# Patient Record
Sex: Male | Born: 1975 | Race: Black or African American | Hispanic: No | Marital: Married | Smoking: Current every day smoker
Health system: Southern US, Community
[De-identification: ages and names within clinical notes are randomized; demographics above are authoritative.]

## PROBLEM LIST (undated history)

## (undated) DIAGNOSIS — K8020 Calculus of gallbladder without cholecystitis without obstruction: Secondary | ICD-10-CM

## (undated) DIAGNOSIS — F4310 Post-traumatic stress disorder, unspecified: Secondary | ICD-10-CM

## (undated) DIAGNOSIS — K760 Fatty (change of) liver, not elsewhere classified: Secondary | ICD-10-CM

## (undated) DIAGNOSIS — B019 Varicella without complication: Secondary | ICD-10-CM

## (undated) DIAGNOSIS — R030 Elevated blood-pressure reading, without diagnosis of hypertension: Principal | ICD-10-CM

## (undated) DIAGNOSIS — K8050 Calculus of bile duct without cholangitis or cholecystitis without obstruction: Secondary | ICD-10-CM

## (undated) DIAGNOSIS — F4321 Adjustment disorder with depressed mood: Secondary | ICD-10-CM

## (undated) DIAGNOSIS — I10 Essential (primary) hypertension: Secondary | ICD-10-CM

## (undated) DIAGNOSIS — E039 Hypothyroidism, unspecified: Secondary | ICD-10-CM

## (undated) HISTORY — PX: HYDROCELE EXCISION / REPAIR: SUR1145

## (undated) HISTORY — PX: FOOT SURGERY: SHX648

## (undated) HISTORY — DX: Hypothyroidism, unspecified: E03.9

## (undated) HISTORY — DX: Essential (primary) hypertension: I10

## (undated) HISTORY — DX: Varicella without complication: B01.9

## (undated) HISTORY — DX: Elevated blood-pressure reading, without diagnosis of hypertension: R03.0

## (undated) HISTORY — DX: Adjustment disorder with depressed mood: F43.21

---

## 2008-01-25 HISTORY — PX: WISDOM TOOTH EXTRACTION: SHX21

## 2011-02-08 ENCOUNTER — Encounter (HOSPITAL_BASED_OUTPATIENT_CLINIC_OR_DEPARTMENT_OTHER): Payer: Self-pay | Admitting: *Deleted

## 2011-02-08 ENCOUNTER — Emergency Department (HOSPITAL_BASED_OUTPATIENT_CLINIC_OR_DEPARTMENT_OTHER)
Admission: EM | Admit: 2011-02-08 | Discharge: 2011-02-08 | Disposition: A | Discharge status: Home / Self Care | Payer: 59 | Attending: Emergency Medicine | Admitting: Emergency Medicine

## 2011-02-08 DIAGNOSIS — H018 Other specified inflammations of eyelid: Secondary | ICD-10-CM | POA: Insufficient documentation

## 2011-02-08 DIAGNOSIS — L089 Local infection of the skin and subcutaneous tissue, unspecified: Secondary | ICD-10-CM

## 2011-02-08 DIAGNOSIS — L723 Sebaceous cyst: Secondary | ICD-10-CM | POA: Insufficient documentation

## 2011-02-08 DIAGNOSIS — R220 Localized swelling, mass and lump, head: Secondary | ICD-10-CM | POA: Insufficient documentation

## 2011-02-08 DIAGNOSIS — H5710 Ocular pain, unspecified eye: Secondary | ICD-10-CM | POA: Insufficient documentation

## 2011-02-08 MED ORDER — NAPROXEN 500 MG PO TABS: 500.0000 mg | ORAL_TABLET | Freq: Two times a day (BID) | ORAL | Status: AC

## 2011-02-08 MED ORDER — LIDOCAINE-EPINEPHRINE 2 %-1:100000 IJ SOLN
INTRAMUSCULAR | Status: AC
  Administered 2011-02-08: 07:00:00
  Filled 2011-02-08: qty 1

## 2011-02-08 NOTE — ED Notes (Signed)
Pt has red swollen area above left eye x 1 week, denies drainage

## 2011-02-08 NOTE — ED Provider Notes (Signed)
History     CSN: [id number]  Arrival date & time 02/08/11  0610   First MD Initiated Contact with Patient 02/08/11 [phone]      Chief Complaint  Patient presents with  . Eye Pain    (Consider location/radiation/quality/duration/timing/severity/associated sxs/prior treatment) HPI Comments: 36 year old male with history of sebaceous cysts in his right ear and his left eyelid. These have been present for years. Approximately 3 weeks ago the right earlobe became infected and required incision and drainage at an outside hospital. The last week he developed gradual onset of gradually worsening increased redness and swelling of the left eyelid which has been tender, gradually increasing in size and increasing in pain. Worse with palpation, not associated with fevers.  Patient is a 36 y.o. male presenting with eye pain. The history is provided by the patient.  Eye Pain Pertinent negatives include no headaches.    History reviewed. No pertinent past medical history.  History reviewed. No pertinent past surgical history.  History reviewed. No pertinent family history.  History  Substance Use Topics  . Smoking status: Former Games developer  . Smokeless tobacco: Not on file  . Alcohol Use: No      Review of Systems  Constitutional: Negative for fever.  HENT: Positive for facial swelling. Negative for sore throat and neck pain.   Eyes: Negative for visual disturbance.  Gastrointestinal: Negative for nausea and vomiting.  Skin:       Abscess  Neurological: Negative for headaches.    Allergies  Review of patient's allergies indicates no known allergies.  Home Medications   Current Outpatient Rx  Name Route Sig Dispense Refill  . NAPROXEN 500 MG PO TABS Oral Take 1 tablet (500 mg total) by mouth 2 (two) times daily with a meal. 30 tablet 0    BP 161/97  Pulse 71  Temp(Src) 98.2 F (36.8 C) (Oral)  Resp 20  Ht 6\' 2"  (1.88 m)  Wt 275 lb (124.739 kg)  BMI 35.31 kg/m2  SpO2  100%  Physical Exam  Constitutional: He appears well-developed and well-nourished. No distress.  HENT:  Head: Normocephalic and atraumatic.  Mouth/Throat: Oropharynx is clear and moist. No oropharyngeal exudate.       Left upper eyelid with what appears to be an infected sebaceous cyst, mild redness, no surrounding erythema of the skin, mild tenderness and fluctuance present  Eyes: Conjunctivae are normal. Right eye exhibits no discharge. Left eye exhibits no discharge. No scleral icterus.       When eyelid opens, normal extraocular movements, vision, pupillary exam bilaterally  Cardiovascular: Normal rate, regular rhythm and normal heart sounds.   Pulmonary/Chest: Effort normal. He has no wheezes. He has no rales.  Musculoskeletal: He exhibits no edema and no tenderness.  Skin: Skin is warm and dry. He is not diaphoretic. There is erythema.       Erythema only involves on the surface of the infected sebaceous cyst    ED Course  Procedures (including critical care time)  INCISION AND DRAINAGE Performed by: Eber Hong D Consent: Verbal consent obtained. Risks and benefits: risks, benefits and alternatives were discussed Type: abscess  Body area: Left upper eyelid, face below eyebrow  Anesthesia: local infiltration  Local anesthetic: lidocaine 1 % with epinephrine  Anesthetic total: 2 ml placed as a supraorbital block  Complexity: complex Blunt dissection to break up loculations and manual removal of infected sebaceous waxy-like material him a drainage of purulent foul-smelling pus   Drainage: purulent  Drainage amount: Mild to  moderate   Packing material: 1/4 in iodoform gauze  Patient tolerance: Patient tolerated the procedure well with no immediate complications.     Labs Reviewed - No data to display No results found.   1. Infected sebaceous cyst       MDM  Patient tolerated procedure without any complaints and no pain. No antibiotics indicated, packing  gauze left in place with instruction for 3 days followup.  No antibiotics indicated at this time to        Vida Roller, MD 02/08/11 [phone]

## 2011-02-08 NOTE — ED Notes (Signed)
I&D performed by Dr Hyacinth Meeker, pt tolerated well. Dressing applied per MD order.

## 2011-02-08 NOTE — ED Notes (Signed)
MD at bedside. 

## 2013-04-04 ENCOUNTER — Encounter: Payer: Self-pay | Admitting: Family Medicine

## 2013-04-04 ENCOUNTER — Ambulatory Visit (INDEPENDENT_AMBULATORY_CARE_PROVIDER_SITE_OTHER): Payer: BC Managed Care – PPO | Admitting: Family Medicine

## 2013-04-04 ENCOUNTER — Telehealth: Payer: Self-pay | Admitting: Family Medicine

## 2013-04-04 VITALS — BP 164/98 | HR 85 | Temp 98.2°F | Ht 74.0 in | Wt 275.0 lb

## 2013-04-04 DIAGNOSIS — E039 Hypothyroidism, unspecified: Secondary | ICD-10-CM

## 2013-04-04 DIAGNOSIS — G4700 Insomnia, unspecified: Secondary | ICD-10-CM

## 2013-04-04 DIAGNOSIS — I10 Essential (primary) hypertension: Secondary | ICD-10-CM

## 2013-04-04 DIAGNOSIS — F4321 Adjustment disorder with depressed mood: Secondary | ICD-10-CM

## 2013-04-04 DIAGNOSIS — E669 Obesity, unspecified: Secondary | ICD-10-CM

## 2013-04-04 DIAGNOSIS — F4320 Adjustment disorder, unspecified: Secondary | ICD-10-CM

## 2013-04-04 DIAGNOSIS — IMO0001 Reserved for inherently not codable concepts without codable children: Secondary | ICD-10-CM

## 2013-04-04 DIAGNOSIS — R030 Elevated blood-pressure reading, without diagnosis of hypertension: Secondary | ICD-10-CM

## 2013-04-04 HISTORY — DX: Adjustment disorder, unspecified: F43.20

## 2013-04-04 HISTORY — DX: Adjustment disorder with depressed mood: F43.21

## 2013-04-04 HISTORY — DX: Reserved for inherently not codable concepts without codable children: IMO0001

## 2013-04-04 HISTORY — DX: Essential (primary) hypertension: I10

## 2013-04-04 LAB — LIPID PANEL
CHOL/HDL RATIO: 3.2 ratio
CHOLESTEROL: 163 mg/dL (ref 0–200)
HDL: 51 mg/dL (ref >39)
LDL Cholesterol: 67 mg/dL (ref 0–99)
Triglycerides: 223 mg/dL — ABNORMAL HIGH (ref <150)
VLDL: 45 mg/dL — ABNORMAL HIGH (ref 0–40)

## 2013-04-04 LAB — RENAL FUNCTION PANEL
ALBUMIN: 4.3 g/dL (ref 3.5–5.2)
BUN: 12 mg/dL (ref 6–23)
CALCIUM: 9.1 mg/dL (ref 8.4–10.5)
CO2: 26 mEq/L (ref 19–32)
CREATININE: 1.02 mg/dL (ref 0.50–1.35)
Chloride: 104 mEq/L (ref 96–112)
GLUCOSE: 102 mg/dL — AB (ref 70–99)
Phosphorus: 3.2 mg/dL (ref 2.3–4.6)
Potassium: 3.8 mEq/L (ref 3.5–5.3)
SODIUM: 141 meq/L (ref 135–145)

## 2013-04-04 LAB — CBC
HCT: 44.8 % (ref 39.0–52.0)
HEMOGLOBIN: 15.3 g/dL (ref 13.0–17.0)
MCH: 28.2 pg (ref 26.0–34.0)
MCHC: 34.2 g/dL (ref 30.0–36.0)
MCV: 82.7 fL (ref 78.0–100.0)
PLATELETS: 267 10*3/uL (ref 150–400)
RBC: 5.42 MIL/uL (ref 4.22–5.81)
RDW: 15.4 % (ref 11.5–15.5)
WBC: 11.6 10*3/uL — ABNORMAL HIGH (ref 4.0–10.5)

## 2013-04-04 LAB — HEPATIC FUNCTION PANEL
ALBUMIN: 4.3 g/dL (ref 3.5–5.2)
ALT: 32 U/L (ref 0–53)
AST: 30 U/L (ref 0–37)
Alkaline Phosphatase: 57 U/L (ref 39–117)
BILIRUBIN DIRECT: 0.1 mg/dL (ref 0.0–0.3)
Indirect Bilirubin: 0.3 mg/dL (ref 0.2–1.2)
Total Bilirubin: 0.4 mg/dL (ref 0.2–1.2)
Total Protein: 7.2 g/dL (ref 6.0–8.3)

## 2013-04-04 LAB — TSH: TSH: 26.499 u[IU]/mL — AB (ref 0.350–4.500)

## 2013-04-04 MED ORDER — CARVEDILOL 3.125 MG PO TABS: 3.1250 mg | ORAL_TABLET | Freq: Two times a day (BID) | ORAL | Status: DC

## 2013-04-04 MED ORDER — ALPRAZOLAM 0.25 MG PO TABS: 0.2500 mg | ORAL_TABLET | Freq: Two times a day (BID) | ORAL | Status: DC | PRN

## 2013-04-04 NOTE — Patient Instructions (Signed)
Hypertension As your heart beats, it forces blood through your arteries. This force is your blood pressure. If the pressure is too high, it is called hypertension (HTN) or high blood pressure. HTN is dangerous because you may have it and not know it. High blood pressure may mean that your heart has to work harder to pump blood. Your arteries may be narrow or stiff. The extra work puts you at risk for heart disease, stroke, and other problems.  Blood pressure consists of two numbers, a higher number over a lower, 110/72, for example. It is stated as "110 over 72." The ideal is below 120 for the top number (systolic) and under 80 for the bottom (diastolic). Write down your blood pressure today. You should pay close attention to your blood pressure if you have certain conditions such as:  Heart failure.  Prior heart attack.  Diabetes  Chronic kidney disease.  Prior stroke.  Multiple risk factors for heart disease. To see if you have HTN, your blood pressure should be measured while you are seated with your arm held at the level of the heart. It should be measured at least twice. A one-time elevated blood pressure reading (especially in the Emergency Department) does not mean that you need treatment. There may be conditions in which the blood pressure is different between your right and left arms. It is important to see your caregiver soon for a recheck. Most people have essential hypertension which means that there is not a specific cause. This type of high blood pressure may be lowered by changing lifestyle factors such as:  Stress.  Smoking.  Lack of exercise.  Excessive weight.  Drug/tobacco/alcohol use.  Eating less salt. Most people do not have symptoms from high blood pressure until it has caused damage to the body. Effective treatment can often prevent, delay or reduce that damage. TREATMENT  When a cause has been identified, treatment for high blood pressure is directed at the  cause. There are a large number of medications to treat HTN. These fall into several categories, and your caregiver will help you select the medicines that are best for you. Medications may have side effects. You should review side effects with your caregiver. If your blood pressure stays high after you have made lifestyle changes or started on medicines,   Your medication(s) may need to be changed.  Other problems may need to be addressed.  Be certain you understand your prescriptions, and know how and when to take your medicine.  Be sure to follow up with your caregiver within the time frame advised (usually within two weeks) to have your blood pressure rechecked and to review your medications.  If you are taking more than one medicine to lower your blood pressure, make sure you know how and at what times they should be taken. Taking two medicines at the same time can result in blood pressure that is too low. SEEK IMMEDIATE MEDICAL CARE IF:  You develop a severe headache, blurred or changing vision, or confusion.  You have unusual weakness or numbness, or a faint feeling.  You have severe chest or abdominal pain, vomiting, or breathing problems. MAKE SURE YOU:   Understand these instructions.  Will watch your condition.  Will get help right away if you are not doing well or get worse. Document Released: 01/10/2005 Document Revised: 04/04/2011 Document Reviewed: 08/31/2007 [facility] Patient Information 2014 [place]. Grief Reaction Grief is a normal response to the death of someone close to you. Feelings  of fear, anger, and guilt can affect almost everyone who loses someone they love. Symptoms of depression are also common. These include problems with sleep, loss of appetite, and lack of energy. These grief reaction symptoms often last for weeks to months after a loss. They may also return during special times that remind you of the person you lost, such as an anniversary or  birthday. Anxiety, insomnia, irritability, and deep depression may last beyond the period of normal grief. If you experience these feelings for 6 months or longer, you may have clinical depression. Clinical depression requires further medical attention. If you think that you have clinical depression, you should contact your caregiver. If you have a history of depression and or a family history of depression, you are at greater risk of clinical depression. You are also at greater risk of developing clinical depression if the loss was traumatic or the loss was of someone with whom you had unresolved issues.  A grief reaction can become complicated by being blocked. This means being unable to cry or express extreme emotions. This may prolong the grieving period and worsen the emotional effects of the loss. Mourning is a natural event in human life. A healthy grief reaction is one that is not blocked . It requires a time of sadness and readjustment.It is very important to share your sorrow and fear with others, especially close friends and family. Professional counselors and clergy can also help you process your grief. Document Released: 01/10/2005 Document Revised: 04/04/2011 Document Reviewed: 09/20/2005 [facility] Patient Information 2014 [place], [place].

## 2013-04-04 NOTE — Telephone Encounter (Signed)
Patient states that he will pick up form at office when completed.

## 2013-04-04 NOTE — Progress Notes (Signed)
Pre visit review using our clinic review tool, if applicable. No additional management support is needed unless otherwise documented below in the visit note. 

## 2013-04-05 MED ORDER — LEVOTHYROXINE SODIUM 50 MCG PO TABS: 50.0000 ug | ORAL_TABLET | Freq: Every day | ORAL | Status: DC

## 2013-04-05 NOTE — Telephone Encounter (Signed)
Pt informed and he voiced understanding.  RX sent to pharmacy

## 2013-04-05 NOTE — Telephone Encounter (Signed)
Message copied by Varney Daily on Fri Apr 05, 2013 12:11 PM ------      Message from: Penni Homans A      Created: Thu Apr 04, 2013  5:36 PM       Notify thyroid is off majorly, needs to start Levothyroxine 50 mg po daily, disp #30, 2 rf ------

## 2013-04-07 ENCOUNTER — Encounter: Payer: Self-pay | Admitting: Family Medicine

## 2013-04-07 DIAGNOSIS — E669 Obesity, unspecified: Secondary | ICD-10-CM | POA: Insufficient documentation

## 2013-04-07 DIAGNOSIS — E039 Hypothyroidism, unspecified: Secondary | ICD-10-CM

## 2013-04-07 DIAGNOSIS — G4700 Insomnia, unspecified: Secondary | ICD-10-CM | POA: Insufficient documentation

## 2013-04-07 HISTORY — DX: Hypothyroidism, unspecified: E03.9

## 2013-04-07 NOTE — Assessment & Plan Note (Signed)
Encouraged DASH diet, decrease po intake and increase exercise as tolerated. Needs 7-8 hours of sleep nightly. Avoid trans fats, eat small, frequent meals every 4-5 hours with lean proteins, complex carbs and healthy fats. Minimize simple carbs, GMO foods. 

## 2013-04-07 NOTE — Assessment & Plan Note (Signed)
Girlfriend and her son were murdered recently and he is distraught today. Is allowed to stay out of work til next visit. Encouraged regular exercise, counseling, healthy diet and may use Alprazolam sparingly.

## 2013-04-07 NOTE — Progress Notes (Signed)
Patient ID: Tony Walters, male   DOB: 26-Dec-1975, 38 y.o.   MRN: [id number] Tony Walters [id number] 12/07/1975 04/07/2013      Progress Note New Patient  Subjective  Chief Complaint  Chief Complaint  Patient presents with  . Establish Care    new patient    HPI  Patient is a 38 year old male in today for routine medical care. This is an urgent new patient appt due to a recent tragedy in his life. His girlfriend and her son of one year. Were found dead in a burning car. He is tearful and has had trouble since then with crying, difficulty concentrating, anxiety attacks. He has been working with great difficulty. When he has anxiety attacks he feels sob/shakey, nauseaous, anxious, chest pain and palpitations. He denies a previous history of depression. Denies HA/congestion/fevers/GI or GU c/o. Taking meds as prescribed  Past Medical History  Diagnosis Date  . Chicken pox as a child  . Elevated BP 04/04/2013  . Grief reaction 04/04/2013  . Unspecified hypothyroidism 04/07/2013    Past Surgical History  Procedure Laterality Date  . Hydrocele excision / repair  37 years old  . Wisdom tooth extraction  2010    Family History  Problem Relation Age of Onset  . Congestive Heart Failure Father   . Hyperlipidemia Father   . Hypertension Father   . Kidney disease Maternal Grandmother   . Diabetes Maternal Grandmother     type 2  . Stroke Maternal Grandmother   . Alcohol abuse Paternal Grandmother   . Pneumonia Paternal Grandfather   . Asthma Brother     History   Social History  . Marital Status: Single    Spouse Name: N/A    Number of Children: N/A  . Years of Education: N/A   Occupational History  . Not on file.   Social History Main Topics  . Smoking status: Former Smoker -- 20.00 packs/day for .8 years    Types: Cigarettes  . Smokeless tobacco: Never Used     Comment: quit in Nov 2014 but started back 03-18-13  . Alcohol Use: Yes     Comment: occasionally  . Drug  Use: No  . Sexual Activity: Not Currently    Partners: Female   Other Topics Concern  . Not on file   Social History Narrative  . No narrative on file    No current outpatient prescriptions on file prior to visit.   No current facility-administered medications on file prior to visit.    No Known Allergies  Review of Systems  Review of Systems  Constitutional: Positive for malaise/fatigue. Negative for fever and chills.  HENT: Negative for congestion, hearing loss and nosebleeds.   Eyes: Negative for discharge.  Respiratory: Negative for cough, sputum production, shortness of breath and wheezing.   Cardiovascular: Negative for chest pain, palpitations and leg swelling.  Gastrointestinal: Negative for heartburn, nausea, vomiting, abdominal pain, diarrhea, constipation and blood in stool.  Genitourinary: Negative for dysuria, urgency, frequency and hematuria.  Musculoskeletal: Negative for back pain, falls and myalgias.  Skin: Negative for rash.  Neurological: Negative for dizziness, tremors, sensory change, focal weakness, loss of consciousness, weakness and headaches.  Endo/Heme/Allergies: Negative for polydipsia. Does not bruise/bleed easily.  Psychiatric/Behavioral: Positive for depression. Negative for suicidal ideas. The patient is nervous/anxious and has insomnia.     Objective  BP 164/98  Pulse 85  Temp(Src) 98.2 F (36.8 C) (Oral)  Ht 6\' 2"  (1.88 m)  Wt  275 lb 0.6 oz (124.757 kg)  BMI 35.30 kg/m2  SpO2 97%  Physical Exam  Physical Exam  Constitutional: He is oriented to person, place, and time and well-developed, well-nourished, and in no distress. No distress.  HENT:  Head: Normocephalic and atraumatic.  Eyes: Conjunctivae are normal.  Neck: Neck supple. No thyromegaly present.  Cardiovascular: Normal rate, regular rhythm and normal heart sounds.   No murmur heard. Pulmonary/Chest: Effort normal and breath sounds normal. No respiratory distress.   Abdominal: He exhibits no distension and no mass. There is no tenderness.  Musculoskeletal: He exhibits no edema.  Neurological: He is alert and oriented to person, place, and time.  Skin: Skin is warm.  Psychiatric: Memory, affect and judgment normal.       Assessment & Plan  Elevated BP Poorly controlled will alter medications, encouraged DASH diet, minimize caffeine and obtain adequate sleep. Report concerning symptoms and follow up as directed and as needed. Carvedilol bid  Grief reaction Girlfriend and her son were murdered recently and he is distraught today. Is allowed to stay out of work til next visit. Encouraged regular exercise, counseling, healthy diet and may use Alprazolam sparingly.  Obesity, unspecified Encouraged DASH diet, decrease po intake and increase exercise as tolerated. Needs 7-8 hours of sleep nightly. Avoid trans fats, eat small, frequent meals every 4-5 hours with lean proteins, complex carbs and healthy fats. Minimize simple carbs, GMO foods.  Unspecified hypothyroidism Significantly increased TSH, started on Levothyroxine.

## 2013-04-07 NOTE — Assessment & Plan Note (Signed)
Poorly controlled will alter medications, encouraged DASH diet, minimize caffeine and obtain adequate sleep. Report concerning symptoms and follow up as directed and as needed. Carvedilol bid

## 2013-04-07 NOTE — Assessment & Plan Note (Signed)
Significantly increased TSH, started on Levothyroxine.

## 2013-04-15 ENCOUNTER — Telehealth: Payer: Self-pay

## 2013-04-15 NOTE — Telephone Encounter (Signed)
This was put in the fax pile and Dr Charlett Blake wrote on the paper that page 5 is for the patient to fill out.

## 2013-04-15 NOTE — Telephone Encounter (Signed)
We need to know when pts last date of work was and date pt was first unable to work.   I left a message on pts vm to return our call

## 2013-04-15 NOTE — Telephone Encounter (Signed)
Per pt his last day of work was 04-05-13, date unable to work 04-06-13. Date to return to work 04-29-13

## 2013-04-18 ENCOUNTER — Telehealth: Payer: Self-pay | Admitting: Family Medicine

## 2013-04-18 ENCOUNTER — Ambulatory Visit (INDEPENDENT_AMBULATORY_CARE_PROVIDER_SITE_OTHER): Payer: BC Managed Care – PPO | Admitting: Family Medicine

## 2013-04-18 ENCOUNTER — Encounter: Payer: Self-pay | Admitting: Family Medicine

## 2013-04-18 VITALS — BP 150/90 | HR 101 | Temp 98.2°F | Ht 74.0 in | Wt 280.1 lb

## 2013-04-18 DIAGNOSIS — F32A Depression, unspecified: Secondary | ICD-10-CM

## 2013-04-18 DIAGNOSIS — R030 Elevated blood-pressure reading, without diagnosis of hypertension: Secondary | ICD-10-CM

## 2013-04-18 DIAGNOSIS — F419 Anxiety disorder, unspecified: Secondary | ICD-10-CM

## 2013-04-18 DIAGNOSIS — I10 Essential (primary) hypertension: Secondary | ICD-10-CM

## 2013-04-18 DIAGNOSIS — F4320 Adjustment disorder, unspecified: Secondary | ICD-10-CM

## 2013-04-18 DIAGNOSIS — F341 Dysthymic disorder: Secondary | ICD-10-CM

## 2013-04-18 DIAGNOSIS — G4700 Insomnia, unspecified: Secondary | ICD-10-CM

## 2013-04-18 DIAGNOSIS — F4321 Adjustment disorder with depressed mood: Secondary | ICD-10-CM

## 2013-04-18 DIAGNOSIS — E039 Hypothyroidism, unspecified: Secondary | ICD-10-CM

## 2013-04-18 DIAGNOSIS — E669 Obesity, unspecified: Secondary | ICD-10-CM

## 2013-04-18 DIAGNOSIS — F329 Major depressive disorder, single episode, unspecified: Secondary | ICD-10-CM

## 2013-04-18 DIAGNOSIS — IMO0001 Reserved for inherently not codable concepts without codable children: Secondary | ICD-10-CM

## 2013-04-18 MED ORDER — CARVEDILOL 6.25 MG PO TABS: 6.2500 mg | ORAL_TABLET | Freq: Two times a day (BID) | ORAL | Status: DC

## 2013-04-18 MED ORDER — ESCITALOPRAM OXALATE 10 MG PO TABS: ORAL_TABLET | ORAL | Status: DC

## 2013-04-18 MED ORDER — ALPRAZOLAM 0.25 MG PO TABS
0.2500 mg | ORAL_TABLET | Freq: Two times a day (BID) | ORAL | Status: DC | PRN
Start: 2013-04-18 — End: 2013-05-07

## 2013-04-18 NOTE — Telephone Encounter (Signed)
Relevant patient education mailed to patient.  

## 2013-04-18 NOTE — Progress Notes (Signed)
Patient ID: Tony Walters, male   DOB: 22-Dec-1975, 38 y.o.   MRN: [id number] Tony Walters [id number] [birth year] 04/18/2013      Progress Note-Follow Up  Subjective  Chief Complaint  Chief Complaint  Patient presents with  . Follow-up    2 week    HPI  Patient is a 38 year old male in today for routine medical care. Continues to struggle with anxiety and depression secondary to the recent death of his girlfriend and her son. Does say Xanax helped when he was feeling overly anxious but only marginally. No suicidal ideation but he does knowledge his mood is low frequently. He has couple trading he gets anxious very easily. Would not be able to function in the work environment presently. Denies any other physical complaints. Denies CP/palp/SOB/HA/congestion/fevers/GI or GU c/o. Taking meds as prescribed  Past Medical History  Diagnosis Date  . Chicken pox as a child  . Elevated BP 04/04/2013  . Grief reaction 04/04/2013  . Unspecified hypothyroidism 04/07/2013    Past Surgical History  Procedure Laterality Date  . Hydrocele excision / repair  38 years old  . Wisdom tooth extraction  2010    Family History  Problem Relation Age of Onset  . Congestive Heart Failure Father   . Hyperlipidemia Father   . Hypertension Father   . Kidney disease Maternal Grandmother   . Diabetes Maternal Grandmother     type 2  . Stroke Maternal Grandmother   . Alcohol abuse Paternal Grandmother   . Pneumonia Paternal Grandfather   . Asthma Brother     History   Social History  . Marital Status: Single    Spouse Name: N/A    Number of Children: N/A  . Years of Education: N/A   Occupational History  . Not on file.   Social History Main Topics  . Smoking status: Current Every Day Smoker -- 20.00 packs/day for .8 years    Types: Cigarettes  . Smokeless tobacco: Never Used     Comment: quit in Nov 2014 but started back 03-18-13  . Alcohol Use: Yes     Comment: occasionally  . Drug Use: No   . Sexual Activity: Not Currently    Partners: Female   Other Topics Concern  . Not on file   Social History Narrative  . No narrative on file    Current Outpatient Prescriptions on File Prior to Visit  Medication Sig Dispense Refill  . levothyroxine (SYNTHROID, LEVOTHROID) 50 MCG tablet Take 1 tablet (50 mcg total) by mouth daily.  30 tablet  2   No current facility-administered medications on file prior to visit.    No Known Allergies  Review of Systems  Review of Systems  Constitutional: Negative for fever and malaise/fatigue.  HENT: Negative for congestion.   Eyes: Negative for discharge.  Respiratory: Negative for shortness of breath.   Cardiovascular: Negative for chest pain, palpitations and leg swelling.  Gastrointestinal: Negative for nausea, abdominal pain and diarrhea.  Genitourinary: Negative for dysuria.  Musculoskeletal: Negative for falls.  Skin: Negative for rash.  Neurological: Negative for loss of consciousness and headaches.  Endo/Heme/Allergies: Negative for polydipsia.  Psychiatric/Behavioral: Positive for depression. Negative for suicidal ideas. The patient is nervous/anxious and has insomnia.     Objective  BP 150/90  Pulse 101  Temp(Src) 98.2 F (36.8 C) (Oral)  Ht 6\' 2"  (1.88 m)  Wt 280 lb 1.3 oz (127.043 kg)  BMI 35.94 kg/m2  SpO2 96%  Physical Exam  Physical Exam  Lab Results  Component Value Date   TSH 26.499* 04/04/2013   Lab Results  Component Value Date   WBC 11.6* 04/04/2013   HGB 15.3 04/04/2013   HCT 44.8 04/04/2013   MCV 82.7 04/04/2013   PLT 267 04/04/2013   Lab Results  Component Value Date   CREATININE 1.02 04/04/2013   BUN 12 04/04/2013   NA 141 04/04/2013   K 3.8 04/04/2013   CL 104 04/04/2013   CO2 26 04/04/2013   Lab Results  Component Value Date   ALT 32 04/04/2013   AST 30 04/04/2013   ALKPHOS 57 04/04/2013   BILITOT 0.4 04/04/2013   Lab Results  Component Value Date   CHOL 163 04/04/2013   Lab Results   Component Value Date   HDL 51 04/04/2013   Lab Results  Component Value Date   LDLCALC 67 04/04/2013   Lab Results  Component Value Date   TRIG 223* 04/04/2013   Lab Results  Component Value Date   CHOLHDL 3.2 04/04/2013     Assessment & Plan   Elevated BP Improving will increase Carvedilol to 6.25 mg bid.   Grief reaction Found Alprazolam marginally helpful. Is still feeling overwhelmed by recent circumstance. Agrees to start Lexapro 5 to 10 mg daily and may use Alprazolam prn  Insomnia Encouraged good sleep hygiene such as dark, quiet room. No blue/green glowing lights such as computer screens in bedroom. No alcohol or stimulants in evening. Cut down on caffeine as able. Regular exercise is helpful but not just prior to bed time.   Obesity, unspecified Encouraged DASH diet, decrease po intake and increase exercise as tolerated. Needs 7-8 hours of sleep nightly. Avoid trans fats, eat small, frequent meals every 4-5 hours with lean proteins, complex carbs and healthy fats. Minimize simple carbs, GMO foods.  Unspecified hypothyroidism Recently diagnosed, just started on Levothyroxine. Will monitor

## 2013-04-18 NOTE — Progress Notes (Signed)
Pre visit review using our clinic review tool, if applicable. No additional management support is needed unless otherwise documented below in the visit note. 

## 2013-04-21 NOTE — Assessment & Plan Note (Signed)
Encouraged DASH diet, decrease po intake and increase exercise as tolerated. Needs 7-8 hours of sleep nightly. Avoid trans fats, eat small, frequent meals every 4-5 hours with lean proteins, complex carbs and healthy fats. Minimize simple carbs, GMO foods. 

## 2013-04-21 NOTE — Assessment & Plan Note (Signed)
Found Alprazolam marginally helpful. Is still feeling overwhelmed by recent circumstance. Agrees to start Lexapro 5 to 10 mg daily and may use Alprazolam prn

## 2013-04-21 NOTE — Assessment & Plan Note (Signed)
Encouraged good sleep hygiene such as dark, quiet room. No blue/green glowing lights such as computer screens in bedroom. No alcohol or stimulants in evening. Cut down on caffeine as able. Regular exercise is helpful but not just prior to bed time.  

## 2013-04-21 NOTE — Assessment & Plan Note (Signed)
Improving will increase Carvedilol to 6.25 mg bid.

## 2013-04-21 NOTE — Assessment & Plan Note (Signed)
Recently diagnosed, just started on Levothyroxine. Will monitor

## 2013-05-06 ENCOUNTER — Telehealth: Payer: Self-pay

## 2013-05-06 ENCOUNTER — Ambulatory Visit: Payer: BC Managed Care – PPO | Admitting: Family Medicine

## 2013-05-06 NOTE — Telephone Encounter (Signed)
Paperwork refaxed and pt informed

## 2013-05-06 NOTE — Telephone Encounter (Signed)
Patient left a message stating he was supposed to be seen this morning but couldn't make it to appt?  Pt also stated that his job is saying they didn't get the paperwork that him and Dr Charlett Blake filled out?

## 2013-05-07 ENCOUNTER — Ambulatory Visit (INDEPENDENT_AMBULATORY_CARE_PROVIDER_SITE_OTHER): Payer: BC Managed Care – PPO | Admitting: Family Medicine

## 2013-05-07 ENCOUNTER — Encounter: Payer: Self-pay | Admitting: Family Medicine

## 2013-05-07 VITALS — BP 160/120 | HR 75 | Temp 98.6°F | Ht 74.0 in | Wt 265.0 lb

## 2013-05-07 DIAGNOSIS — E669 Obesity, unspecified: Secondary | ICD-10-CM

## 2013-05-07 DIAGNOSIS — I10 Essential (primary) hypertension: Secondary | ICD-10-CM

## 2013-05-07 DIAGNOSIS — F418 Other specified anxiety disorders: Secondary | ICD-10-CM

## 2013-05-07 DIAGNOSIS — F4321 Adjustment disorder with depressed mood: Secondary | ICD-10-CM

## 2013-05-07 DIAGNOSIS — F341 Dysthymic disorder: Secondary | ICD-10-CM

## 2013-05-07 DIAGNOSIS — E039 Hypothyroidism, unspecified: Secondary | ICD-10-CM

## 2013-05-07 MED ORDER — LORAZEPAM 1 MG PO TABS: ORAL_TABLET | ORAL | Status: DC

## 2013-05-07 NOTE — Progress Notes (Signed)
Pre visit review using our clinic review tool, if applicable. No additional management support is needed unless otherwise documented below in the visit note. 

## 2013-05-07 NOTE — Progress Notes (Signed)
Patient ID: Tony Walters, male   DOB: [birth year], 38 y.o.   MRN: [id number] DURWARD MATRANGA [id number] [birth year] 05/07/2013      Progress Note-Follow Up  Subjective  Chief Complaint  Chief Complaint  Patient presents with  . Follow-up    2 week    HPI  Patient is a 38 year old male in today for routine medical care. Is tolerating Lexapro but does not feel the Xanax is helping. No suicidal ideation but he continues to struggle with panic attacks. No recent illness. Denies CP/palp/SOB/HA/congestion/fevers/GI or GU c/o. Taking meds as prescribed  Past Medical History  Diagnosis Date  . Chicken pox as a child  . Elevated BP 04/04/2013  . Grief reaction 04/04/2013  . Unspecified hypothyroidism 04/07/2013    Past Surgical History  Procedure Laterality Date  . Hydrocele excision / repair  38 years old  . Wisdom tooth extraction  2010    Family History  Problem Relation Age of Onset  . Congestive Heart Failure Father   . Hyperlipidemia Father   . Hypertension Father   . Kidney disease Maternal Grandmother   . Diabetes Maternal Grandmother     type 2  . Stroke Maternal Grandmother   . Alcohol abuse Paternal Grandmother   . Pneumonia Paternal Grandfather   . Asthma Brother     History   Social History  . Marital Status: Single    Spouse Name: N/A    Number of Children: N/A  . Years of Education: N/A   Occupational History  . Not on file.   Social History Main Topics  . Smoking status: Current Every Day Smoker -- 20.00 packs/day for .8 years    Types: Cigarettes  . Smokeless tobacco: Never Used     Comment: quit in Nov 2014 but started back 03-18-13  . Alcohol Use: Yes     Comment: occasionally  . Drug Use: No  . Sexual Activity: Not Currently    Partners: Female   Other Topics Concern  . Not on file   Social History Narrative  . No narrative on file    Current Outpatient Prescriptions on File Prior to Visit  Medication Sig Dispense Refill  . ALPRAZolam  (XANAX) 0.25 MG tablet Take 1 tablet (0.25 mg total) by mouth 2 (two) times daily as needed for anxiety or sleep.  45 tablet  1  . carvedilol (COREG) 6.25 MG tablet Take 1 tablet (6.25 mg total) by mouth 2 (two) times daily with a meal.  60 tablet  3  . escitalopram (LEXAPRO) 10 MG tablet 1/2 tab po daily x 7 days then 1 tab po daily  30 tablet  2  . levothyroxine (SYNTHROID, LEVOTHROID) 50 MCG tablet Take 1 tablet (50 mcg total) by mouth daily.  30 tablet  2   No current facility-administered medications on file prior to visit.    No Known Allergies  Review of Systems  Review of Systems  Constitutional: Negative for fever and malaise/fatigue.  HENT: Negative for congestion.   Eyes: Negative for discharge.  Respiratory: Negative for shortness of breath.   Cardiovascular: Negative for chest pain, palpitations and leg swelling.  Gastrointestinal: Negative for nausea, abdominal pain and diarrhea.  Genitourinary: Negative for dysuria.  Musculoskeletal: Negative for falls.  Skin: Negative for rash.  Neurological: Negative for loss of consciousness and headaches.  Endo/Heme/Allergies: Negative for polydipsia.  Psychiatric/Behavioral: Positive for depression. Negative for suicidal ideas. The patient is nervous/anxious and has insomnia.  Objective  BP 160/120  Pulse 75  Temp(Src) 98.6 F (37 C) (Oral)  Ht 6\' 2"  (1.88 m)  Wt 265 lb (120.203 kg)  BMI 34.01 kg/m2  SpO2 96%  Physical Exam  Physical Exam  Constitutional: He is oriented to person, place, and time and well-developed, well-nourished, and in no distress. No distress.  HENT:  Head: Normocephalic and atraumatic.  Eyes: Conjunctivae are normal.  Neck: Neck supple. No thyromegaly present.  Cardiovascular: Normal rate, regular rhythm and normal heart sounds.   No murmur heard. Pulmonary/Chest: Effort normal and breath sounds normal. No respiratory distress.  Abdominal: He exhibits no distension and no mass. There is no  tenderness.  Musculoskeletal: He exhibits no edema.  Neurological: He is alert and oriented to person, place, and time.  Skin: Skin is warm.  Psychiatric: Memory, affect and judgment normal.    Lab Results  Component Value Date   TSH 26.499* 04/04/2013   Lab Results  Component Value Date   WBC 11.6* 04/04/2013   HGB 15.3 04/04/2013   HCT 44.8 04/04/2013   MCV 82.7 04/04/2013   PLT 267 04/04/2013   Lab Results  Component Value Date   CREATININE 1.02 04/04/2013   BUN 12 04/04/2013   NA 141 04/04/2013   K 3.8 04/04/2013   CL 104 04/04/2013   CO2 26 04/04/2013   Lab Results  Component Value Date   ALT 32 04/04/2013   AST 30 04/04/2013   ALKPHOS 57 04/04/2013   BILITOT 0.4 04/04/2013   Lab Results  Component Value Date   CHOL 163 04/04/2013   Lab Results  Component Value Date   HDL 51 04/04/2013   Lab Results  Component Value Date   LDLCALC 67 04/04/2013   Lab Results  Component Value Date   TRIG 223* 04/04/2013   Lab Results  Component Value Date   CHOLHDL 3.2 04/04/2013     Assessment & Plan  HTN (hypertension) Poorly controlled will alter medications, encouraged DASH diet, minimize caffeine and obtain adequate sleep. Report concerning symptoms and follow up as directed and as needed. Increase Carvedilol  Obesity, unspecified Encouraged DASH diet, decrease po intake and increase exercise as tolerated. Needs 7-8 hours of sleep nightly. Avoid trans fats, eat small, frequent meals every 4-5 hours with lean proteins, complex carbs and healthy fats. Minimize simple carbs, GMO foods.  Unspecified hypothyroidism Newly diagnosed, tolerating Levothyroxine  Depression with anxiety Tolerating Lexapro. Not responding to Xanax, will try Ativan.

## 2013-05-09 ENCOUNTER — Ambulatory Visit: Payer: BC Managed Care – PPO | Admitting: Family Medicine

## 2013-05-09 DIAGNOSIS — F418 Other specified anxiety disorders: Secondary | ICD-10-CM | POA: Insufficient documentation

## 2013-05-12 ENCOUNTER — Encounter: Payer: Self-pay | Admitting: Family Medicine

## 2013-05-12 NOTE — Assessment & Plan Note (Signed)
Tolerating Lexapro. Not responding to Xanax, will try Ativan.

## 2013-05-12 NOTE — Assessment & Plan Note (Signed)
Poorly controlled will alter medications, encouraged DASH diet, minimize caffeine and obtain adequate sleep. Report concerning symptoms and follow up as directed and as needed. Increase Carvedilol 

## 2013-05-12 NOTE — Assessment & Plan Note (Signed)
Encouraged DASH diet, decrease po intake and increase exercise as tolerated. Needs 7-8 hours of sleep nightly. Avoid trans fats, eat small, frequent meals every 4-5 hours with lean proteins, complex carbs and healthy fats. Minimize simple carbs, GMO foods. 

## 2013-05-12 NOTE — Assessment & Plan Note (Signed)
Newly diagnosed, tolerating Levothyroxine

## 2013-06-10 ENCOUNTER — Ambulatory Visit (INDEPENDENT_AMBULATORY_CARE_PROVIDER_SITE_OTHER): Payer: Self-pay | Admitting: Family Medicine

## 2013-06-10 ENCOUNTER — Telehealth: Payer: Self-pay | Admitting: Family Medicine

## 2013-06-10 ENCOUNTER — Encounter: Payer: Self-pay | Admitting: Family Medicine

## 2013-06-10 VITALS — BP 162/110 | HR 64 | Temp 99.1°F | Ht 74.0 in | Wt 266.1 lb

## 2013-06-10 DIAGNOSIS — E669 Obesity, unspecified: Secondary | ICD-10-CM

## 2013-06-10 DIAGNOSIS — Z23 Encounter for immunization: Secondary | ICD-10-CM

## 2013-06-10 DIAGNOSIS — F329 Major depressive disorder, single episode, unspecified: Secondary | ICD-10-CM

## 2013-06-10 DIAGNOSIS — F341 Dysthymic disorder: Secondary | ICD-10-CM

## 2013-06-10 DIAGNOSIS — F32A Depression, unspecified: Secondary | ICD-10-CM

## 2013-06-10 DIAGNOSIS — F418 Other specified anxiety disorders: Secondary | ICD-10-CM

## 2013-06-10 DIAGNOSIS — E039 Hypothyroidism, unspecified: Secondary | ICD-10-CM

## 2013-06-10 DIAGNOSIS — I10 Essential (primary) hypertension: Secondary | ICD-10-CM

## 2013-06-10 DIAGNOSIS — F419 Anxiety disorder, unspecified: Secondary | ICD-10-CM

## 2013-06-10 MED ORDER — ESCITALOPRAM OXALATE 10 MG PO TABS: 10.0000 mg | ORAL_TABLET | Freq: Every day | ORAL | Status: DC

## 2013-06-10 MED ORDER — HYDROCHLOROTHIAZIDE 25 MG PO TABS: 25.0000 mg | ORAL_TABLET | Freq: Every day | ORAL | Status: DC

## 2013-06-10 MED ORDER — TETANUS-DIPHTH-ACELL PERTUSSIS 5-2.5-18.5 LF-MCG/0.5 IM SUSP: 0.5000 mL | Freq: Once | INTRAMUSCULAR | Status: DC

## 2013-06-10 MED ORDER — CLONAZEPAM 1 MG PO TABS: ORAL_TABLET | ORAL | Status: DC

## 2013-06-10 NOTE — Progress Notes (Signed)
Patient ID: Tony Walters, male   DOB: May 03, 1975, 38 y.o.   MRN: [id number] Tony Walters [id number] 12-16-1975 06/10/2013      Progress Note-Follow Up  Subjective  Chief Complaint  Chief Complaint  Patient presents with  . Follow-up  . Injections    tdap    HPI  Patient is a 38 year old male in today for routine medical care. He is in today for followup. He did take his blood pressure medicines today. He continues to struggle with anxiety and grief. He acknowledges he is becoming more irritable. He only took Lexapro for a few weeks and then stopped because he did not think was helping. He had some diarrhea initially but it resolved. No suicidal ideation. Denies CP/palp/SOB/HA/congestion/fevers/GI or GU c/o. Taking meds as prescribed Past Medical History  Diagnosis Date  . Chicken pox as a child  . Elevated BP 04/04/2013  . Grief reaction 04/04/2013  . Unspecified hypothyroidism 04/07/2013  . HTN (hypertension) 04/04/2013    Past Surgical History  Procedure Laterality Date  . Hydrocele excision / repair  38 years old  . Wisdom tooth extraction  2010    Family History  Problem Relation Age of Onset  . Congestive Heart Failure Father   . Hyperlipidemia Father   . Hypertension Father   . Kidney disease Maternal Grandmother   . Diabetes Maternal Grandmother     type 2  . Stroke Maternal Grandmother   . Alcohol abuse Paternal Grandmother   . Pneumonia Paternal Grandfather   . Asthma Brother     History   Social History  . Marital Status: Single    Spouse Name: N/A    Number of Children: N/A  . Years of Education: N/A   Occupational History  . Not on file.   Social History Main Topics  . Smoking status: Current Every Day Smoker -- 20.00 packs/day for .8 years    Types: Cigarettes  . Smokeless tobacco: Never Used     Comment: quit in Nov 2014 but started back 03-18-13  . Alcohol Use: Yes     Comment: occasionally  . Drug Use: No  . Sexual Activity: Not Currently     Partners: Female   Other Topics Concern  . Not on file   Social History Narrative  . No narrative on file    Current Outpatient Prescriptions on File Prior to Visit  Medication Sig Dispense Refill  . carvedilol (COREG) 6.25 MG tablet Take 1 tablet (6.25 mg total) by mouth 2 (two) times daily with a meal.  60 tablet  3  . escitalopram (LEXAPRO) 10 MG tablet 1/2 tab po daily x 7 days then 1 tab po daily  30 tablet  2  . levothyroxine (SYNTHROID, LEVOTHROID) 50 MCG tablet Take 1 tablet (50 mcg total) by mouth daily.  30 tablet  2  . LORazepam (ATIVAN) 1 MG tablet 1/2  To 2 tabs po bid prn anxiety, insomnia  40 tablet  1   No current facility-administered medications on file prior to visit.    No Known Allergies  Review of Systems  Review of Systems  Constitutional: Negative for fever and malaise/fatigue.  HENT: Negative for congestion.   Eyes: Negative for discharge.  Respiratory: Negative for shortness of breath.   Cardiovascular: Negative for chest pain, palpitations and leg swelling.  Gastrointestinal: Negative for nausea, abdominal pain and diarrhea.  Genitourinary: Negative for dysuria.  Musculoskeletal: Negative for falls.  Skin: Negative for rash.  Neurological: Negative  for loss of consciousness and headaches.  Endo/Heme/Allergies: Negative for polydipsia.  Psychiatric/Behavioral: Negative for depression and suicidal ideas. The patient is not nervous/anxious and does not have insomnia.     Objective  BP 162/110  Pulse 64  Temp(Src) 99.1 F (37.3 C) (Oral)  Ht 6\' 2"  (1.88 m)  Wt 266 lb 1.3 oz (120.693 kg)  BMI 34.15 kg/m2  SpO2 97%  Physical Exam  Physical Exam  Constitutional: He is oriented to person, place, and time and well-developed, well-nourished, and in no distress. No distress.  HENT:  Head: Normocephalic and atraumatic.  Eyes: Conjunctivae are normal.  Neck: Neck supple. No thyromegaly present.  Cardiovascular: Normal rate, regular rhythm and  normal heart sounds.   No murmur heard. Pulmonary/Chest: Effort normal and breath sounds normal. No respiratory distress.  Abdominal: He exhibits no distension and no mass. There is no tenderness.  Musculoskeletal: He exhibits no edema.  Neurological: He is alert and oriented to person, place, and time.  Skin: Skin is warm.  Psychiatric: Memory, affect and judgment normal.    Lab Results  Component Value Date   TSH 26.499* 04/04/2013   Lab Results  Component Value Date   WBC 11.6* 04/04/2013   HGB 15.3 04/04/2013   HCT 44.8 04/04/2013   MCV 82.7 04/04/2013   PLT 267 04/04/2013   Lab Results  Component Value Date   CREATININE 1.02 04/04/2013   BUN 12 04/04/2013   NA 141 04/04/2013   K 3.8 04/04/2013   CL 104 04/04/2013   CO2 26 04/04/2013   Lab Results  Component Value Date   ALT 32 04/04/2013   AST 30 04/04/2013   ALKPHOS 57 04/04/2013   BILITOT 0.4 04/04/2013   Lab Results  Component Value Date   CHOL 163 04/04/2013   Lab Results  Component Value Date   HDL 51 04/04/2013   Lab Results  Component Value Date   LDLCALC 67 04/04/2013   Lab Results  Component Value Date   TRIG 223* 04/04/2013   Lab Results  Component Value Date   CHOLHDL 3.2 04/04/2013     Assessment & Plan   HTN (hypertension) Add HCTZ 25 mg daily. Poorly controlled will alter medications, encouraged DASH diet, minimize caffeine and obtain adequate sleep. Report concerning symptoms and follow up as directed and as needed  Obesity, unspecified Encouraged DASH diet, decrease po intake and increase exercise as tolerated. Needs 7-8 hours of sleep nightly. Avoid trans fats, eat small, frequent meals every 4-5 hours with lean proteins, complex carbs and healthy fats. Minimize simple carbs, GMO foods.  Unspecified hypothyroidism On Levothyroxine, continue to monitor  Depression with anxiety Only took the Lexapro for a couple weeks so did not see any improvement. Did have trouble with loose stool initially  but that resolved. Will restart Lexapro

## 2013-06-10 NOTE — Telephone Encounter (Signed)
Relevant patient education mailed to patient.  

## 2013-06-10 NOTE — Progress Notes (Signed)
Pre visit review using our clinic review tool, if applicable. No additional management support is needed unless otherwise documented below in the visit note. 

## 2013-06-10 NOTE — Assessment & Plan Note (Addendum)
Add HCTZ 25 mg daily. Poorly controlled will alter medications, encouraged DASH diet, minimize caffeine and obtain adequate sleep. Report concerning symptoms and follow up as directed and as needed

## 2013-06-10 NOTE — Patient Instructions (Signed)

## 2013-06-12 NOTE — Assessment & Plan Note (Signed)
On Levothyroxine, continue to monitor 

## 2013-06-12 NOTE — Assessment & Plan Note (Signed)
Encouraged DASH diet, decrease po intake and increase exercise as tolerated. Needs 7-8 hours of sleep nightly. Avoid trans fats, eat small, frequent meals every 4-5 hours with lean proteins, complex carbs and healthy fats. Minimize simple carbs, GMO foods. 

## 2013-06-12 NOTE — Assessment & Plan Note (Signed)
Only took the Lexapro for a couple weeks so did not see any improvement. Did have trouble with loose stool initially but that resolved. Will restart Lexapro

## 2013-06-14 ENCOUNTER — Telehealth: Payer: Self-pay

## 2013-06-14 NOTE — Telephone Encounter (Signed)
I wrote down June 1 on my paper. i misunderstood I guess. He should have his work fax [place] over another copy or the old one and I can just addend of if they will accept [place] just changing the date then I can update the one we did and we can refax it. Sorry

## 2013-06-14 NOTE — Telephone Encounter (Signed)
Pt called and wanted to let Dr Charlett Blake know that he wanted to return to work on June 15th. He states he gave Dr Charlett Blake his extension papers on Monday.

## 2013-06-14 NOTE — Telephone Encounter (Signed)
Called pt, LMOM to CB. 

## 2013-06-20 ENCOUNTER — Telehealth: Payer: Self-pay

## 2013-06-20 NOTE — Telephone Encounter (Signed)
Pt left a message stating that the short term company needs more information for his leave from work?  I left a message for patient to have them fax [place] the information they need so we can release the information they need

## 2013-06-28 ENCOUNTER — Telehealth: Payer: Self-pay

## 2013-06-28 NOTE — Telephone Encounter (Signed)
fyi  Pt called stating that the disability office is stating that they didn't receive the paperwork from Dr Charlett Blake. I explained to patient that they would need to resend it and have it dated that they the information from 06-10-13 appt not 06-12-13 because we don't have anything from the 20th.  Pt read me fax # and states he was going to call them back and have them fax the correct papers.  Pt also stated that he wants to wait until the 15th to return to work. Pt wants to see how the clonodine is working

## 2013-07-02 ENCOUNTER — Ambulatory Visit (INDEPENDENT_AMBULATORY_CARE_PROVIDER_SITE_OTHER): Payer: BC Managed Care – PPO | Admitting: Family Medicine

## 2013-07-02 ENCOUNTER — Other Ambulatory Visit: Payer: Self-pay | Admitting: Family Medicine

## 2013-07-02 ENCOUNTER — Encounter: Payer: Self-pay | Admitting: Family Medicine

## 2013-07-02 VITALS — BP 150/92 | HR 84 | Temp 98.4°F | Ht 74.0 in | Wt 266.0 lb

## 2013-07-02 DIAGNOSIS — F341 Dysthymic disorder: Secondary | ICD-10-CM

## 2013-07-02 DIAGNOSIS — I10 Essential (primary) hypertension: Secondary | ICD-10-CM

## 2013-07-02 DIAGNOSIS — D729 Disorder of white blood cells, unspecified: Secondary | ICD-10-CM

## 2013-07-02 DIAGNOSIS — E039 Hypothyroidism, unspecified: Secondary | ICD-10-CM

## 2013-07-02 DIAGNOSIS — F329 Major depressive disorder, single episode, unspecified: Secondary | ICD-10-CM

## 2013-07-02 DIAGNOSIS — F419 Anxiety disorder, unspecified: Secondary | ICD-10-CM

## 2013-07-02 DIAGNOSIS — F4321 Adjustment disorder with depressed mood: Secondary | ICD-10-CM

## 2013-07-02 DIAGNOSIS — F32A Depression, unspecified: Secondary | ICD-10-CM

## 2013-07-02 LAB — CBC
HEMATOCRIT: 45.2 % (ref 39.0–52.0)
HEMOGLOBIN: 15.9 g/dL (ref 13.0–17.0)
MCH: 28.5 pg (ref 26.0–34.0)
MCHC: 35.2 g/dL (ref 30.0–36.0)
MCV: 81.1 fL (ref 78.0–100.0)
Platelets: 257 10*3/uL (ref 150–400)
RBC: 5.57 MIL/uL (ref 4.22–5.81)
RDW: 14.7 % (ref 11.5–15.5)
WBC: 12.6 10*3/uL — AB (ref 4.0–10.5)

## 2013-07-02 LAB — RENAL FUNCTION PANEL
Albumin: 4.2 g/dL (ref 3.5–5.2)
BUN: 10 mg/dL (ref 6–23)
CHLORIDE: 103 meq/L (ref 96–112)
CO2: 25 meq/L (ref 19–32)
CREATININE: 0.99 mg/dL (ref 0.50–1.35)
Calcium: 9.3 mg/dL (ref 8.4–10.5)
GLUCOSE: 96 mg/dL (ref 70–99)
POTASSIUM: 3.9 meq/L (ref 3.5–5.3)
Phosphorus: 3.1 mg/dL (ref 2.3–4.6)
Sodium: 137 mEq/L (ref 135–145)

## 2013-07-02 LAB — TSH: TSH: 5.949 u[IU]/mL — AB (ref 0.350–4.500)

## 2013-07-02 MED ORDER — CARVEDILOL 6.25 MG PO TABS: 6.2500 mg | ORAL_TABLET | Freq: Two times a day (BID) | ORAL | Status: DC

## 2013-07-02 MED ORDER — ESCITALOPRAM OXALATE 10 MG PO TABS: 10.0000 mg | ORAL_TABLET | Freq: Every day | ORAL | Status: DC

## 2013-07-02 MED ORDER — CLONAZEPAM 1 MG PO TABS: ORAL_TABLET | ORAL | Status: DC

## 2013-07-02 MED ORDER — HYDROCHLOROTHIAZIDE 25 MG PO TABS: 25.0000 mg | ORAL_TABLET | Freq: Every day | ORAL | Status: DC

## 2013-07-02 NOTE — Progress Notes (Signed)
Pre visit review using our clinic review tool, if applicable. No additional management support is needed unless otherwise documented below in the visit note. 

## 2013-07-02 NOTE — Patient Instructions (Signed)

## 2013-07-08 ENCOUNTER — Telehealth: Payer: Self-pay | Admitting: Family Medicine

## 2013-07-08 ENCOUNTER — Emergency Department (HOSPITAL_BASED_OUTPATIENT_CLINIC_OR_DEPARTMENT_OTHER)
Admission: EM | Admit: 2013-07-08 | Discharge: 2013-07-08 | Disposition: A | Discharge status: Home / Self Care | Payer: BC Managed Care – PPO | Attending: Emergency Medicine | Admitting: Emergency Medicine

## 2013-07-08 ENCOUNTER — Encounter (HOSPITAL_BASED_OUTPATIENT_CLINIC_OR_DEPARTMENT_OTHER): Payer: Self-pay | Admitting: Emergency Medicine

## 2013-07-08 DIAGNOSIS — E039 Hypothyroidism, unspecified: Secondary | ICD-10-CM | POA: Insufficient documentation

## 2013-07-08 DIAGNOSIS — Z008 Encounter for other general examination: Secondary | ICD-10-CM

## 2013-07-08 DIAGNOSIS — Z8619 Personal history of other infectious and parasitic diseases: Secondary | ICD-10-CM | POA: Insufficient documentation

## 2013-07-08 DIAGNOSIS — F17200 Nicotine dependence, unspecified, uncomplicated: Secondary | ICD-10-CM | POA: Insufficient documentation

## 2013-07-08 DIAGNOSIS — F341 Dysthymic disorder: Secondary | ICD-10-CM | POA: Insufficient documentation

## 2013-07-08 DIAGNOSIS — I10 Essential (primary) hypertension: Secondary | ICD-10-CM | POA: Insufficient documentation

## 2013-07-08 DIAGNOSIS — Z79899 Other long term (current) drug therapy: Secondary | ICD-10-CM | POA: Insufficient documentation

## 2013-07-08 DIAGNOSIS — R5381 Other malaise: Secondary | ICD-10-CM | POA: Insufficient documentation

## 2013-07-08 DIAGNOSIS — F418 Other specified anxiety disorders: Secondary | ICD-10-CM

## 2013-07-08 DIAGNOSIS — R5383 Other fatigue: Secondary | ICD-10-CM

## 2013-07-08 LAB — BASIC METABOLIC PANEL
BUN: 13 mg/dL (ref 6–23)
CALCIUM: 10.1 mg/dL (ref 8.4–10.5)
CHLORIDE: 97 meq/L (ref 96–112)
CO2: 31 meq/L (ref 19–32)
CREATININE: 1.2 mg/dL (ref 0.50–1.35)
GFR calc non Af Amer: 75 mL/min — ABNORMAL LOW (ref >90)
GFR, EST AFRICAN AMERICAN: 87 mL/min — AB (ref >90)
Glucose, Bld: 112 mg/dL — ABNORMAL HIGH (ref 70–99)
Potassium: 3.9 mEq/L (ref 3.7–5.3)
Sodium: 140 mEq/L (ref 137–147)

## 2013-07-08 LAB — CBC WITH DIFFERENTIAL/PLATELET
BASOS ABS: 0 10*3/uL (ref 0.0–0.1)
BASOS PCT: 0 % (ref 0–1)
EOS PCT: 1 % (ref 0–5)
Eosinophils Absolute: 0.1 10*3/uL (ref 0.0–0.7)
HEMATOCRIT: 47.4 % (ref 39.0–52.0)
HEMOGLOBIN: 16.4 g/dL (ref 13.0–17.0)
Lymphocytes Relative: 32 % (ref 12–46)
Lymphs Abs: 3 10*3/uL (ref 0.7–4.0)
MCH: 28.9 pg (ref 26.0–34.0)
MCHC: 34.6 g/dL (ref 30.0–36.0)
MCV: 83.5 fL (ref 78.0–100.0)
MONO ABS: 0.8 10*3/uL (ref 0.1–1.0)
MONOS PCT: 8 % (ref 3–12)
NEUTROS ABS: 5.4 10*3/uL (ref 1.7–7.7)
Neutrophils Relative %: 58 % (ref 43–77)
Platelets: 242 10*3/uL (ref 150–400)
RBC: 5.68 MIL/uL (ref 4.22–5.81)
RDW: 14.2 % (ref 11.5–15.5)
WBC: 9.3 10*3/uL (ref 4.0–10.5)

## 2013-07-08 LAB — URINALYSIS, ROUTINE W REFLEX MICROSCOPIC
BILIRUBIN URINE: NEGATIVE
GLUCOSE, UA: NEGATIVE mg/dL
HGB URINE DIPSTICK: NEGATIVE
Ketones, ur: NEGATIVE mg/dL
Leukocytes, UA: NEGATIVE
Nitrite: NEGATIVE
Protein, ur: NEGATIVE mg/dL
SPECIFIC GRAVITY, URINE: 1.016 (ref 1.005–1.030)
Urobilinogen, UA: 1 mg/dL (ref 0.0–1.0)
pH: 5 (ref 5.0–8.0)

## 2013-07-08 LAB — RAPID URINE DRUG SCREEN, HOSP PERFORMED
AMPHETAMINES: NOT DETECTED
BARBITURATES: NOT DETECTED
Benzodiazepines: POSITIVE — AB
COCAINE: NOT DETECTED
OPIATES: NOT DETECTED
TETRAHYDROCANNABINOL: POSITIVE — AB

## 2013-07-08 LAB — SALICYLATE LEVEL: Salicylate Lvl: <2 mg/dL — ABNORMAL LOW (ref 2.8–20.0)

## 2013-07-08 LAB — ACETAMINOPHEN LEVEL

## 2013-07-08 LAB — ETHANOL: Alcohol, Ethyl (B): <11 mg/dL (ref 0–11)

## 2013-07-08 NOTE — ED Notes (Signed)
Reports have suicidal thought with no plan of following through x 1 week.  Started on Lexapro 2 months ago and developed daily SI last week.

## 2013-07-08 NOTE — BH Assessment (Addendum)
Tele Assessment Note   Tony Walters is an 38 y.o. male that reports depression associated with the death of his girlfriend and his 40 year old son.  Patient reports that his wife and son were murdered in February 2015.  Patient reports SI without a plan.  Patient reports that he has supports at home and at work.  Patient reports that he has been employed at [employer] for the past 7 years.  Patient denies prior psychiatric hospitalizations.  Patient denies psychosis.  Patient denies SI,HI,Psychosis.    Patient reports that he has received counseling with the [facility] at his work place.  Patient reports compliance with taking his medication for depression.  Patient reports that he is able to contract for safety and he wants to receive outpatient therapy.  Patient denies substance abuse detox and treatment.   Patient denies physical, sexual or emotional abuse.   Axis I: Major Depression, single episode Axis II: Deferred Axis III:  Past Medical History  Diagnosis Date  . Chicken pox as a child  . Elevated BP 04/04/2013  . Grief reaction 04/04/2013  . Unspecified hypothyroidism 04/07/2013  . HTN (hypertension) 04/04/2013   Axis IV: other psychosocial or environmental problems, problems related to social environment and problems with primary support group Axis V: 41-50 serious symptoms  Past Medical History:  Past Medical History  Diagnosis Date  . Chicken pox as a child  . Elevated BP 04/04/2013  . Grief reaction 04/04/2013  . Unspecified hypothyroidism 04/07/2013  . HTN (hypertension) 04/04/2013    Past Surgical History  Procedure Laterality Date  . Hydrocele excision / repair  38 years old  . Wisdom tooth extraction  2010    Family History:  Family History  Problem Relation Age of Onset  . Congestive Heart Failure Father   . Hyperlipidemia Father   . Hypertension Father   . Kidney disease Maternal Grandmother   . Diabetes Maternal Grandmother     type 2  . Stroke  Maternal Grandmother   . Alcohol abuse Paternal Grandmother   . Pneumonia Paternal Grandfather   . Asthma Brother     Social History:  reports that he has been smoking Cigarettes.  He has a 16 pack-year smoking history. He has never used smokeless tobacco. He reports that he drinks alcohol. He reports that he does not use illicit drugs.  Additional Social History:     CIWA: CIWA-Ar BP: 149/99 mmHg Pulse Rate: 77 COWS:    Allergies: No Known Allergies  Home Medications:  (Not in a hospital admission)  OB/GYN Status:  No LMP for male patient.  General Assessment Data Location of Assessment:  ([place] ) Is this a Tele or Face-to-Face Assessment?: Tele Assessment Is this an Initial Assessment or a Re-assessment for this encounter?: Initial Assessment Living Arrangements: Alone Can pt return to current living arrangement?: Yes Admission Status: Voluntary Is patient capable of signing voluntary admission?: Yes Transfer from: Home Referral Source: Self/Family/Friend  Medical Screening Exam ([place]) Medical Exam completed: Yes  [place] Living Arrangements: Alone Name of Psychiatrist: Dr. Charlett Blake  Name of Therapist: NA  Education Status Is patient currently in school?: No Current Grade: NA Highest grade of school patient has completed: NA Name of school: NA Contact person: NA  Risk to self Suicidal Ideation: Yes-Currently Present (Without a plan ) Suicidal Intent: No Is patient at risk for suicide?: No Suicidal Plan?: No Access to Means: No What has been  your use of drugs/alcohol within the last 12 months?: Marijuanna Previous Attempts/Gestures: No How many times?: 0 Other Self Harm Risks: None Reported Triggers for Past Attempts:  (NA) Intentional Self Injurious Behavior: None Family Suicide History: No Recent stressful life event(s): Loss (Comment) (Wife and son were murdered) Persecutory voices/beliefs?: No Depression:  Yes Depression Symptoms: Insomnia;Isolating;Fatigue;Feeling worthless/self pity;Feeling angry/irritable Substance abuse history and/or treatment for substance abuse?: Yes Suicide prevention information given to non-admitted patients: Not applicable  Risk to Others Homicidal Ideation: No Thoughts of Harm to Others: No Current Homicidal Intent: No Current Homicidal Plan: No Access to Homicidal Means: No Identified Victim: BA History of harm to others?: No Assessment of Violence: None Noted Violent Behavior Description: NA Does patient have access to weapons?: No Criminal Charges Pending?: No Does patient have a court date: No  Psychosis Hallucinations: None noted Delusions: None noted  Mental Status Report Appear/Hygiene: Disheveled Eye Contact: Fair Motor Activity: Freedom of movement Speech: Logical/coherent Level of Consciousness: Alert Mood: Depressed;Anxious Affect: Appropriate to circumstance Anxiety Level: None Thought Processes: Coherent;Relevant Judgement: Unimpaired Orientation: Person;Place;Time;Situation Obsessive Compulsive Thoughts/Behaviors: None  Cognitive Functioning Concentration: Normal Memory: Recent Intact;Remote Intact IQ: Average Insight: Fair Impulse Control: Good Appetite: Good Weight Loss: 0 Weight Gain: 0 Sleep: Decreased Total Hours of Sleep: 4 Vegetative Symptoms: Staying in bed  ADLScreening [facility] Assessment Services) Patient's cognitive ability adequate to safely complete daily activities?: Yes Patient able to express need for assistance with ADLs?: Yes Independently performs ADLs?: Yes (appropriate for developmental age)  Prior Inpatient Therapy Prior Inpatient Therapy: No Prior Therapy Dates: NA Prior Therapy Facilty/Provider(s): NA Reason for Treatment: NA  Prior Outpatient Therapy Prior Outpatient Therapy: Yes Prior Therapy Dates: Ongoing  Prior Therapy Facilty/Provider(s): Dr. Charlett Blake  Reason for Treatment: MED Mgt  ADL  Screening (condition at time of admission) Patient's cognitive ability adequate to safely complete daily activities?: Yes Patient able to express need for assistance with ADLs?: Yes Independently performs ADLs?: Yes (appropriate for developmental age)         Values / Beliefs Cultural Requests During Hospitalization: None Spiritual Requests During Hospitalization: None        Additional Information 1:1 In Past 12 Months?: No CIRT Risk: No Elopement Risk: No Does patient have medical clearance?: Yes     Disposition: Pending psych disposition.  Disposition Initial Assessment Completed for this Encounter: Yes Disposition of Patient: Other dispositions Other disposition(s): Other (Comment) (Pending psyvh disposition.)  Rene Paci 07/08/2013 3:58 PM

## 2013-07-08 NOTE — ED Notes (Addendum)
Pt reports he has a hx of depression due to familial traumatic losses.  Has been taking antianxiety medications but depression seems worse.  He was started on Lexapro 2 months ago and reports daily suicidal thoughts.  He tells me he doesn't want to hurt himself and does not have a plan. "I just want to get better".

## 2013-07-08 NOTE — ED Notes (Signed)
TTS called in and talked to pt

## 2013-07-08 NOTE — ED Notes (Signed)
Patient sleeping, awaiting TTS call.

## 2013-07-08 NOTE — ED Notes (Signed)
[facility] and [facility] called for sitter and none available.

## 2013-07-08 NOTE — BH Assessment (Signed)
[place] Assessment Progress Note  This Probation officer spoke to Catalina Pizza, NP after Heloise Purpura performed tele-psychiatry consult on pt.  He recommends that pt be discharged with referrals to outpatient psychiatry and therapy providers in the community.  At 18:04 I spoke to EDP Thayer Jew, MD, who concurs with this opinion.  I then called pt's nurse, Collie Siad, with the following referrals:       [facility] at [facility] [place], [place] [address]      [place]      [place]      [place], [place] [address]      [phone]       [facility]      [address]      [place], [place] [address]      [phone]   Jalene Mullet, [place] Triage Specialist 07/08/2013 @ 18:12

## 2013-07-08 NOTE — ED Notes (Signed)
Ava from [facility] called to inform pt may meet contract for safety-Conrad will be calling back at 3pm for assessment

## 2013-07-08 NOTE — Discharge Instructions (Signed)

## 2013-07-08 NOTE — ED Provider Notes (Signed)
CSN: [id number]     Arrival date & time 07/08/13  1740 History   First MD Initiated Contact with Patient 07/08/13 [phone]     Chief Complaint  Patient presents with  . Suicidal     (Consider location/radiation/quality/duration/timing/severity/associated sxs/prior Treatment) Patient is a 38 y.o. male presenting with mental health disorder. The history is provided by the patient.  Mental Health Problem Presenting symptoms: depression and suicidal thoughts   Presenting symptoms: no suicidal threats and no suicide attempt   Degree of incapacity (severity):  Moderate Onset quality:  Gradual Duration:  1 week Timing:  Constant Progression:  Worsening Chronicity:  New Context: stressful life event   Context: not alcohol use, not drug abuse and not medication   Treatment compliance:  All of the time Time since last psychoactive medication taken:  1 day Relieved by:  Nothing Worsened by:  Nothing tried Ineffective treatments:  Antidepressants Associated symptoms: anhedonia, decreased need for sleep and fatigue   Associated symptoms: no abdominal pain, no chest pain and no hypersomnia   Risk factors: no family hx of mental illness, no family violence and no hx of suicide attempts     Past Medical History  Diagnosis Date  . Chicken pox as a child  . Elevated BP 04/04/2013  . Grief reaction 04/04/2013  . Unspecified hypothyroidism 04/07/2013  . HTN (hypertension) 04/04/2013   Past Surgical History  Procedure Laterality Date  . Hydrocele excision / repair  38 years old  . Wisdom tooth extraction  2010   Family History  Problem Relation Age of Onset  . Congestive Heart Failure Father   . Hyperlipidemia Father   . Hypertension Father   . Kidney disease Maternal Grandmother   . Diabetes Maternal Grandmother     type 2  . Stroke Maternal Grandmother   . Alcohol abuse Paternal Grandmother   . Pneumonia Paternal Grandfather   . Asthma Brother    History  Substance Use Topics  .  Smoking status: Current Every Day Smoker -- 20.00 packs/day for .8 years    Types: Cigarettes  . Smokeless tobacco: Never Used     Comment: quit in Nov 2014 but started back 03-18-13  . Alcohol Use: Yes     Comment: occasionally    Review of Systems  Constitutional: Positive for fatigue. Negative for fever.  Respiratory: Negative for cough and shortness of breath.   Cardiovascular: Negative for chest pain and leg swelling.  Gastrointestinal: Negative for vomiting and abdominal pain.  Psychiatric/Behavioral: Positive for suicidal ideas.  All other systems reviewed and are negative.     Allergies  Review of patient's allergies indicates no known allergies.  Home Medications   Prior to Admission medications   Medication Sig Start Date End Date Taking? Authorizing Provider  escitalopram (LEXAPRO) 10 MG tablet Take 1 tablet (10 mg total) by mouth daily. 07/02/13  Yes Mosie Lukes, MD  carvedilol (COREG) 6.25 MG tablet Take 1 tablet (6.25 mg total) by mouth 2 (two) times daily with a meal. 07/02/13   Mosie Lukes, MD  clonazePAM (KLONOPIN) 1 MG tablet 1/2 to 2 tabs po bid prn anxiety and insomnia 07/02/13   Mosie Lukes, MD  hydrochlorothiazide (HYDRODIURIL) 25 MG tablet Take 1 tablet (25 mg total) by mouth daily. 07/02/13   Mosie Lukes, MD  levothyroxine (SYNTHROID, LEVOTHROID) 50 MCG tablet Take 1 tablet (50 mcg total) by mouth daily. 04/05/13   Mosie Lukes, MD   BP 149/99  Pulse 77  Temp(Src) 98.9 F (37.2 C)  Resp 16  Ht 6\' 2"  (1.88 m)  Wt 266 lb (120.657 kg)  BMI 34.14 kg/m2  SpO2 99% Physical Exam  Nursing note and vitals reviewed. Constitutional: He is oriented to person, place, and time. He appears well-developed and well-nourished. No distress.  HENT:  Head: Normocephalic and atraumatic.  Mouth/Throat: No oropharyngeal exudate.  Eyes: EOM are normal. Pupils are equal, round, and reactive to light.  Neck: Normal range of motion. Neck supple.  Cardiovascular:  Normal rate and regular rhythm.  Exam reveals no friction rub.   No murmur heard. Pulmonary/Chest: Effort normal and breath sounds normal. No respiratory distress. He has no wheezes. He has no rales.  Abdominal: He exhibits no distension. There is no tenderness. There is no rebound.  Musculoskeletal: Normal range of motion. He exhibits no edema.  Neurological: He is alert and oriented to person, place, and time.  Skin: He is not diaphoretic.  Psychiatric: His affect is not blunt and not labile. His speech is not rapid and/or pressured and not slurred. He is not agitated, not aggressive, not hyperactive, not slowed, not withdrawn, not actively hallucinating and not combative. Thought content is not paranoid. He does not express impulsivity or inappropriate judgment. He exhibits a depressed mood. He expresses suicidal ideation. He expresses no homicidal ideation. He expresses no suicidal plans and no homicidal plans. He is communicative. He is attentive.    ED Course  Procedures (including critical care time) Labs Review Labs Reviewed  CBC WITH DIFFERENTIAL  BASIC METABOLIC PANEL  ACETAMINOPHEN LEVEL  SALICYLATE LEVEL  URINALYSIS, ROUTINE W REFLEX MICROSCOPIC  URINE RAPID DRUG SCREEN (HOSP PERFORMED)  ETHANOL    Imaging Review No results found.   EKG Interpretation None      MDM   Final diagnoses:  Depression with anxiety    28M here with SI. No plan. Vague feeilngs, worsening over past week. Stressed over pending charges with police. Hx of traumatic events causing breakdowns before - lost an older brother 60 years ago, within past year watched his girlfriend's abortion and then she died a few months later.  Is on lexapro for past 2 months, this is the first anti-depressant he's ever been on. No prior suicidal thoughts before.  States insomnia, loss of joy, difficulty working due to his depression. Will consult TTS.  Dr. Dina Rich will follow up on Psychiatry  disposition.  Osvaldo Shipper, MD 07/09/13 [phone]

## 2013-07-08 NOTE — Telephone Encounter (Signed)
Opened in error

## 2013-07-08 NOTE — ED Notes (Signed)
Waiting for TTS to return call for a consult.  Called x 3.  Sitter at bedside.

## 2013-07-08 NOTE — ED Notes (Signed)
Patient sleeping

## 2013-07-08 NOTE — ED Provider Notes (Signed)
I was not directly involved in this patient's care. Reports the patient had passive suicidality.At Sign Out, patient to be evaluated by psychiatry NP.  Per Dr. Mingo Amber, SI passive and disposition per psychiatry.  Per psychiatry, patient is able to contract for safety and they will provide outpatient resources for patient.  Patient will be discharged and provided outpatient resources.  Merryl Hacker, MD 07/08/13 [phone]

## 2013-07-08 NOTE — Consult Note (Signed)
Pt seen and chart reviewed. Pt originally presented to [facility] ED for suicidal ideation triggered by rumination about the death of his significant other and one of his children who were reportedly murdered in February 2015. Pt denies SI, HI, and AVH, contracts for safety. Pt states that he has been seeking counseling at his work place but that he would like to followup with a psychiatrist and therapist with the assistance of [facility] staff. Pt reports 3 children and other family members as a support system and a reason not to harm himself. This NP has reviewed and agrees with the assessment of [facility] TTS Staff Tele-assessment about pt's readiness to discharge home, case reviewed with Dr. Dwyane Dee as well. Pt may be discharged home with his family with these followup plans and safety plan in place. Pt affirms understanding.   Psychiatric Specialty Exam: Physical Exam  ROS  Blood pressure 128/72, pulse 70, temperature 98.4 F (36.9 C), resp. rate 16, height 6\' 2"  (1.88 m), weight 120.657 kg (266 lb), SpO2 100.00%.Body mass index is 34.14 kg/(m^2).  General Appearance: Casual  Eye Contact::  Good  Speech:  Clear and Coherent  Volume:  Normal  Mood:  Euthymic  Affect:  Appropriate  Thought Process:  Goal Directed  Orientation:  Full (Time, Place, and Person)  Thought Content:  WDL  Suicidal Thoughts:  No  Homicidal Thoughts:  No  Memory:  Immediate;   Fair Recent;   Fair Remote;   Fair  Judgement:  Fair  Insight:  Good  Psychomotor Activity:  Decreased  Concentration:  Fair  Recall:  Fair  Akathisia:  No  Handed:    AIMS (if indicated):     Assets:  Communication Skills Desire for Improvement Resilience Social Support  Sleep:      Plan: Discharge home with outpatient referrals to psychiatrist and therapist. -[facility] TTS to assist with disposition planning.    Benjamine Mola, FNP-BC   07/09/13     05:08PM

## 2013-07-08 NOTE — ED Notes (Signed)
Correction. Pt unable to void.

## 2013-07-08 NOTE — ED Notes (Signed)
Patient unable to provide urine sample at this time.  Patient states there isn't a need to test his urine because it will show he took clonopin, xanax, and pot.

## 2013-07-08 NOTE — ED Notes (Signed)
TTS called in for assessment

## 2013-07-08 NOTE — ED Notes (Signed)
Sitter at bedside.  Patient walked to restroom for needed urine sample.

## 2013-07-08 NOTE — Progress Notes (Addendum)
Per Tony Walters, patient needs  to receive a Tele Psych.  Tony Purpura, NP will assess the patient at 3pm.  Writer informed the nurse of the upcoming Tele Psych.

## 2013-07-08 NOTE — ED Notes (Signed)
Ava from TTS called in-I spoke with her and she is calling back in on TTS to speak to pt again

## 2013-07-09 ENCOUNTER — Telehealth: Payer: Self-pay

## 2013-07-09 NOTE — Consult Note (Signed)
Case discussed, and agree with plan. Patient needs outpatient services. He has good family support. Safety contract signed by patient. Also discussed the need for patient to return if feeling overwhelmed

## 2013-07-09 NOTE — Telephone Encounter (Signed)
He needs to be taking the lexapro or I can switch to another but he needs to let me know what problems he is having or he needs to go to see psychiatry.

## 2013-07-09 NOTE — Telephone Encounter (Signed)
Needs appt for follow up to discuss

## 2013-07-09 NOTE — Telephone Encounter (Signed)
Pt left a message stating he came by to see Dr Charlett Blake and myself yesterday because he is having some issues with medication (looks like he went to ED yesterday)

## 2013-07-09 NOTE — Telephone Encounter (Signed)
FYI:  Pt called in stating that he hasn't taken the Lexapro in a few days and went downstairs to ER and they put him on suicide watch.  Pt states none of his meds changed but he stated that the ER "freaked" him out and now he thinks he might need to extend his fmla or short term disability.

## 2013-07-10 ENCOUNTER — Encounter: Payer: Self-pay | Admitting: Family Medicine

## 2013-07-10 NOTE — Assessment & Plan Note (Signed)
Has been unable to return to work secondary to his severe reaction. Continue current meds for now

## 2013-07-10 NOTE — Progress Notes (Signed)
Patient ID: Tony Walters, male   DOB: 07/21/1975, 38 y.o.   MRN: [id number] Tony Walters [id number] August 21, 1975 07/10/2013      Progress Note-Follow Up  Subjective  Chief Complaint  Chief Complaint  Patient presents with  . Follow-up    HPI  Patient is a 38 year old male in today for routine medical care. He isn't for reevaluation of numerous concerns. He continues to struggle with depression and anhedonia secondary to his grief reaction after his girlfriend and her son's murder. He denies suicidal ideation but does struggle with anhedonia and difficult concentration. He has not taken all of his meds today but generally has been taking it routinely. No recent illness. Denies CP/palp/SOB/HA/congestion/fevers/GI or GU c/o. Taking meds as prescribed  Past Medical History  Diagnosis Date  . Chicken pox as a child  . Elevated BP 04/04/2013  . Grief reaction 04/04/2013  . Unspecified hypothyroidism 04/07/2013  . HTN (hypertension) 04/04/2013    Past Surgical History  Procedure Laterality Date  . Hydrocele excision / repair  38 years old  . Wisdom tooth extraction  2010    Family History  Problem Relation Age of Onset  . Congestive Heart Failure Father   . Hyperlipidemia Father   . Hypertension Father   . Kidney disease Maternal Grandmother   . Diabetes Maternal Grandmother     type 2  . Stroke Maternal Grandmother   . Alcohol abuse Paternal Grandmother   . Pneumonia Paternal Grandfather   . Asthma Brother     History   Social History  . Marital Status: Single    Spouse Name: N/A    Number of Children: N/A  . Years of Education: N/A   Occupational History  . Not on file.   Social History Main Topics  . Smoking status: Current Every Day Smoker -- 20.00 packs/day for .8 years    Types: Cigarettes  . Smokeless tobacco: Never Used     Comment: quit in Nov 2014 but started back 03-18-13  . Alcohol Use: Yes     Comment: occasionally  . Drug Use: No  . Sexual Activity:  Not Currently    Partners: Female   Other Topics Concern  . Not on file   Social History Narrative  . No narrative on file    Current Outpatient Prescriptions on File Prior to Visit  Medication Sig Dispense Refill  . levothyroxine (SYNTHROID, LEVOTHROID) 50 MCG tablet Take 1 tablet (50 mcg total) by mouth daily.  30 tablet  2   No current facility-administered medications on file prior to visit.    No Known Allergies  Review of Systems  Review of Systems  Constitutional: Positive for malaise/fatigue. Negative for fever.  HENT: Negative for congestion.   Eyes: Negative for discharge.  Respiratory: Negative for shortness of breath.   Cardiovascular: Negative for chest pain, palpitations and leg swelling.  Gastrointestinal: Negative for nausea, abdominal pain and diarrhea.  Genitourinary: Negative for dysuria.  Musculoskeletal: Negative for falls.  Skin: Negative for rash.  Neurological: Negative for loss of consciousness and headaches.  Endo/Heme/Allergies: Negative for polydipsia.  Psychiatric/Behavioral: Positive for depression. Negative for suicidal ideas. The patient is nervous/anxious and has insomnia.     Objective  BP 150/92  Pulse 84  Temp(Src) 98.4 F (36.9 C) (Oral)  Ht 6\' 2"  (1.88 m)  Wt 266 lb (120.657 kg)  BMI 34.14 kg/m2  SpO2 96%  Physical Exam  Physical Exam  Constitutional: He is oriented to person, place,  and time and well-developed, well-nourished, and in no distress. No distress.  HENT:  Head: Normocephalic and atraumatic.  Eyes: Conjunctivae are normal.  Neck: Neck supple. No thyromegaly present.  Cardiovascular: Normal rate, regular rhythm and normal heart sounds.   No murmur heard. Pulmonary/Chest: Effort normal and breath sounds normal. No respiratory distress.  Abdominal: He exhibits no distension and no mass. There is no tenderness.  Musculoskeletal: He exhibits no edema.  Neurological: He is alert and oriented to person, place, and  time.  Skin: Skin is warm.  Psychiatric: Memory, affect and judgment normal.    Lab Results  Component Value Date   TSH 5.949* 07/02/2013   Lab Results  Component Value Date   WBC 9.3 07/08/2013   HGB 16.4 07/08/2013   HCT 47.4 07/08/2013   MCV 83.5 07/08/2013   PLT 242 07/08/2013   Lab Results  Component Value Date   CREATININE 1.20 07/08/2013   BUN 13 07/08/2013   NA 140 07/08/2013   K 3.9 07/08/2013   CL 97 07/08/2013   CO2 31 07/08/2013   Lab Results  Component Value Date   ALT 32 04/04/2013   AST 30 04/04/2013   ALKPHOS 57 04/04/2013   BILITOT 0.4 04/04/2013   Lab Results  Component Value Date   CHOL 163 04/04/2013   Lab Results  Component Value Date   HDL 51 04/04/2013   Lab Results  Component Value Date   LDLCALC 67 04/04/2013   Lab Results  Component Value Date   TRIG 223* 04/04/2013   Lab Results  Component Value Date   CHOLHDL 3.2 04/04/2013     Assessment & Plan  HTN (hypertension) Poorly controlled will alter medications, encouraged DASH diet, minimize caffeine and obtain adequate sleep. Report concerning symptoms and follow up as directed and as needed  Grief reaction Has been unable to return to work secondary to his severe reaction. Continue current meds for now  Unspecified hypothyroidism On Levothyroxine, continue to monitor

## 2013-07-10 NOTE — Assessment & Plan Note (Signed)
Poorly controlled will alter medications, encouraged DASH diet, minimize caffeine and obtain adequate sleep. Report concerning symptoms and follow up as directed and as needed 

## 2013-07-10 NOTE — Telephone Encounter (Signed)
FYI  I informed patient and he states that the lexapro was giving him suicidal thoughts. Pt states that the ER gave him some names and numbers of counselors but he left [place] after that.  Pt states he is going to have to have his time extended to be off and will be faxing some fmla papers

## 2013-07-10 NOTE — Assessment & Plan Note (Signed)
On Levothyroxine, continue to monitor 

## 2013-07-12 ENCOUNTER — Telehealth: Payer: Self-pay | Admitting: Family Medicine

## 2013-07-12 NOTE — Telephone Encounter (Signed)
Patient left message stating he faxed over FMLA for an extended leave of absence. Patient states he is not mentally able to return to work yet.  Called patient and let him know Christy and Dr. Charlett Blake are not in the office, but I will look at his paperwork next week and see what Dr. Charlett Blake suggests and give him a call back

## 2013-07-14 NOTE — Telephone Encounter (Signed)
He needs to tell me why he feels not ready, what date he is hoping for and he needs to see a psychiatrist, they will simply not let him stay out much longer without much input. Please give him some names of some psychiatrists to set up with. I cannot do much more without seeing him again either

## 2013-07-15 NOTE — Telephone Encounter (Signed)
Spoke with patient informed of Dr. Rhae Lerner recommendation. Patient states he is currently working on setting up his psych referral, he did not want me to give him any names. Patient is requesting to be out of work for 2 more weeks due to his episode on Monday, he was seen in ED at [facility] (see Note). He has faxed the FMLA to the office, if and when we fax this back we need to include the OV notes.  Advised patient to schedule appointment but Dr. B is out of the office this week and patient states his FMLA has already expired so he needs something done soon

## 2013-07-16 NOTE — Telephone Encounter (Signed)
Will get forms today while in building. Please make sure they print a copy of the forms I have already done for him and last  notes

## 2013-07-17 NOTE — Telephone Encounter (Signed)
Faxed forms to leave management group @ [phone]. Informed patient that originals were ready for pickup and made copy for ourselves.

## 2013-07-19 ENCOUNTER — Telehealth: Payer: Self-pay

## 2013-07-19 NOTE — Telephone Encounter (Signed)
Patient states that he is planning on returning to work on 7/15   Also, he state that he feels like klonopin and lexepro are not working as well as they should

## 2013-07-19 NOTE — Telephone Encounter (Signed)
Per md need to know when pt is planning on returning to work.  Left a detailed message for patient to return my call with this information

## 2013-07-22 NOTE — Telephone Encounter (Signed)
Paperwork sent to front desk to be faxed

## 2013-07-23 ENCOUNTER — Telehealth: Payer: Self-pay

## 2013-07-23 NOTE — Telephone Encounter (Signed)
agree

## 2013-07-23 NOTE — Telephone Encounter (Signed)
Left a message for patient to call back and schedule an appt before 08-07-13 and we need to know the date for his paperwork

## 2013-07-29 NOTE — Telephone Encounter (Signed)
Verbal per md will come in on 08-01-13 at 7:30.  Patient informed and agreed to date and time.  Stephanie added pt on schedule

## 2013-07-29 NOTE — Telephone Encounter (Signed)
Are you willing to come in early on Friday 08-02-13? Patient would like to be released for 08-07-13

## 2013-08-01 ENCOUNTER — Ambulatory Visit: Payer: Self-pay | Admitting: Family Medicine

## 2013-08-01 ENCOUNTER — Telehealth: Payer: Self-pay | Admitting: Family Medicine

## 2013-08-01 DIAGNOSIS — Z0289 Encounter for other administrative examinations: Secondary | ICD-10-CM

## 2013-08-01 NOTE — Telephone Encounter (Signed)
Patient states that he does need a return to work note and he wanted to go over medications before he went back to work. Will either one of you see this patient sometime next week since Dr. Charlett Blake will be out of the office? Thanks!

## 2013-08-01 NOTE — Telephone Encounter (Signed)
He needs a note to return to work for 08-07-13. He will need to see another provider (check with provider first to see if they are willing to see him for this)

## 2013-08-01 NOTE — Telephone Encounter (Signed)
Patient was due at 730 per Dr B  And did not show

## 2013-08-01 NOTE — Telephone Encounter (Signed)
Sure

## 2013-08-01 NOTE — Telephone Encounter (Signed)
Needs to be charged, needs appt for work paperwork

## 2013-08-01 NOTE — Telephone Encounter (Signed)
Patient called in stating that he forgot about his appointment. I did call yesterday to remind him of appointment. When can I get patient back in to be seen? Thanks!

## 2013-08-01 NOTE — Telephone Encounter (Signed)
Appointment scheduled for 08/05/13

## 2013-08-05 ENCOUNTER — Ambulatory Visit: Payer: BC Managed Care – PPO | Admitting: Family

## 2013-08-05 DIAGNOSIS — Z0289 Encounter for other administrative examinations: Secondary | ICD-10-CM

## 2013-08-16 ENCOUNTER — Ambulatory Visit (INDEPENDENT_AMBULATORY_CARE_PROVIDER_SITE_OTHER): Payer: BC Managed Care – PPO | Admitting: Family

## 2013-08-16 ENCOUNTER — Encounter: Payer: Self-pay | Admitting: Family

## 2013-08-16 VITALS — BP 144/98 | HR 83 | Temp 98.2°F | Resp 16 | Ht 74.0 in | Wt 271.0 lb

## 2013-08-16 DIAGNOSIS — F418 Other specified anxiety disorders: Secondary | ICD-10-CM

## 2013-08-16 DIAGNOSIS — F341 Dysthymic disorder: Secondary | ICD-10-CM

## 2013-08-16 MED ORDER — ALPRAZOLAM 0.5 MG PO TABS: 0.5000 mg | ORAL_TABLET | Freq: Two times a day (BID) | ORAL | Status: DC | PRN

## 2013-08-16 MED ORDER — VENLAFAXINE HCL ER 37.5 MG PO CP24: ORAL_CAPSULE | ORAL | Status: DC
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
## 2013-08-16 NOTE — Assessment & Plan Note (Addendum)
Uncontrolled. Continue lexapro, and add Effexor.   I instructed pt to start 1 tablet once daily for 3 days,  then increase to a full tablet once daily on week two as tolerated. Also discussed rare but serious side effect of suicide ideation.  He is instructed to discontinue medication and go directly to ED if this occurs.  Pt verbalizes understanding. He is instructed to continue his work with his therapist.  OK to return to work at this time.  Plan follow up in 1 month to evaluate progress.   D/c klonopin, trial xanax instead. 30 min spent with pt.  >50 % of this time was spent counseling pt on anxiety and depression.

## 2013-08-16 NOTE — Progress Notes (Signed)
Pre visit review using our clinic review tool, if applicable. No additional management support is needed unless otherwise documented below in the visit note. 

## 2013-08-16 NOTE — Patient Instructions (Signed)
Start effexor. Continue lexapro and work with your therapist.  Go to the ER if you develop thoughts of hurting yourself or others.  Follow up with Dr. Charlett Blake in 1 month.

## 2013-09-26 ENCOUNTER — Ambulatory Visit: Payer: Self-pay | Admitting: Family Medicine

## 2013-10-07 ENCOUNTER — Telehealth: Payer: Self-pay | Admitting: Family Medicine

## 2013-10-07 ENCOUNTER — Ambulatory Visit: Payer: BC Managed Care – PPO | Admitting: Family Medicine

## 2013-10-07 DIAGNOSIS — Z0289 Encounter for other administrative examinations: Secondary | ICD-10-CM

## 2013-10-07 NOTE — Telephone Encounter (Signed)
3 MONTH FOLLOW UP NO SHOW

## 2013-10-07 NOTE — Telephone Encounter (Signed)
Have signed letter to terminate patient

## 2013-10-07 NOTE — Telephone Encounter (Signed)
Pt is being terminated

## 2014-03-17 ENCOUNTER — Emergency Department (HOSPITAL_BASED_OUTPATIENT_CLINIC_OR_DEPARTMENT_OTHER)
Admission: EM | Admit: 2014-03-17 | Discharge: 2014-03-17 | Disposition: A | Discharge status: Home / Self Care | Payer: Self-pay | Attending: Emergency Medicine | Admitting: Emergency Medicine

## 2014-03-17 ENCOUNTER — Encounter (HOSPITAL_BASED_OUTPATIENT_CLINIC_OR_DEPARTMENT_OTHER): Payer: Self-pay | Admitting: *Deleted

## 2014-03-17 DIAGNOSIS — E039 Hypothyroidism, unspecified: Secondary | ICD-10-CM | POA: Insufficient documentation

## 2014-03-17 DIAGNOSIS — Z79899 Other long term (current) drug therapy: Secondary | ICD-10-CM | POA: Insufficient documentation

## 2014-03-17 DIAGNOSIS — Z8659 Personal history of other mental and behavioral disorders: Secondary | ICD-10-CM | POA: Insufficient documentation

## 2014-03-17 DIAGNOSIS — I10 Essential (primary) hypertension: Secondary | ICD-10-CM | POA: Insufficient documentation

## 2014-03-17 DIAGNOSIS — Z720 Tobacco use: Secondary | ICD-10-CM | POA: Insufficient documentation

## 2014-03-17 DIAGNOSIS — N390 Urinary tract infection, site not specified: Secondary | ICD-10-CM | POA: Insufficient documentation

## 2014-03-17 DIAGNOSIS — Z8619 Personal history of other infectious and parasitic diseases: Secondary | ICD-10-CM | POA: Insufficient documentation

## 2014-03-17 LAB — URINALYSIS, ROUTINE W REFLEX MICROSCOPIC
BILIRUBIN URINE: NEGATIVE
GLUCOSE, UA: NEGATIVE mg/dL
Ketones, ur: NEGATIVE mg/dL
Nitrite: NEGATIVE
PH: 5 (ref 5.0–8.0)
PROTEIN: NEGATIVE mg/dL
Specific Gravity, Urine: 1.026 (ref 1.005–1.030)
UROBILINOGEN UA: 0.2 mg/dL (ref 0.0–1.0)

## 2014-03-17 LAB — URINE MICROSCOPIC-ADD ON

## 2014-03-17 MED ORDER — CEFTRIAXONE SODIUM 250 MG IJ SOLR
250.0000 mg | Freq: Once | INTRAMUSCULAR | Status: AC
  Administered 2014-03-17: 250 mg via INTRAMUSCULAR
  Filled 2014-03-17: qty 250

## 2014-03-17 MED ORDER — AZITHROMYCIN 1 G PO PACK
1.0000 g | PACK | Freq: Once | ORAL | Status: AC
  Administered 2014-03-17: 1 g via ORAL
  Filled 2014-03-17: qty 1

## 2014-03-17 MED ORDER — LIDOCAINE HCL (PF) 1 % IJ SOLN
INTRAMUSCULAR | Status: AC
  Administered 2014-03-17: 5 mL
  Filled 2014-03-17: qty 5

## 2014-03-17 MED ORDER — CEPHALEXIN 500 MG PO CAPS: 500.0000 mg | ORAL_CAPSULE | Freq: Two times a day (BID) | ORAL | Status: DC

## 2014-03-17 NOTE — ED Notes (Signed)
Pt requests check for uti.

## 2014-03-17 NOTE — ED Notes (Signed)
Pt refuses blood draw, states "I can't afford all that, I'll go to the health department and have that done..." Dr. Ralene Bathe alerted.

## 2014-03-17 NOTE — ED Provider Notes (Signed)
CSN: [id number]     Arrival date & time 03/17/14  0654 History   First MD Initiated Contact with Patient 03/17/14 [phone]     Chief Complaint  Patient presents with  . Penile Discharge    The history is provided by the patient. No language interpreter was used.   Mr. Buell presents for evaluation of penile discharge. He noticed a yellow penile discharge one week ago which then resolved. Yesterday he noticed recurrent yellow discharge. He describes it as comfortable and more prevalent in the morning. He denies any dysuria or abdominal pain. He does describe feeling weird in his penis in his abdomen. He denies any fevers, nausea, vomiting, diarrhea, abdominal pain, skin rash. He denies any new sexual partners. He has a history of high blood pressure and hypothyroidism but is not currently taking his medications. Symptoms are mild, intermittent, worsening.  .  Past Medical History  Diagnosis Date  . Chicken pox as a child  . Elevated BP 04/04/2013  . Grief reaction 04/04/2013  . Unspecified hypothyroidism 04/07/2013  . HTN (hypertension) 04/04/2013   Past Surgical History  Procedure Laterality Date  . Hydrocele excision / repair  39 years old  . Wisdom tooth extraction  2010   Family History  Problem Relation Age of Onset  . Congestive Heart Failure Father   . Hyperlipidemia Father   . Hypertension Father   . Kidney disease Maternal Grandmother   . Diabetes Maternal Grandmother     type 2  . Stroke Maternal Grandmother   . Alcohol abuse Paternal Grandmother   . Pneumonia Paternal Grandfather   . Asthma Brother    History  Substance Use Topics  . Smoking status: Current Every Day Smoker -- 20.00 packs/day for .8 years    Types: Cigarettes  . Smokeless tobacco: Never Used     Comment: quit in Nov 2014 but started back 03-18-13  . Alcohol Use: Yes     Comment: occasionally    Review of Systems  All other systems reviewed and are negative.     Allergies  Review of patient's  allergies indicates no known allergies.  Home Medications   Prior to Admission medications   Medication Sig Start Date End Date Taking? Authorizing Provider  ALPRAZolam Duanne Moron) 0.5 MG tablet Take 1 tablet (0.5 mg total) by mouth 2 (two) times daily as needed for anxiety. 08/16/13   Debbrah Alar, NP  carvedilol (COREG) 6.25 MG tablet Take 1 tablet (6.25 mg total) by mouth 2 (two) times daily with a meal. 07/02/13   Mosie Lukes, MD  escitalopram (LEXAPRO) 10 MG tablet Take 1 tablet (10 mg total) by mouth daily. 07/02/13   Mosie Lukes, MD  hydrochlorothiazide (HYDRODIURIL) 25 MG tablet Take 1 tablet (25 mg total) by mouth daily. 07/02/13   Mosie Lukes, MD  levothyroxine (SYNTHROID, LEVOTHROID) 50 MCG tablet Take 1 tablet (50 mcg total) by mouth daily. 04/05/13   Mosie Lukes, MD  venlafaxine XR (EFFEXOR XR) 37.5 MG 24 hr capsule One tab by mouth daily for 3 days, then increase to 2 tabs once daily 08/16/13   Debbrah Alar, NP   BP 193/102 mmHg  Pulse 79  Temp(Src) 98.6 F (37 C) (Oral)  Resp 18  Ht 6\' 2"  (1.88 m)  Wt 265 lb (120.203 kg)  BMI 34.01 kg/m2  SpO2 100% Physical Exam  Constitutional: He is oriented to person, place, and time. He appears well-developed and well-nourished.  HENT:  Head: Normocephalic and atraumatic.  Cardiovascular: Normal rate and regular rhythm.   No murmur heard. Pulmonary/Chest: Effort normal and breath sounds normal. No respiratory distress.  Abdominal: Soft. There is no tenderness. There is no rebound and no guarding.  Genitourinary: Penis normal. No penile tenderness.  Musculoskeletal: He exhibits no edema or tenderness.  Neurological: He is alert and oriented to person, place, and time.  Skin: Skin is warm and dry. No rash noted.  Psychiatric: He has a normal mood and affect. His behavior is normal.  Nursing note and vitals reviewed.   ED Course  Procedures (including critical care time) Labs Review Labs Reviewed  URINALYSIS,  ROUTINE W REFLEX MICROSCOPIC - Abnormal; Notable for the following:    APPearance CLOUDY (*)    Hgb urine dipstick TRACE (*)    Leukocytes, UA MODERATE (*)    All other components within normal limits  URINE MICROSCOPIC-ADD ON - Abnormal; Notable for the following:    Squamous Epithelial / LPF FEW (*)    Bacteria, UA MANY (*)    All other components within normal limits  GC/CHLAMYDIA PROBE AMP ([place])    Imaging Review No results found.   EKG Interpretation None      MDM   Final diagnoses:  UTI (lower urinary tract infection)    Patient here for evaluation of penile discharge. UA is consistent with UTI, treated with Keflex. Treat empirically for possible sexual transmitted infection given patient's symptoms. The patient elevated blood pressure and need to resume his blood pressure medications as well as PCP follow-up. Discussed return precautions for urinary tract infection. Discuss prophylaxis for STDs.    Quintella Reichert, MD 03/17/14 [phone]

## 2014-03-17 NOTE — ED Notes (Signed)
Pt amb to room 6 with quick steady gait smiling in nad. Pt reports yellow discharge from his penis x last week.

## 2014-03-17 NOTE — ED Notes (Signed)
MD at bedside. 

## 2014-03-17 NOTE — Discharge Instructions (Signed)
Your blood pressure was high today.  Please restart your blood pressure medications and follow up with your doctor for recheck in the next week.    Urinary Tract Infection Urinary tract infections (UTIs) can develop anywhere along your urinary tract. Your urinary tract is your body's drainage system for removing wastes and extra water. Your urinary tract includes two kidneys, two ureters, a bladder, and a urethra. Your kidneys are a pair of bean-shaped organs. Each kidney is about the size of your fist. They are located below your ribs, one on each side of your spine. CAUSES Infections are caused by microbes, which are microscopic organisms, including fungi, viruses, and bacteria. These organisms are so small that they can only be seen through a microscope. Bacteria are the microbes that most commonly cause UTIs. SYMPTOMS  Symptoms of UTIs may vary by age and gender of the patient and by the location of the infection. Symptoms in young women typically include a frequent and intense urge to urinate and a painful, burning feeling in the bladder or urethra during urination. Older women and men are more likely to be tired, shaky, and weak and have muscle aches and abdominal pain. A fever may mean the infection is in your kidneys. Other symptoms of a kidney infection include pain in your back or sides below the ribs, nausea, and vomiting. DIAGNOSIS To diagnose a UTI, your caregiver will ask you about your symptoms. Your caregiver also will ask to provide a urine sample. The urine sample will be tested for bacteria and white blood cells. White blood cells are made by your body to help fight infection. TREATMENT  Typically, UTIs can be treated with medication. Because most UTIs are caused by a bacterial infection, they usually can be treated with the use of antibiotics. The choice of antibiotic and length of treatment depend on your symptoms and the type of bacteria causing your infection. HOME CARE  INSTRUCTIONS  If you were prescribed antibiotics, take them exactly as your caregiver instructs you. Finish the medication even if you feel better after you have only taken some of the medication.  Drink enough water and fluids to keep your urine clear or pale yellow.  Avoid caffeine, tea, and carbonated beverages. They tend to irritate your bladder.  Empty your bladder often. Avoid holding urine for long periods of time.  Empty your bladder before and after sexual intercourse.  After a bowel movement, women should cleanse from front to back. Use each tissue only once. SEEK MEDICAL CARE IF:   You have back pain.  You develop a fever.  Your symptoms do not begin to resolve within 3 days. SEEK IMMEDIATE MEDICAL CARE IF:   You have severe back pain or lower abdominal pain.  You develop chills.  You have nausea or vomiting.  You have continued burning or discomfort with urination. MAKE SURE YOU:   Understand these instructions.  Will watch your condition.  Will get help right away if you are not doing well or get worse. Document Released: 10/20/2004 Document Revised: 07/12/2011 Document Reviewed: 02/18/2011 [facility] Patient Information 2015 [place], [place]. This information is not intended to replace advice given to you by your health care provider. Make sure you discuss any questions you have with your health care provider.  Sexually Transmitted Disease A sexually transmitted disease (STD) is a disease or infection that may be passed (transmitted) from person to person, usually during sexual activity. This may happen by way of saliva, semen, blood, vaginal mucus, or  urine. Common STDs include:   Gonorrhea.   Chlamydia.   Syphilis.   HIV and AIDS.   Genital herpes.   Hepatitis B and C.   Trichomonas.   Human papillomavirus (HPV).   Pubic lice.   Scabies.  Mites.  Bacterial vaginosis. WHAT ARE CAUSES OF STDs? An STD may be caused by bacteria, a  virus, or parasites. STDs are often transmitted during sexual activity if one person is infected. However, they may also be transmitted through nonsexual means. STDs may be transmitted after:   Sexual intercourse with an infected person.   Sharing sex toys with an infected person.   Sharing needles with an infected person or using unclean piercing or tattoo needles.  Having intimate contact with the genitals, mouth, or rectal areas of an infected person.   Exposure to infected fluids during birth. WHAT ARE THE SIGNS AND SYMPTOMS OF STDs? Different STDs have different symptoms. Some people may not have any symptoms. If symptoms are present, they may include:   Painful or bloody urination.   Pain in the pelvis, abdomen, vagina, anus, throat, or eyes.   A skin rash, itching, or irritation.  Growths, ulcerations, blisters, or sores in the genital and anal areas.  Abnormal vaginal discharge with or without bad odor.   Penile discharge in men.   Fever.   Pain or bleeding during sexual intercourse.   Swollen glands in the groin area.   Yellow skin and eyes (jaundice). This is seen with hepatitis.   Swollen testicles.  Infertility.  Sores and blisters in the mouth. HOW ARE STDs DIAGNOSED? To make a diagnosis, your health care provider may:   Take a medical history.   Perform a physical exam.   Take a sample of any discharge to examine.  Swab the throat, cervix, opening to the penis, rectum, or vagina for testing.  Test a sample of your first morning urine.   Perform blood tests.   Perform a Pap test, if this applies.   Perform a colposcopy.   Perform a laparoscopy.  HOW ARE STDs TREATED? Treatment depends on the STD. Some STDs may be treated but not cured.   Chlamydia, gonorrhea, trichomonas, and syphilis can be cured with antibiotic medicine.   Genital herpes, hepatitis, and HIV can be treated, but not cured, with prescribed medicines. The  medicines lessen symptoms.   Genital warts from HPV can be treated with medicine or by freezing, burning (electrocautery), or surgery. Warts may come back.   HPV cannot be cured with medicine or surgery. However, abnormal areas may be removed from the cervix, vagina, or vulva.   If your diagnosis is confirmed, your recent sexual partners need treatment. This is true even if they are symptom-free or have a negative culture or evaluation. They should not have sex until their health care providers say it is okay. HOW CAN I REDUCE MY RISK OF GETTING AN STD? Take these steps to reduce your risk of getting an STD:  Use latex condoms, dental dams, and water-soluble lubricants during sexual activity. Do not use petroleum jelly or oils.  Avoid having multiple sex partners.  Do not have sex with someone who has other sex partners.  Do not have sex with anyone you do not know or who is at high risk for an STD.  Avoid risky sex practices that can break your skin.  Do not have sex if you have open sores on your mouth or skin.  Avoid drinking too much alcohol or  taking illegal drugs. Alcohol and drugs can affect your judgment and put you in a vulnerable position.  Avoid engaging in oral and anal sex acts.  Get vaccinated for HPV and hepatitis. If you have not received these vaccines in the past, talk to your health care provider about whether one or both might be right for you.   If you are at risk of being infected with HIV, it is recommended that you take a prescription medicine daily to prevent HIV infection. This is called pre-exposure prophylaxis (PrEP). You are considered at risk if:  You are a man who has sex with other men (MSM).  You are a heterosexual man or woman and are sexually active with more than one partner.  You take drugs by injection.  You are sexually active with a partner who has HIV.  Talk with your health care provider about whether you are at high risk of being  infected with HIV. If you choose to begin PrEP, you should first be tested for HIV. You should then be tested every 3 months for as long as you are taking PrEP.  WHAT SHOULD I DO IF I THINK I HAVE AN STD?  See your health care provider.   Tell your sexual partner(s). They should be tested and treated for any STDs.  Do not have sex until your health care provider says it is okay. WHEN SHOULD I GET IMMEDIATE MEDICAL CARE? Contact your health care provider right away if:   You have severe abdominal pain.  You are a man and notice swelling or pain in your testicles.  You are a woman and notice swelling or pain in your vagina. Document Released: 04/02/2002 Document Revised: 01/15/2013 Document Reviewed: 07/31/2012 [facility] Patient Information 2015 [place], [place]. This information is not intended to replace advice given to you by your health care provider. Make sure you discuss any questions you have with your health care provider.

## 2014-03-18 LAB — GC/CHLAMYDIA PROBE AMP (~~LOC~~) NOT AT ARMC
CHLAMYDIA, DNA PROBE: NEGATIVE
Neisseria Gonorrhea: NEGATIVE

## 2015-01-28 ENCOUNTER — Emergency Department (HOSPITAL_BASED_OUTPATIENT_CLINIC_OR_DEPARTMENT_OTHER)
Admission: EM | Admit: 2015-01-28 | Discharge: 2015-01-28 | Disposition: A | Discharge status: Home / Self Care | Payer: BLUE CROSS/BLUE SHIELD | Attending: Emergency Medicine | Admitting: Emergency Medicine

## 2015-01-28 ENCOUNTER — Encounter (HOSPITAL_BASED_OUTPATIENT_CLINIC_OR_DEPARTMENT_OTHER): Payer: Self-pay | Admitting: *Deleted

## 2015-01-28 DIAGNOSIS — N390 Urinary tract infection, site not specified: Secondary | ICD-10-CM | POA: Diagnosis not present

## 2015-01-28 DIAGNOSIS — R55 Syncope and collapse: Secondary | ICD-10-CM | POA: Diagnosis not present

## 2015-01-28 DIAGNOSIS — I951 Orthostatic hypotension: Secondary | ICD-10-CM

## 2015-01-28 DIAGNOSIS — Z8619 Personal history of other infectious and parasitic diseases: Secondary | ICD-10-CM | POA: Diagnosis not present

## 2015-01-28 DIAGNOSIS — R369 Urethral discharge, unspecified: Secondary | ICD-10-CM

## 2015-01-28 DIAGNOSIS — I10 Essential (primary) hypertension: Secondary | ICD-10-CM | POA: Diagnosis not present

## 2015-01-28 DIAGNOSIS — R360 Urethral discharge without blood: Secondary | ICD-10-CM | POA: Diagnosis present

## 2015-01-28 DIAGNOSIS — Z8659 Personal history of other mental and behavioral disorders: Secondary | ICD-10-CM | POA: Diagnosis not present

## 2015-01-28 DIAGNOSIS — Z9889 Other specified postprocedural states: Secondary | ICD-10-CM | POA: Diagnosis not present

## 2015-01-28 DIAGNOSIS — Z8639 Personal history of other endocrine, nutritional and metabolic disease: Secondary | ICD-10-CM | POA: Insufficient documentation

## 2015-01-28 HISTORY — DX: Post-traumatic stress disorder, unspecified: F43.10

## 2015-01-28 LAB — CBC WITH DIFFERENTIAL/PLATELET
Basophils Absolute: 0 10*3/uL (ref 0.0–0.1)
Basophils Relative: 0 %
Eosinophils Absolute: 0.2 10*3/uL (ref 0.0–0.7)
Eosinophils Relative: 2 %
HCT: 45.4 % (ref 39.0–52.0)
Hemoglobin: 15.1 g/dL (ref 13.0–17.0)
Lymphocytes Relative: 23 %
Lymphs Abs: 2.2 10*3/uL (ref 0.7–4.0)
MCH: 27.5 pg (ref 26.0–34.0)
MCHC: 33.3 g/dL (ref 30.0–36.0)
MCV: 82.5 fL (ref 78.0–100.0)
Monocytes Absolute: 0.8 10*3/uL (ref 0.1–1.0)
Monocytes Relative: 8 %
Neutro Abs: 6.5 10*3/uL (ref 1.7–7.7)
Neutrophils Relative %: 67 %
Platelets: 250 10*3/uL (ref 150–400)
RBC: 5.5 MIL/uL (ref 4.22–5.81)
RDW: 14.7 % (ref 11.5–15.5)
WBC: 9.6 10*3/uL (ref 4.0–10.5)

## 2015-01-28 LAB — URINALYSIS, ROUTINE W REFLEX MICROSCOPIC
Bilirubin Urine: NEGATIVE
Glucose, UA: NEGATIVE mg/dL
Hgb urine dipstick: NEGATIVE
Ketones, ur: 15 mg/dL — AB
Nitrite: NEGATIVE
Protein, ur: NEGATIVE mg/dL
Specific Gravity, Urine: 1.027 (ref 1.005–1.030)
pH: 6 (ref 5.0–8.0)

## 2015-01-28 LAB — URINE MICROSCOPIC-ADD ON: RBC / HPF: NONE SEEN RBC/hpf (ref 0–5)

## 2015-01-28 LAB — BASIC METABOLIC PANEL WITH GFR
Anion gap: 5 (ref 5–15)
BUN: 12 mg/dL (ref 6–20)
CO2: 29 mmol/L (ref 22–32)
Calcium: 9.4 mg/dL (ref 8.9–10.3)
Chloride: 108 mmol/L (ref 101–111)
Creatinine, Ser: 1.13 mg/dL (ref 0.61–1.24)
GFR calc Af Amer: >60 mL/min
GFR calc non Af Amer: >60 mL/min
Glucose, Bld: 103 mg/dL — ABNORMAL HIGH (ref 65–99)
Potassium: 4 mmol/L (ref 3.5–5.1)
Sodium: 142 mmol/L (ref 135–145)

## 2015-01-28 MED ORDER — AZITHROMYCIN 250 MG PO TABS
1000.0000 mg | ORAL_TABLET | Freq: Once | ORAL | Status: AC
  Administered 2015-01-28: 1000 mg via ORAL
  Filled 2015-01-28: qty 4

## 2015-01-28 MED ORDER — CEPHALEXIN 500 MG PO CAPS: 500.0000 mg | ORAL_CAPSULE | Freq: Four times a day (QID) | ORAL | Status: DC

## 2015-01-28 MED ORDER — LIDOCAINE HCL (PF) 1 % IJ SOLN
INTRAMUSCULAR | Status: AC
  Administered 2015-01-28: 0.9 mL
  Filled 2015-01-28: qty 5

## 2015-01-28 MED ORDER — CEFTRIAXONE SODIUM 250 MG IJ SOLR
250.0000 mg | Freq: Once | INTRAMUSCULAR | Status: AC
  Administered 2015-01-28: 250 mg via INTRAMUSCULAR
  Filled 2015-01-28: qty 250

## 2015-01-28 MED FILL — CEPHALEXIN 500 MG CAPSULE: 500 | 5 days supply | Qty: 20 | Fill #0

## 2015-01-28 NOTE — ED Provider Notes (Signed)
CSN: [id number]     Arrival date & time 01/28/15  0907 History   First MD Initiated Contact with Patient 01/28/15 1023     Chief Complaint  Patient presents with  . Near Syncope  . Penile Discharge     (Consider location/radiation/quality/duration/timing/severity/associated sxs/prior Treatment) HPI 40 year old male who presents with penile discharge and near syncope Woke up this morning at 4;30 AM and while getting up got a head rush. Thought it would go away, but while walking  throughout the hallway passed out. Felt hot and disoriented and nausea. Then woke up on the ground. Decreased PO intake recently due to stress and constantly working. On his feet all day and usually unable to eat or drink much throughout the day.  History of UTI and with penile discharge and dysuria for past few days.  No recent fever, chills, abdominal pain, cough, URI sx or GI sx.  No chest pain or difficulty breathing.  No history of syncope or exertional syncope.   Past Medical History  Diagnosis Date  . Chicken pox as a child  . Elevated BP 04/04/2013  . Grief reaction 04/04/2013  . Unspecified hypothyroidism 04/07/2013  . HTN (hypertension) 04/04/2013  . PTSD (post-traumatic stress disorder)    Past Surgical History  Procedure Laterality Date  . Hydrocele excision / repair  40 years old  . Wisdom tooth extraction  2010   Family History  Problem Relation Age of Onset  . Congestive Heart Failure Father   . Hyperlipidemia Father   . Hypertension Father   . Kidney disease Maternal Grandmother   . Diabetes Maternal Grandmother     type 2  . Stroke Maternal Grandmother   . Alcohol abuse Paternal Grandmother   . Pneumonia Paternal Grandfather   . Asthma Brother    Social History  Substance Use Topics  . Smoking status: Current Every Day Smoker -- 0.50 packs/day for .8 years    Types: Cigarettes  . Smokeless tobacco: Never Used     Comment: quit in Nov 2014 but started back 03-18-13  . Alcohol  Use: Yes     Comment: occasionally    Review of Systems 10/14 systems reviewed and are negative other than those stated in the HPI    Allergies  Review of patient's allergies indicates no known allergies.  Home Medications   Prior to Admission medications   Medication Sig Start Date End Date Taking? Authorizing Provider  cephALEXin (KEFLEX) 500 MG capsule Take 1 capsule (500 mg total) by mouth 4 (four) times daily. 01/28/15   Forde Dandy, MD   BP 139/99 mmHg  Pulse 70  Temp(Src) 98.3 F (36.8 C) (Oral)  Resp 20  Ht 6\' 2"  (1.88 m)  Wt 242 lb 8 oz (109.997 kg)  BMI 31.12 kg/m2  SpO2 100% Physical Exam Physical Exam  Nursing note and vitals reviewed. Constitutional: Well developed, well nourished, non-toxic, and in no acute distress Head: Normocephalic and atraumatic.  Mouth/Throat: Oropharynx is clear. Mucous membranes are dry.  Neck: Normal range of motion. Neck supple.  Cardiovascular: Normal rate and regular rhythm.   Pulmonary/Chest: Effort normal and breath sounds normal.  Abdominal: Soft. There is no tenderness. There is no rebound and no guarding.  Musculoskeletal: Normal range of motion.  Neurological: Alert, no facial droop, fluent speech, moves all extremities symmetrically Skin: Skin is warm and dry.  Psychiatric: Cooperative  ED Course  Procedures (including critical care time) Labs Review Labs Reviewed  BASIC METABOLIC PANEL -  Abnormal; Notable for the following:    Glucose, Bld 103 (*)    All other components within normal limits  URINALYSIS, ROUTINE W REFLEX MICROSCOPIC (NOT AT [facility]) - Abnormal; Notable for the following:    Ketones, ur 15 (*)    Leukocytes, UA MODERATE (*)    All other components within normal limits  URINE MICROSCOPIC-ADD ON - Abnormal; Notable for the following:    Squamous Epithelial / LPF 0-5 (*)    Bacteria, UA MANY (*)    All other components within normal limits  URINE CULTURE  CBC WITH DIFFERENTIAL/PLATELET  RPR  HIV  ANTIBODY (ROUTINE TESTING)  GC/CHLAMYDIA PROBE AMP ([place]) NOT AT [facility]    Imaging Review No results found. I have personally reviewed and evaluated these images and lab results as part of my medical decision-making.   EKG Interpretation   Date/Time:  Wednesday January 28 2015 11:09:42 EST Ventricular Rate:  63 PR Interval:  187 QRS Duration: 91 QT Interval:  414 QTC Calculation: 424 R Axis:   20 Text Interpretation:  Sinus rhythm Probable anteroseptal infarct, old No  prior for comparison Confirmed by Charnae Lill MD, Cheyanna Strick [phone]) on 01/28/2015  11:53:56 AM      MDM   Final diagnoses:  Orthostatic syncope  Penile discharge  Urinary tract infection without hematuria, site unspecified    40 year old male who presents with near syncope and penile discharge. Is well-appearing and in no acute distress. Vital signs are within normal limits. His exam is overall unremarkable, but he does look mildly dry on exam. Given risk factors, STD screen was obtained.   he is empirically treated for STDs with ceftriaxone and azithromycin. UA also suggestive of urinary tract infection, and given a course of Keflex with urine sent for culture. Remainder basic blood work is unremarkable. Syncopal episode seems orthostatic in nature given that this he was symptomatic with sudden changes of in position and given his significantly decreased by mouth intake at work to distress. EKG without stigmata of arrhythmia, and no concerning features on history that would suggest cardiogenic or other serious etiology of syncope. Discussed supportive care for home and increase by mouth intake. Strict return and follow-up instructions are reviewed. He expressed understanding of all discharge instructions and felt comfortable to plan of care  Forde Dandy, MD 01/29/15 [phone]

## 2015-01-28 NOTE — ED Notes (Addendum)
Dizziness this morning and felt disoriented. States he next knew he was on carpeted floor.  No other sx. Also c/o pus colored discharge from penis.

## 2015-01-28 NOTE — Discharge Instructions (Signed)
Return for worsening symptoms, including fever, worsening pain, vomiting and unable to keep down food/fluids, or any other symptoms concerning to you. Take antibiotics as prescribed for your UTI You were treated for STDs but your culture is still pending.  Orthostatic Hypotension Orthostatic hypotension is a sudden drop in blood pressure. It happens when you quickly stand up from a seated or lying position. You may feel dizzy or light-headed. This can last for just a few seconds or for up to a few minutes. It is usually not a serious problem. However, if this happens frequently or gets worse, it can be a sign of something more serious. CAUSES  Different things can cause orthostatic hypotension, including:   Loss of body fluids (dehydration).  Medicines that lower blood pressure.  Sudden changes in posture, such as standing up quickly after you have been sitting or lying down.  Taking too much of your medicine. SIGNS AND SYMPTOMS   Light-headedness or dizziness.   Fainting or near-fainting.   A fast heart rate.   Weakness.   Feeling tired (fatigue).  DIAGNOSIS  Your health care provider may do several things to help diagnose your condition and identify the cause. These may include:   Taking a medical history and doing a physical exam.  Checking your blood pressure. Your health care provider will check your blood pressure when you are:  Lying down.  Sitting.  Standing.  Using tilt table testing. In this test, you lie down on a table that moves from a lying position to a standing position. You will be strapped onto the table. This test monitors your blood pressure and heart rate when you are in different positions. TREATMENT  Treatment will vary depending on the cause. Possible treatments include:   Changing the dosage of your medicines.  Wearing compression stockings on your lower legs.  Standing up slowly after sitting or lying down.  Eating more salt.  Eating  frequent, small meals.  In some cases, getting IV fluids.  Taking medicine to enhance fluid retention. HOME CARE INSTRUCTIONS  Only take over-the-counter or prescription medicines as directed by your health care provider.  Follow your health care provider's instructions for changing the dosage of your current medicines.  Do not stop or adjust your medicine on your own.  Stand up slowly after sitting or lying down. This allows your body to adjust to the different position.  Wear compression stockings as directed.  Eat extra salt as directed.  Do not add extra salt to your diet unless directed to by your health care provider.  Eat frequent, small meals.  Avoid standing suddenly after eating.  Avoid hot showers or excessive heat as directed by your health care provider.  Keep all follow-up appointments. SEEK MEDICAL CARE IF:  You continue to feel dizzy or light-headed after standing.  You feel groggy or confused.  You feel cold, clammy, or sick to your stomach (nauseous).  You have blurred vision.  You feel short of breath. SEEK IMMEDIATE MEDICAL CARE IF:   You faint after standing.  You have chest pain.  You have difficulty breathing.   You lose feeling or movement in your arms or legs.   You have slurred speech or difficulty talking, or you are unable to talk.  MAKE SURE YOU:   Understand these instructions.  Will watch your condition.  Will get help right away if you are not doing well or get worse.   This information is not intended to replace advice given  to you by your health care provider. Make sure you discuss any questions you have with your health care provider.   Document Released: 12/31/2001 Document Revised: 01/15/2013 Document Reviewed: 11/02/2012 Elsevier Interactive Patient Education 2016 Else[employer]  Urinary Tract Infection Urinary tract infections (UTIs) can develop anywhere along your urinary tract. Your urinary tract is your  body's drainage system for removing wastes and extra water. Your urinary tract includes two kidneys, two ureters, a bladder, and a urethra. Your kidneys are a pair of bean-shaped organs. Each kidney is about the size of your fist. They are located below your ribs, one on each side of your spine. CAUSES Infections are caused by microbes, which are microscopic organisms, including fungi, viruses, and bacteria. These organisms are so small that they can only be seen through a microscope. Bacteria are the microbes that most commonly cause UTIs. SYMPTOMS  Symptoms of UTIs may vary by age and gender of the patient and by the location of the infection. Symptoms in young women typically include a frequent and intense urge to urinate and a painful, burning feeling in the bladder or urethra during urination. Older women and men are more likely to be tired, shaky, and weak and have muscle aches and abdominal pain. A fever may mean the infection is in your kidneys. Other symptoms of a kidney infection include pain in your back or sides below the ribs, nausea, and vomiting. DIAGNOSIS To diagnose a UTI, your caregiver will ask you about your symptoms. Your caregiver will also ask you to provide a urine sample. The urine sample will be tested for bacteria and white blood cells. White blood cells are made by your body to help fight infection. TREATMENT  Typically, UTIs can be treated with medication. Because most UTIs are caused by a bacterial infection, they usually can be treated with the use of antibiotics. The choice of antibiotic and length of treatment depend on your symptoms and the type of bacteria causing your infection. HOME CARE INSTRUCTIONS  If you were prescribed antibiotics, take them exactly as your caregiver instructs you. Finish the medication even if you feel better after you have only taken some of the medication.  Drink enough water and fluids to keep your urine clear or pale yellow.  Avoid  caffeine, tea, and carbonated beverages. They tend to irritate your bladder.  Empty your bladder often. Avoid holding urine for long periods of time.  Empty your bladder before and after sexual intercourse.  After a bowel movement, women should cleanse from front to back. Use each tissue only once. SEEK MEDICAL CARE IF:   You have back pain.  You develop a fever.  Your symptoms do not begin to resolve within 3 days. SEEK IMMEDIATE MEDICAL CARE IF:   You have severe back pain or lower abdominal pain.  You develop chills.  You have nausea or vomiting.  You have continued burning or discomfort with urination. MAKE SURE YOU:   Understand these instructions.  Will watch your condition.  Will get help right away if you are not doing well or get worse.   This information is not intended to replace advice given to you by your health care provider. Make sure you discuss any questions you have with your health care provider.   Document Released: 10/20/2004 Document Revised: 10/01/2014 Document Reviewed: 02/18/2011 Elsevier Interactive Patient Education [employer].

## 2015-01-28 NOTE — ED Notes (Signed)
Reports he got up at 0430 for work and became lightheaded while walking in hallway.

## 2015-01-29 LAB — URINE CULTURE: Culture: NO GROWTH

## 2015-01-29 LAB — GC/CHLAMYDIA PROBE AMP (~~LOC~~) NOT AT ARMC
CHLAMYDIA, DNA PROBE: NEGATIVE
Neisseria Gonorrhea: NEGATIVE

## 2015-01-29 LAB — RPR: RPR Ser Ql: NONREACTIVE

## 2015-01-29 LAB — HIV ANTIBODY (ROUTINE TESTING W REFLEX): HIV Screen 4th Generation wRfx: NONREACTIVE

## 2015-02-12 ENCOUNTER — Encounter (HOSPITAL_BASED_OUTPATIENT_CLINIC_OR_DEPARTMENT_OTHER): Payer: Self-pay | Admitting: Emergency Medicine

## 2015-02-12 ENCOUNTER — Emergency Department (HOSPITAL_BASED_OUTPATIENT_CLINIC_OR_DEPARTMENT_OTHER)
Admission: EM | Admit: 2015-02-12 | Discharge: 2015-02-12 | Disposition: A | Discharge status: Home / Self Care | Payer: BLUE CROSS/BLUE SHIELD | Attending: Emergency Medicine | Admitting: Emergency Medicine

## 2015-02-12 DIAGNOSIS — I10 Essential (primary) hypertension: Secondary | ICD-10-CM | POA: Diagnosis not present

## 2015-02-12 DIAGNOSIS — Z8659 Personal history of other mental and behavioral disorders: Secondary | ICD-10-CM | POA: Diagnosis not present

## 2015-02-12 DIAGNOSIS — Z792 Long term (current) use of antibiotics: Secondary | ICD-10-CM | POA: Insufficient documentation

## 2015-02-12 DIAGNOSIS — Z8639 Personal history of other endocrine, nutritional and metabolic disease: Secondary | ICD-10-CM | POA: Diagnosis not present

## 2015-02-12 DIAGNOSIS — R109 Unspecified abdominal pain: Secondary | ICD-10-CM | POA: Diagnosis present

## 2015-02-12 DIAGNOSIS — N390 Urinary tract infection, site not specified: Secondary | ICD-10-CM | POA: Diagnosis not present

## 2015-02-12 DIAGNOSIS — Z8619 Personal history of other infectious and parasitic diseases: Secondary | ICD-10-CM | POA: Insufficient documentation

## 2015-02-12 DIAGNOSIS — F17210 Nicotine dependence, cigarettes, uncomplicated: Secondary | ICD-10-CM | POA: Diagnosis not present

## 2015-02-12 LAB — URINE MICROSCOPIC-ADD ON

## 2015-02-12 LAB — URINALYSIS, ROUTINE W REFLEX MICROSCOPIC
Bilirubin Urine: NEGATIVE
Glucose, UA: NEGATIVE mg/dL
KETONES UR: NEGATIVE mg/dL
NITRITE: NEGATIVE
PROTEIN: NEGATIVE mg/dL
Specific Gravity, Urine: 1.024 (ref 1.005–1.030)
pH: 5 (ref 5.0–8.0)

## 2015-02-12 LAB — GC/CHLAMYDIA PROBE AMP (~~LOC~~) NOT AT ARMC
Chlamydia: NEGATIVE
Neisseria Gonorrhea: NEGATIVE

## 2015-02-12 MED ORDER — NITROFURANTOIN MONOHYD MACRO 100 MG PO CAPS: 100.0000 mg | ORAL_CAPSULE | Freq: Two times a day (BID) | ORAL | Status: DC

## 2015-02-12 MED ORDER — PHENAZOPYRIDINE HCL 200 MG PO TABS: 200.0000 mg | ORAL_TABLET | Freq: Three times a day (TID) | ORAL | Status: DC | PRN

## 2015-02-12 MED ORDER — CEFTRIAXONE SODIUM 1 G IJ SOLR
1.0000 g | Freq: Once | INTRAMUSCULAR | Status: AC
  Administered 2015-02-12: 1 g via INTRAMUSCULAR
  Filled 2015-02-12: qty 10

## 2015-02-12 MED ORDER — LIDOCAINE HCL (PF) 1 % IJ SOLN
INTRAMUSCULAR | Status: AC
  Administered 2015-02-12: 1.2 mL
  Filled 2015-02-12: qty 5

## 2015-02-12 MED FILL — NITROFURANTOIN MONO-MCR 100: 100 | 7 days supply | Qty: 14 | Fill #0

## 2015-02-12 MED FILL — PHENAZOPYRIDINE 200 MG TAB: 200 | 2 days supply | Qty: 6 | Fill #0

## 2015-02-12 NOTE — ED Provider Notes (Signed)
CSN: [id number]     Arrival date & time 02/12/15  0556 History   First MD Initiated Contact with Patient 02/12/15 [phone]     Chief Complaint  Patient presents with  . Urinary Tract Infection     (Consider location/radiation/quality/duration/timing/severity/associated sxs/prior Treatment) Patient is a 40 y.o. male presenting with urinary tract infection. The history is provided by the patient. No language interpreter was used.  Urinary Tract Infection This is a recurrent problem. The current episode started more than 1 week ago. The problem occurs constantly. The problem has not changed since onset.Pertinent negatives include no chest pain, no abdominal pain, no headaches and no shortness of breath. Nothing aggravates the symptoms. Nothing relieves the symptoms. He has tried nothing for the symptoms. The treatment provided no relief.    Past Medical History  Diagnosis Date  . Chicken pox as a child  . Elevated BP 04/04/2013  . Grief reaction 04/04/2013  . Unspecified hypothyroidism 04/07/2013  . HTN (hypertension) 04/04/2013  . PTSD (post-traumatic stress disorder)    Past Surgical History  Procedure Laterality Date  . Hydrocele excision / repair  40 years old  . Wisdom tooth extraction  2010   Family History  Problem Relation Age of Onset  . Congestive Heart Failure Father   . Hyperlipidemia Father   . Hypertension Father   . Kidney disease Maternal Grandmother   . Diabetes Maternal Grandmother     type 2  . Stroke Maternal Grandmother   . Alcohol abuse Paternal Grandmother   . Pneumonia Paternal Grandfather   . Asthma Brother    Social History  Substance Use Topics  . Smoking status: Current Every Day Smoker -- 0.50 packs/day for .8 years    Types: Cigarettes  . Smokeless tobacco: Never Used     Comment: quit in Nov 2014 but started back 03-18-13  . Alcohol Use: Yes     Comment: occasionally    Review of Systems  Respiratory: Negative for shortness of breath.    Cardiovascular: Negative for chest pain.  Gastrointestinal: Negative for abdominal pain.  Neurological: Negative for headaches.  All other systems reviewed and are negative.     Allergies  Review of patient's allergies indicates no known allergies.  Home Medications   Prior to Admission medications   Medication Sig Start Date End Date Taking? Authorizing Provider  cephALEXin (KEFLEX) 500 MG capsule Take 1 capsule (500 mg total) by mouth 4 (four) times daily. 01/28/15   Forde Dandy, MD  nitrofurantoin, macrocrystal-monohydrate, (MACROBID) 100 MG capsule Take 1 capsule (100 mg total) by mouth 2 (two) times daily. X 7 days 02/12/15   Keagan Brislin, MD  phenazopyridine (PYRIDIUM) 200 MG tablet Take 1 tablet (200 mg total) by mouth 3 (three) times daily as needed for pain. 02/12/15   Hilari Wethington, MD   BP 163/105 mmHg  Pulse 72  Temp(Src) 98.1 F (36.7 C) (Oral)  Resp 18  Ht 6\' 2"  (1.88 m)  Wt 242 lb (109.77 kg)  BMI 31.06 kg/m2  SpO2 100% Physical Exam  Constitutional: He is oriented to person, place, and time. He appears well-developed and well-nourished. No distress.  HENT:  Head: Normocephalic and atraumatic.  Mouth/Throat: Oropharynx is clear and moist.  Eyes: Conjunctivae are normal. Pupils are equal, round, and reactive to light.  Neck: Normal range of motion. Neck supple.  Cardiovascular: Normal rate, regular rhythm and intact distal pulses.   Pulmonary/Chest: Effort normal and breath sounds normal. No respiratory distress. He has no  wheezes. He has no rales.  Abdominal: Soft. Bowel sounds are normal. There is no tenderness. There is no rebound and no guarding.  Musculoskeletal: Normal range of motion.  Neurological: He is alert and oriented to person, place, and time. He has normal reflexes.  Skin: Skin is warm and dry.  Psychiatric: He has a normal mood and affect.    ED Course  Procedures (including critical care time) Labs Review Labs Reviewed  URINALYSIS,  ROUTINE W REFLEX MICROSCOPIC (NOT AT [facility] - Abnormal; Notable for the following:    Hgb urine dipstick TRACE (*)    Leukocytes, UA MODERATE (*)    All other components within normal limits  URINE MICROSCOPIC-ADD ON - Abnormal; Notable for the following:    Squamous Epithelial / LPF 0-5 (*)    Bacteria, UA FEW (*)    All other components within normal limits  URINE CULTURE  GC/CHLAMYDIA PROBE AMP ([place]) NOT AT [facility]    Imaging Review No results found. I have personally reviewed and evaluated these images and lab results as part of my medical decision-making.   EKG Interpretation None      MDM   Final diagnoses:  UTI (lower urinary tract infection)   UTi improved but not resolved studies for STIs are negative with keflex with give IM rocephin and send with RX for macrobid  And follow up with urology.  Strict return precautions   Myles Tavella, MD 02/12/15 [id number]

## 2015-02-12 NOTE — ED Notes (Signed)
Urine specimen requested strait cath offered

## 2015-02-12 NOTE — ED Notes (Signed)
Pt reports having penial discharge and flank pain

## 2015-02-13 ENCOUNTER — Telehealth (HOSPITAL_COMMUNITY): Payer: Self-pay

## 2015-02-13 LAB — URINE CULTURE
Culture: NO GROWTH
Special Requests: NORMAL

## 2015-07-16 ENCOUNTER — Emergency Department (HOSPITAL_BASED_OUTPATIENT_CLINIC_OR_DEPARTMENT_OTHER)
Admission: EM | Admit: 2015-07-16 | Discharge: 2015-07-16 | Disposition: A | Discharge status: Home / Self Care | Payer: BLUE CROSS/BLUE SHIELD | Attending: Emergency Medicine | Admitting: Emergency Medicine

## 2015-07-16 ENCOUNTER — Encounter (HOSPITAL_BASED_OUTPATIENT_CLINIC_OR_DEPARTMENT_OTHER): Payer: Self-pay | Admitting: *Deleted

## 2015-07-16 ENCOUNTER — Emergency Department (HOSPITAL_BASED_OUTPATIENT_CLINIC_OR_DEPARTMENT_OTHER): Payer: BLUE CROSS/BLUE SHIELD

## 2015-07-16 ENCOUNTER — Other Ambulatory Visit: Payer: Self-pay

## 2015-07-16 DIAGNOSIS — E039 Hypothyroidism, unspecified: Secondary | ICD-10-CM | POA: Insufficient documentation

## 2015-07-16 DIAGNOSIS — F17210 Nicotine dependence, cigarettes, uncomplicated: Secondary | ICD-10-CM | POA: Insufficient documentation

## 2015-07-16 DIAGNOSIS — K219 Gastro-esophageal reflux disease without esophagitis: Secondary | ICD-10-CM | POA: Insufficient documentation

## 2015-07-16 DIAGNOSIS — I10 Essential (primary) hypertension: Secondary | ICD-10-CM | POA: Insufficient documentation

## 2015-07-16 DIAGNOSIS — R0989 Other specified symptoms and signs involving the circulatory and respiratory systems: Secondary | ICD-10-CM | POA: Diagnosis not present

## 2015-07-16 LAB — BASIC METABOLIC PANEL
Anion gap: 6 (ref 5–15)
BUN: 16 mg/dL (ref 6–20)
CHLORIDE: 105 mmol/L (ref 101–111)
CO2: 28 mmol/L (ref 22–32)
Calcium: 9.4 mg/dL (ref 8.9–10.3)
Creatinine, Ser: 1.19 mg/dL (ref 0.61–1.24)
GFR calc Af Amer: >60 mL/min (ref >60)
GLUCOSE: 111 mg/dL — AB (ref 65–99)
POTASSIUM: 3.6 mmol/L (ref 3.5–5.1)
Sodium: 139 mmol/L (ref 135–145)

## 2015-07-16 LAB — CBC WITH DIFFERENTIAL/PLATELET
BASOS ABS: 0 10*3/uL (ref 0.0–0.1)
BASOS PCT: 0 %
EOS ABS: 0.2 10*3/uL (ref 0.0–0.7)
EOS PCT: 2 %
HCT: 40.9 % (ref 39.0–52.0)
HEMOGLOBIN: 13.9 g/dL (ref 13.0–17.0)
Lymphocytes Relative: 20 %
Lymphs Abs: 2.9 10*3/uL (ref 0.7–4.0)
MCH: 28.3 pg (ref 26.0–34.0)
MCHC: 34 g/dL (ref 30.0–36.0)
MCV: 83.3 fL (ref 78.0–100.0)
Monocytes Absolute: 1 10*3/uL (ref 0.1–1.0)
Monocytes Relative: 7 %
Neutro Abs: 10.1 10*3/uL — ABNORMAL HIGH (ref 1.7–7.7)
Neutrophils Relative %: 71 %
PLATELETS: 236 10*3/uL (ref 150–400)
RBC: 4.91 MIL/uL (ref 4.22–5.81)
RDW: 14.7 % (ref 11.5–15.5)
WBC: 14.2 10*3/uL — ABNORMAL HIGH (ref 4.0–10.5)

## 2015-07-16 LAB — TROPONIN I

## 2015-07-16 MED ORDER — GI COCKTAIL ~~LOC~~
30.0000 mL | Freq: Once | ORAL | Status: AC
  Administered 2015-07-16: 30 mL via ORAL
  Filled 2015-07-16: qty 30

## 2015-07-16 MED ORDER — PANTOPRAZOLE SODIUM 20 MG PO TBEC: 20.0000 mg | DELAYED_RELEASE_TABLET | Freq: Every day | ORAL | Status: DC

## 2015-07-16 MED ORDER — SUCRALFATE 1 GM/10ML PO SUSP: 1.0000 g | Freq: Three times a day (TID) | ORAL | Status: DC

## 2015-07-16 MED ORDER — FAMOTIDINE 20 MG PO TABS
20.0000 mg | ORAL_TABLET | Freq: Once | ORAL | Status: AC
  Administered 2015-07-16: 20 mg via ORAL
  Filled 2015-07-16: qty 1

## 2015-07-16 MED ORDER — ONDANSETRON 8 MG PO TBDP
8.0000 mg | ORAL_TABLET | Freq: Once | ORAL | Status: AC
  Administered 2015-07-16: 8 mg via ORAL
  Filled 2015-07-16: qty 1

## 2015-07-16 MED ORDER — SUCRALFATE 1 G PO TABS
1.0000 g | ORAL_TABLET | Freq: Once | ORAL | Status: AC
  Administered 2015-07-16: 1 g via ORAL
  Filled 2015-07-16: qty 1

## 2015-07-16 NOTE — ED Notes (Signed)
Feels like something stuck in throat onset yesterday am

## 2015-07-16 NOTE — ED Notes (Signed)
Throat , epigastric discomfort onset yesterday onset after eating  Feels lik something stuck in throat

## 2015-07-16 NOTE — ED Provider Notes (Signed)
CSN: [id number]     Arrival date & time 07/16/15  0450 History   First MD Initiated Contact with Patient 07/16/15 0505     Chief Complaint  Patient presents with  . Gastroesophageal Reflux     (Consider location/radiation/quality/duration/timing/severity/associated sxs/prior Treatment) Patient is a 40 y.o. male presenting with GERD. The history is provided by the patient.  Gastroesophageal Reflux This is a new problem. The current episode started yesterday. The problem occurs constantly. The problem has not changed since onset.Pertinent negatives include no abdominal pain, no headaches and no shortness of breath. Nothing aggravates the symptoms. Nothing relieves the symptoms. He has tried nothing for the symptoms. The treatment provided no relief.  Started post eating yesterday and has epigastric pain all the way into the neck and states it feels like things are getting stuck but is able to hold down liquids.  No sore throat.  No neck swelling no cough.  No swelling or pain in the legs.    Past Medical History  Diagnosis Date  . Chicken pox as a child  . Elevated BP 04/04/2013  . Grief reaction 04/04/2013  . Unspecified hypothyroidism 04/07/2013  . HTN (hypertension) 04/04/2013  . PTSD (post-traumatic stress disorder)    Past Surgical History  Procedure Laterality Date  . Hydrocele excision / repair  40 years old  . Wisdom tooth extraction  2010   Family History  Problem Relation Age of Onset  . Congestive Heart Failure Father   . Hyperlipidemia Father   . Hypertension Father   . Kidney disease Maternal Grandmother   . Diabetes Maternal Grandmother     type 2  . Stroke Maternal Grandmother   . Alcohol abuse Paternal Grandmother   . Pneumonia Paternal Grandfather   . Asthma Brother    Social History  Substance Use Topics  . Smoking status: Current Every Day Smoker -- 0.50 packs/day for .8 years    Types: Cigarettes  . Smokeless tobacco: Never Used     Comment: quit in Nov  2014 but started back 03-18-13  . Alcohol Use: Yes     Comment: occasionally    Review of Systems  Constitutional: Negative for fever and appetite change.  HENT: Negative for sore throat, trouble swallowing and voice change.   Respiratory: Negative for shortness of breath.   Cardiovascular: Negative for palpitations and leg swelling.  Gastrointestinal: Negative for vomiting and abdominal pain.  Neurological: Negative for headaches.  All other systems reviewed and are negative.     Allergies  Review of patient's allergies indicates no known allergies.  Home Medications   Prior to Admission medications   Medication Sig Start Date End Date Taking? Authorizing Provider  cephALEXin (KEFLEX) 500 MG capsule Take 1 capsule (500 mg total) by mouth 4 (four) times daily. 01/28/15   Forde Dandy, MD  nitrofurantoin, macrocrystal-monohydrate, (MACROBID) 100 MG capsule Take 1 capsule (100 mg total) by mouth 2 (two) times daily. X 7 days 02/12/15   Rishi Vicario, MD  phenazopyridine (PYRIDIUM) 200 MG tablet Take 1 tablet (200 mg total) by mouth 3 (three) times daily as needed for pain. 02/12/15   Gotti Alwin, MD   BP 153/101 mmHg  Pulse 92  Temp(Src) 98.9 F (37.2 C) (Oral)  Resp 18  Ht 6\' 2"  (1.88 m)  Wt 252 lb 8 oz (114.533 kg)  BMI 32.41 kg/m2  SpO2 99% Physical Exam  Constitutional: He is oriented to person, place, and time. He appears well-developed and well-nourished. No distress.  HENT:  Head: Normocephalic and atraumatic.  Mouth/Throat: Oropharynx is clear and moist.  Eyes: EOM are normal. Pupils are equal, round, and reactive to light.  Neck: Normal range of motion. Neck supple. No tracheal deviation present.  No pain with displacement of the trachea, intact phonation  Cardiovascular: Normal rate, regular rhythm and intact distal pulses.   Pulmonary/Chest: Effort normal and breath sounds normal. No stridor. No respiratory distress. He has no wheezes. He has no rales.  Abdominal:  Soft. Bowel sounds are increased. There is no tenderness. There is no rigidity, no rebound, no guarding, no tenderness at McBurney's point and negative Murphy's sign.  Musculoskeletal: Normal range of motion. He exhibits no edema or tenderness.  Lymphadenopathy:    He has no cervical adenopathy.  Neurological: He is alert and oriented to person, place, and time.  Skin: Skin is warm and dry.  Psychiatric: He has a normal mood and affect.    ED Course  Procedures (including critical care time) Labs Review Labs Reviewed  CBC WITH DIFFERENTIAL/PLATELET - Abnormal; Notable for the following:    WBC 14.2 (*)    Neutro Abs 10.1 (*)    All other components within normal limits  BASIC METABOLIC PANEL - Abnormal; Notable for the following:    Glucose, Bld 111 (*)    All other components within normal limits  TROPONIN I    Imaging Review No results found. I have personally reviewed and evaluated these images and lab results as part of my medical decision-making.   EKG Interpretation   Date/Time:  Thursday July 16 2015 05:14:27 EDT Ventricular Rate:  89 PR Interval:    QRS Duration: 92 QT Interval:  361 QTC Calculation: 440 R Axis:   0 Text Interpretation:  Sinus rhythm Confirmed by [facility]  MD, Clarece Drzewiecki  ([id number]) on 07/16/2015 5:42:54 AM     Results for orders placed or performed during the hospital encounter of 07/16/15  CBC with Differential/Platelet  Result Value Ref Range   WBC 14.2 (H) 4.0 - 10.5 K/uL   RBC 4.91 4.22 - 5.81 MIL/uL   Hemoglobin 13.9 13.0 - 17.0 g/dL   HCT 40.9 39.0 - 52.0 %   MCV 83.3 78.0 - 100.0 fL   MCH 28.3 26.0 - 34.0 pg   MCHC 34.0 30.0 - 36.0 g/dL   RDW 14.7 11.5 - 15.5 %   Platelets 236 150 - 400 K/uL   Neutrophils Relative % 71 %   Neutro Abs 10.1 (H) 1.7 - 7.7 K/uL   Lymphocytes Relative 20 %   Lymphs Abs 2.9 0.7 - 4.0 K/uL   Monocytes Relative 7 %   Monocytes Absolute 1.0 0.1 - 1.0 K/uL   Eosinophils Relative 2 %   Eosinophils Absolute  0.2 0.0 - 0.7 K/uL   Basophils Relative 0 %   Basophils Absolute 0.0 0.0 - 0.1 K/uL  Basic metabolic panel  Result Value Ref Range   Sodium 139 135 - 145 mmol/L   Potassium 3.6 3.5 - 5.1 mmol/L   Chloride 105 101 - 111 mmol/L   CO2 28 22 - 32 mmol/L   Glucose, Bld 111 (H) 65 - 99 mg/dL   BUN 16 6 - 20 mg/dL   Creatinine, Ser 1.19 0.61 - 1.24 mg/dL   Calcium 9.4 8.9 - 10.3 mg/dL   GFR calc non Af Amer >60 >60 mL/min   GFR calc Af Amer >60 >60 mL/min   Anion gap 6 5 - 15  Troponin I  Result Value  Ref Range   Troponin I <0.03 <0.031 ng/mL   Dg Neck Soft Tissue  07/16/2015  CLINICAL DATA:  40 year old male feels a lump in his throat after eating EXAM: NECK SOFT TISSUES - 1+ VIEW COMPARISON:  None. FINDINGS: There is no evidence of retropharyngeal soft tissue swelling or epiglottic enlargement. The cervical airway is unremarkable and no radio-opaque foreign body identified. IMPRESSION: Negative. Electronically Signed   By: Anner Crete M.D.   On: 07/16/2015 05:53   Dg Chest 2 View  07/16/2015  ADDENDUM REPORT: 07/16/2015 06:01 ADDENDUM: Please note in the is a typographical error in the impression of the report. The correct sentence should read: Patent central airways.  No radiopaque foreign object. Electronically Signed   By: Anner Crete M.D.   On: 07/16/2015 06:01  07/16/2015  CLINICAL DATA:  40 year old male feeling a lump in his throat after eating. EXAM: CHEST  2 VIEW COMPARISON:  None. FINDINGS: Two views of the chest demonstrate clear lungs. No pleural effusion or pneumothorax. The central airways appear patent. No radiopaque foreign object noted. The cardiac silhouette is within normal limits. No acute osseous pathology. IMPRESSION: No active cardiopulmonary disease. Peak in central airways.  No radiopaque foreign object. Electronically Signed: By: Anner Crete M.D. On: 07/16/2015 05:52     MDM   Final diagnoses:  None   Filed Vitals:   07/16/15 0455  BP: 153/101   Pulse: 92  Temp: 98.9 F (37.2 C)  Resp: 18    Medications  sucralfate (CARAFATE) tablet 1 g (not administered)  famotidine (PEPCID) tablet 20 mg (not administered)  gi cocktail (Maalox,Lidocaine,Donnatal) (30 mLs Oral Given 07/16/15 0518)  ondansetron (ZOFRAN-ODT) disintegrating tablet 8 mg (8 mg Oral Given 07/16/15 0516)  PERC negative wells 0, highly doubt PE as symptoms are not consistent with same and patient is extremely low risk.  Ruled out for MI with ongoing symptoms on > 12 hours with a normal EKG and troponin.     HEARt score is 1  Symptoms consistent with GERD and patient had relief with GERD meds.  Will start gerd friendly diet and medication at home and have patient follow up with his PMD for ongoing care.  Strict return precautions given    Larri Yehle, MD 07/16/15 [phone]

## 2015-07-16 NOTE — ED Notes (Signed)
Returned from xray

## 2015-07-16 NOTE — ED Notes (Addendum)
Reflux education provided at length to patient.

## 2016-02-25 DIAGNOSIS — Z113 Encounter for screening for infections with a predominantly sexual mode of transmission: Secondary | ICD-10-CM | POA: Diagnosis not present

## 2016-02-25 DIAGNOSIS — R369 Urethral discharge, unspecified: Secondary | ICD-10-CM | POA: Diagnosis not present

## 2016-04-18 DIAGNOSIS — R030 Elevated blood-pressure reading, without diagnosis of hypertension: Secondary | ICD-10-CM | POA: Diagnosis not present

## 2016-04-18 DIAGNOSIS — M7022 Olecranon bursitis, left elbow: Secondary | ICD-10-CM | POA: Diagnosis not present

## 2016-08-17 DIAGNOSIS — M7022 Olecranon bursitis, left elbow: Secondary | ICD-10-CM | POA: Diagnosis not present

## 2016-08-17 DIAGNOSIS — R030 Elevated blood-pressure reading, without diagnosis of hypertension: Secondary | ICD-10-CM | POA: Diagnosis not present

## 2016-11-14 ENCOUNTER — Encounter: Payer: Self-pay | Admitting: Family Medicine

## 2016-11-14 ENCOUNTER — Ambulatory Visit (INDEPENDENT_AMBULATORY_CARE_PROVIDER_SITE_OTHER): Payer: BLUE CROSS/BLUE SHIELD | Admitting: Family Medicine

## 2016-11-14 VITALS — BP 152/100 | HR 79 | Temp 98.2°F | Ht 74.0 in | Wt 238.5 lb

## 2016-11-14 DIAGNOSIS — L723 Sebaceous cyst: Secondary | ICD-10-CM

## 2016-11-14 DIAGNOSIS — I10 Essential (primary) hypertension: Secondary | ICD-10-CM | POA: Diagnosis not present

## 2016-11-14 DIAGNOSIS — E039 Hypothyroidism, unspecified: Secondary | ICD-10-CM

## 2016-11-14 LAB — COMPREHENSIVE METABOLIC PANEL
ALK PHOS: 47 U/L (ref 39–117)
ALT: 23 U/L (ref 0–53)
AST: 28 U/L (ref 0–37)
Albumin: 4.4 g/dL (ref 3.5–5.2)
BILIRUBIN TOTAL: 0.5 mg/dL (ref 0.2–1.2)
BUN: 16 mg/dL (ref 6–23)
CO2: 28 mEq/L (ref 19–32)
CREATININE: 1.04 mg/dL (ref 0.40–1.50)
Calcium: 9.6 mg/dL (ref 8.4–10.5)
Chloride: 105 mEq/L (ref 96–112)
GFR: 100.23 mL/min (ref >60.00)
Glucose, Bld: 85 mg/dL (ref 70–99)
POTASSIUM: 3.8 meq/L (ref 3.5–5.1)
SODIUM: 140 meq/L (ref 135–145)
TOTAL PROTEIN: 7.3 g/dL (ref 6.0–8.3)

## 2016-11-14 LAB — T4, FREE: FREE T4: 0.66 ng/dL (ref 0.60–1.60)

## 2016-11-14 LAB — TSH: TSH: 10.34 u[IU]/mL — AB (ref 0.35–4.50)

## 2016-11-14 MED ORDER — HYDROCHLOROTHIAZIDE 25 MG PO TABS: 25.0000 mg | ORAL_TABLET | Freq: Every day | ORAL | 2 refills | Status: DC

## 2016-11-14 MED FILL — HYDROCHLOROTHIAZIDE 25 MG T: 25 | 30 days supply | Qty: 30 | Fill #0

## 2016-11-14 NOTE — Patient Instructions (Addendum)
DASH Eating Plan DASH stands for "Dietary Approaches to Stop Hypertension." The DASH eating plan is a healthy eating plan that has been shown to reduce high blood pressure (hypertension). It may also reduce your risk for type 2 diabetes, heart disease, and stroke. The DASH eating plan may also help with weight loss. What are tips for following this plan? General guidelines  Avoid eating more than 2,300 mg (milligrams) of salt (sodium) a day. If you have hypertension, you may need to reduce your sodium intake to 1,500 mg a day.  Limit alcohol intake to no more than 1 drink a day for nonpregnant women and 2 drinks a day for men. One drink equals 12 oz of beer, 5 oz of wine, or 1 oz of hard liquor.  Work with your health care provider to maintain a healthy body weight or to lose weight. Ask what an ideal weight is for you.  Get at least 30 minutes of exercise that causes your heart to beat faster (aerobic exercise) most days of the week. Activities may include walking, swimming, or biking.  Work with your health care provider or diet and nutrition specialist (dietitian) to adjust your eating plan to your individual calorie needs. Reading food labels  Check food labels for the amount of sodium per serving. Choose foods with less than 5 percent of the Daily Value of sodium. Generally, foods with less than 300 mg of sodium per serving fit into this eating plan.  To find whole grains, look for the word "whole" as the first word in the ingredient list. Shopping  Buy products labeled as "low-sodium" or "no salt added."  Buy fresh foods. Avoid canned foods and premade or frozen meals. Cooking  Avoid adding salt when cooking. Use salt-free seasonings or herbs instead of table salt or sea salt. Check with your health care provider or pharmacist before using salt substitutes.  Do not fry foods. Cook foods using healthy methods such as baking, boiling, grilling, and broiling instead.  Cook with  heart-healthy oils, such as olive, canola, soybean, or sunflower oil. Meal planning   Eat a balanced diet that includes: ? 5 or more servings of fruits and vegetables each day. At each meal, try to fill half of your plate with fruits and vegetables. ? Up to 6-8 servings of whole grains each day. ? Less than 6 oz of lean meat, poultry, or fish each day. A 3-oz serving of meat is about the same size as a deck of cards. One egg equals 1 oz. ? 2 servings of low-fat dairy each day. ? A serving of nuts, seeds, or beans 5 times each week. ? Heart-healthy fats. Healthy fats called Omega-3 fatty acids are found in foods such as flaxseeds and coldwater fish, like sardines, salmon, and mackerel.  Limit how much you eat of the following: ? Canned or prepackaged foods. ? Food that is high in trans fat, such as fried foods. ? Food that is high in saturated fat, such as fatty meat. ? Sweets, desserts, sugary drinks, and other foods with added sugar. ? Full-fat dairy products.  Do not salt foods before eating.  Try to eat at least 2 vegetarian meals each week.  Eat more home-cooked food and less restaurant, buffet, and fast food.  When eating at a restaurant, ask that your food be prepared with less salt or no salt, if possible. What foods are recommended? The items listed may not be a complete list. Talk with your dietitian about what   dietary choices are best for you. Grains Whole-grain or whole-wheat bread. Whole-grain or whole-wheat pasta. Brown rice. Oatmeal. Quinoa. Bulgur. Whole-grain and low-sodium cereals. Pita bread. Low-fat, low-sodium crackers. Whole-wheat flour tortillas. Vegetables Fresh or frozen vegetables (raw, steamed, roasted, or grilled). Low-sodium or reduced-sodium tomato and vegetable juice. Low-sodium or reduced-sodium tomato sauce and tomato paste. Low-sodium or reduced-sodium canned vegetables. Fruits All fresh, dried, or frozen fruit. Canned fruit in natural juice (without  added sugar). Meat and other protein foods Skinless chicken or turkey. Ground chicken or turkey. Pork with fat trimmed off. Fish and seafood. Egg whites. Dried beans, peas, or lentils. Unsalted nuts, nut butters, and seeds. Unsalted canned beans. Lean cuts of beef with fat trimmed off. Low-sodium, lean deli meat. Dairy Low-fat (1%) or fat-free (skim) milk. Fat-free, low-fat, or reduced-fat cheeses. Nonfat, low-sodium ricotta or cottage cheese. Low-fat or nonfat yogurt. Low-fat, low-sodium cheese. Fats and oils Soft margarine without trans fats. Vegetable oil. Low-fat, reduced-fat, or light mayonnaise and salad dressings (reduced-sodium). Canola, safflower, olive, soybean, and sunflower oils. Avocado. Seasoning and other foods Herbs. Spices. Seasoning mixes without salt. Unsalted popcorn and pretzels. Fat-free sweets. What foods are not recommended? The items listed may not be a complete list. Talk with your dietitian about what dietary choices are best for you. Grains Baked goods made with fat, such as croissants, muffins, or some breads. Dry pasta or rice meal packs. Vegetables Creamed or fried vegetables. Vegetables in a cheese sauce. Regular canned vegetables (not low-sodium or reduced-sodium). Regular canned tomato sauce and paste (not low-sodium or reduced-sodium). Regular tomato and vegetable juice (not low-sodium or reduced-sodium). Pickles. Olives. Fruits Canned fruit in a light or heavy syrup. Fried fruit. Fruit in cream or butter sauce. Meat and other protein foods Fatty cuts of meat. Ribs. Fried meat. Bacon. Sausage. Bologna and other processed lunch meats. Salami. Fatback. Hotdogs. Bratwurst. Salted nuts and seeds. Canned beans with added salt. Canned or smoked fish. Whole eggs or egg yolks. Chicken or turkey with skin. Dairy Whole or 2% milk, cream, and half-and-half. Whole or full-fat cream cheese. Whole-fat or sweetened yogurt. Full-fat cheese. Nondairy creamers. Whipped toppings.  Processed cheese and cheese spreads. Fats and oils Butter. Stick margarine. Lard. Shortening. Ghee. Bacon fat. Tropical oils, such as coconut, palm kernel, or palm oil. Seasoning and other foods Salted popcorn and pretzels. Onion salt, garlic salt, seasoned salt, table salt, and sea salt. Worcestershire sauce. Tartar sauce. Barbecue sauce. Teriyaki sauce. Soy sauce, including reduced-sodium. Steak sauce. Canned and packaged gravies. Fish sauce. Oyster sauce. Cocktail sauce. Horseradish that you find on the shelf. Ketchup. Mustard. Meat flavorings and tenderizers. Bouillon cubes. Hot sauce and Tabasco sauce. Premade or packaged marinades. Premade or packaged taco seasonings. Relishes. Regular salad dressings. Where to find more information:  National Heart, Lung, and Blood Institute: www.nhlbi.nih.gov  American Heart Association: www.heart.org Summary  The DASH eating plan is a healthy eating plan that has been shown to reduce high blood pressure (hypertension). It may also reduce your risk for type 2 diabetes, heart disease, and stroke.  With the DASH eating plan, you should limit salt (sodium) intake to 2,300 mg a day. If you have hypertension, you may need to reduce your sodium intake to 1,500 mg a day.  When on the DASH eating plan, aim to eat more fresh fruits and vegetables, whole grains, lean proteins, low-fat dairy, and heart-healthy fats.  Work with your health care provider or diet and nutrition specialist (dietitian) to adjust your eating plan to your individual   calorie needs. This information is not intended to replace advice given to you by your health care provider. Make sure you discuss any questions you have with your health care provider. Document Released: 12/30/2010 Document Revised: 01/04/2016 Document Reviewed: 01/04/2016 Elsevier Interactive Patient Education  2017 Elsevier Inc.  

## 2016-11-14 NOTE — Progress Notes (Signed)
Chief Complaint  Patient presents with  . Establish Care    hypertension       New Patient Visit SUBJECTIVE: HPI: Tony Walters is an 41 y.o.male who is being seen for establishing care.  The patient was previously seen here but has not been seen in >3 years.   Hypertension Patient presents for hypertension follow up. He does not monitor home blood pressures. He is not currently on any medications. Used to be on Coreg 6.25 mg BID and HCTZ 25 mg qd.  Patient had these side effects of medication: none He is not always adhering to a healthy diet overall. Exercise: Physically active at work  +Famhx of HTN. No CP or SOB.  Bump on back there for years, bothering him. Itching, no pain, drainage, redness or fevers. Would like it removed if possible.  Hx of hypothyroid, unsure why he is low. Does not remember the dose he was on. He has not been on medication for >3 years, nor has he had TSH/T4 levels checked.   No Known Allergies  Past Medical History:  Diagnosis Date  . Chicken pox as a child  . Elevated BP 04/04/2013  . Grief reaction 04/04/2013  . HTN (hypertension) 04/04/2013  . PTSD (post-traumatic stress disorder)   . Unspecified hypothyroidism 04/07/2013   Past Surgical History:  Procedure Laterality Date  . HYDROCELE EXCISION / REPAIR  41 years old  . WISDOM TOOTH EXTRACTION  2010   Social History   Social History  . Marital status: Single   Social History Main Topics  . Smoking status: Current Every Day Smoker    Packs/day: 0.50    Years: 0.80    Types: Cigarettes  . Smokeless tobacco: Never Used     Comment: quit in Nov 2014 but started back 03-18-13  . Alcohol use Yes     Comment: occasionally  . Drug use: No  . Sexual activity: Not Currently    Partners: Female   Family History  Problem Relation Age of Onset  . Congestive Heart Failure Father   . Hyperlipidemia Father   . Hypertension Father   . Kidney disease Maternal Grandmother   . Diabetes Maternal  Grandmother        type 2  . Stroke Maternal Grandmother   . Alcohol abuse Paternal Grandmother   . Pneumonia Paternal Grandfather   . Asthma Brother    Has not been taking any meds routinely.  ROS Cardiovascular: Denies chest pain  Respiratory: Denies dyspnea   OBJECTIVE: BP (!) 152/100 (BP Location: Left Arm, Patient Position: Sitting, Cuff Size: Large)   Pulse 79   Temp 98.2 F (36.8 C) (Oral)   Ht 6\' 2"  (1.88 m)   Wt 238 lb 8 oz (108.2 kg)   SpO2 97%   BMI 30.62 kg/m   Constitutional: -  VS reviewed -  Well developed, well nourished, appears stated age -  No apparent distress  Psychiatric: -  Oriented to person, place, and time -  Memory intact -  Affect and mood normal -  Fluent conversation, good eye contact -  Judgment and insight age appropriate  Eye: -  Conjunctivae clear, no discharge -  Pupils symmetric, round, reactive to light  ENMT: -  MMM    Pharynx moist, no exudate, no erythema  Neck: -  No gross swelling, no palpable masses -  Thyroid midline, not enlarged, mobile, no palpable masses  Cardiovascular: -  RRR -  No LE edema  Respiratory: -  Normal respiratory effort, no accessory muscle use, no retraction -  Breath sounds equal, no wheezes, no ronchi, no crackles  Musculoskeletal: -  No clubbing, no cyanosis -  Gait normal  Skin: -  1.8 cm in diameter seb cyst on back, centrally at approx T6 -  Warm and dry to palpation   ASSESSMENT/PLAN: Essential hypertension - Plan: Comprehensive metabolic panel, Basic metabolic panel, DISCONTINUED: hydrochlorothiazide (HYDRODIURIL) 25 MG tablet  Acquired hypothyroidism - Plan: TSH, T4, free  Sebaceous cyst  Restart HCTZ as he did well on it in the past, check renal function, lytes, thyroid. Start thyroid meds once labs return if he still needs it. Counseled on diet and exercise, DASH plan given. For cyst, contact insurance and see if they will cover removal, I can do it or refer to gen surg.  Patient should  return 1 week for labs, 4 weeks with me.  The patient voiced understanding and agreement to the plan.   [place], DO 11/14/16  2:58 PM

## 2016-11-14 NOTE — Progress Notes (Signed)
Pre visit review using our clinic review tool, if applicable. No additional management support is needed unless otherwise documented below in the visit note. 

## 2016-11-15 ENCOUNTER — Other Ambulatory Visit: Payer: Self-pay | Admitting: Family Medicine

## 2016-11-15 MED ORDER — LEVOTHYROXINE SODIUM 25 MCG PO CAPS: 25.0000 ug | ORAL_CAPSULE | Freq: Every day | ORAL | 2 refills | Status: DC

## 2016-11-15 NOTE — Progress Notes (Signed)
Thyroid med called in, we are to notify him. TY.

## 2016-11-18 DIAGNOSIS — S39012A Strain of muscle, fascia and tendon of lower back, initial encounter: Secondary | ICD-10-CM | POA: Diagnosis not present

## 2016-11-22 ENCOUNTER — Other Ambulatory Visit (INDEPENDENT_AMBULATORY_CARE_PROVIDER_SITE_OTHER): Payer: BLUE CROSS/BLUE SHIELD

## 2016-11-22 ENCOUNTER — Telehealth: Payer: Self-pay | Admitting: Family Medicine

## 2016-11-22 DIAGNOSIS — I10 Essential (primary) hypertension: Secondary | ICD-10-CM | POA: Diagnosis not present

## 2016-11-22 LAB — BASIC METABOLIC PANEL
BUN: 21 mg/dL (ref 6–23)
CO2: 29 meq/L (ref 19–32)
CREATININE: 1.11 mg/dL (ref 0.40–1.50)
Calcium: 9.4 mg/dL (ref 8.4–10.5)
Chloride: 105 mEq/L (ref 96–112)
GFR: 92.96 mL/min (ref >60.00)
Glucose, Bld: 112 mg/dL — ABNORMAL HIGH (ref 70–99)
POTASSIUM: 3.7 meq/L (ref 3.5–5.1)
Sodium: 141 mEq/L (ref 135–145)

## 2016-11-22 NOTE — Telephone Encounter (Signed)
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 Please have letter ready since pt is needing it for Thursday 11-24-2016. Please call pt when letter ready and please faxed letter to [facility] fax number [phone]. Please advise.

## 2016-11-22 NOTE — Telephone Encounter (Signed)
OK to write letter stating: "Patient is working with our office to optimize his health. At this time, it is my medical opinion that he be allowed to return to work without restrictions." TY.

## 2016-11-23 ENCOUNTER — Encounter: Payer: Self-pay | Admitting: Family Medicine

## 2016-11-23 NOTE — Telephone Encounter (Signed)
Letter completed///patient notified to pickup at the front desk. 

## 2016-12-13 ENCOUNTER — Ambulatory Visit: Payer: BLUE CROSS/BLUE SHIELD | Admitting: Family Medicine

## 2016-12-13 VITALS — BP 137/89 | HR 97

## 2016-12-13 DIAGNOSIS — I10 Essential (primary) hypertension: Secondary | ICD-10-CM

## 2016-12-13 NOTE — Progress Notes (Signed)
Pre visit review using our clinic tool,if applicable. No additional management support is needed unless otherwise documented below in the visit note.   Patient in for BP check per order from Dr. Frederik Pear dated 11/14/16.  Patient said this am he bent over and experienced some dizziness. No other complaints voiced.  BP  today = 137/89 P= 97  Per Dr, Charlett Blake patient to continue taking BP medication as ordered. Stay hydrated  With at least 64 oz of fluids/water daily and return for follow up visit with provider in 3 months. Patient agreed and appointment schedule  Nurse Blood Pressure check note reviewed. Agree with documention and plan.

## 2016-12-13 NOTE — Progress Notes (Signed)
RN blood check note reviewed. Agree with documention and plan. 

## 2016-12-18 NOTE — Progress Notes (Signed)
Nurse Blood Pressure check note reviewed. Agree with documention and plan.

## 2016-12-22 ENCOUNTER — Encounter (HOSPITAL_BASED_OUTPATIENT_CLINIC_OR_DEPARTMENT_OTHER): Payer: Self-pay | Admitting: *Deleted

## 2016-12-22 ENCOUNTER — Emergency Department (HOSPITAL_BASED_OUTPATIENT_CLINIC_OR_DEPARTMENT_OTHER): Payer: BLUE CROSS/BLUE SHIELD

## 2016-12-22 ENCOUNTER — Other Ambulatory Visit: Payer: Self-pay

## 2016-12-22 ENCOUNTER — Emergency Department (HOSPITAL_BASED_OUTPATIENT_CLINIC_OR_DEPARTMENT_OTHER)
Admission: EM | Admit: 2016-12-22 | Discharge: 2016-12-22 | Disposition: A | Discharge status: Home / Self Care | Payer: BLUE CROSS/BLUE SHIELD | Attending: Emergency Medicine | Admitting: Emergency Medicine

## 2016-12-22 DIAGNOSIS — E039 Hypothyroidism, unspecified: Secondary | ICD-10-CM | POA: Insufficient documentation

## 2016-12-22 DIAGNOSIS — F17210 Nicotine dependence, cigarettes, uncomplicated: Secondary | ICD-10-CM | POA: Insufficient documentation

## 2016-12-22 DIAGNOSIS — Z79899 Other long term (current) drug therapy: Secondary | ICD-10-CM | POA: Diagnosis not present

## 2016-12-22 DIAGNOSIS — M67471 Ganglion, right ankle and foot: Secondary | ICD-10-CM | POA: Insufficient documentation

## 2016-12-22 DIAGNOSIS — M79672 Pain in left foot: Secondary | ICD-10-CM | POA: Diagnosis not present

## 2016-12-22 DIAGNOSIS — I10 Essential (primary) hypertension: Secondary | ICD-10-CM | POA: Diagnosis not present

## 2016-12-22 DIAGNOSIS — M67479 Ganglion, unspecified ankle and foot: Secondary | ICD-10-CM

## 2016-12-22 DIAGNOSIS — M67472 Ganglion, left ankle and foot: Secondary | ICD-10-CM | POA: Diagnosis not present

## 2016-12-22 NOTE — ED Triage Notes (Signed)
Pain in his left foot for a week. States he has a knot on the arch of his foot. No injury.

## 2016-12-22 NOTE — Discharge Instructions (Signed)
Please read instructions below. You can apply ice for 20 minutes at a time. Take ibuprofen every 6 hours as needed for pain. Follow up with the foot specialist, Podiatrist, for treatment options of the cysts in your feet. Return to the ER for new or concerning symptoms.

## 2016-12-22 NOTE — ED Provider Notes (Signed)
[place] EMERGENCY DEPARTMENT Provider Note   CSN: [id number] Arrival date & time: 12/22/16  2134     History   Chief Complaint Chief Complaint  Patient presents with  . Foot Pain    HP  Tony Walters is a 41 y.o. male with past medical history of hypertension, presenting to the ED with 1.5 weeks of left foot pain located in the arch.  He denies injury, states he noticed a painful bumps in the arch of his left foot that is worse with ambulation.  He reports his right arch started hurting recently as well.  No injuries. No medications tried.  No fever.  No other complaints.  The history is provided by the patient.    Past Medical History:  Diagnosis Date  . Chicken pox as a child  . Elevated BP 04/04/2013  . Grief reaction 04/04/2013  . HTN (hypertension) 04/04/2013  . PTSD (post-traumatic stress disorder)   . Unspecified hypothyroidism 04/07/2013    Patient Active Problem List   Diagnosis Date Noted  . Depression with anxiety 05/09/2013  . Obesity, unspecified 04/07/2013  . Insomnia 04/07/2013  . Hypothyroidism 04/07/2013  . HTN (hypertension) 04/04/2013  . Grief reaction 04/04/2013    Past Surgical History:  Procedure Laterality Date  . HYDROCELE EXCISION / REPAIR  41 years old  . WISDOM TOOTH EXTRACTION  2010       Home Medications    Prior to Admission medications   Medication Sig Start Date End Date Taking? Authorizing Provider  hydrochlorothiazide (HYDRODIURIL) 25 MG tablet Take 1 tablet (25 mg total) by mouth daily. 11/14/16  Yes Shelda Pal, DO  Levothyroxine Sodium 25 MCG CAPS Take 1 capsule (25 mcg total) by mouth daily before breakfast. 11/15/16  Yes Wendling, Crosby Oyster, DO    Family History Family History  Problem Relation Age of Onset  . Congestive Heart Failure Father   . Hyperlipidemia Father   . Hypertension Father   . Kidney disease Maternal Grandmother   . Diabetes Maternal Grandmother        type 2  .  Stroke Maternal Grandmother   . Alcohol abuse Paternal Grandmother   . Pneumonia Paternal Grandfather   . Asthma Brother     Social History Social History   Tobacco Use  . Smoking status: Current Every Day Smoker    Packs/day: 0.50    Years: 25.00    Pack years: 12.50    Types: Cigarettes  . Smokeless tobacco: Never Used  . Tobacco comment: quit in Nov 2014 but started back 03-18-13  Substance Use Topics  . Alcohol use: Yes    Comment: occasionally  . Drug use: No     Allergies   Patient has no known allergies.   Review of Systems Review of Systems  Constitutional: Negative for fever.  Musculoskeletal: Positive for myalgias.  Skin: Negative for color change.     Physical Exam Updated Vital Signs BP (!) 142/87   Pulse 77   Temp 98 F (36.7 C) (Oral)   Resp 20   Ht 6\' 2"  (1.88 m)   Wt 108 kg (238 lb)   SpO2 97%   BMI 30.56 kg/m   Physical Exam  Constitutional: He appears well-developed and well-nourished. No distress.  HENT:  Head: Normocephalic and atraumatic.  Eyes: Conjunctivae are normal.  Cardiovascular: Normal rate and intact distal pulses.  Pulmonary/Chest: Effort normal.  Musculoskeletal:       Feet:  Plantar aspect of left foot  in arch with tender cluster of 3 pea-sized rubbery mobile masses Similar feeling singular mass to right arch in similar location, pea-sized. No erythema or warmth. Foot with normal ROM. Intact distal pulses. Normal sensation.  Psychiatric: He has a normal mood and affect. His behavior is normal.  Nursing note and vitals reviewed.    ED Treatments / Results  Labs (all labs ordered are listed, but only abnormal results are displayed) Labs Reviewed - No data to display  EKG  EKG Interpretation None       Radiology Dg Foot Complete Left  Result Date: 12/22/2016 CLINICAL DATA:  Painful forefoot plantar mass for 1 week. EXAM: LEFT FOOT - COMPLETE 3+ VIEW COMPARISON:  None. FINDINGS: No acute fracture deformity  or dislocation. Fifth distal phalanx geographic lucency suggestive of epidermal inclusion cyst if prior history of injury. Moderate first metatarsal phalangeal joint space narrowing with periarticular sclerosis and marginal spurring. Soft tissue planes are not suspicious. IMPRESSION: Moderate first MTP osteoarthrosis without acute osseous process. Please note, MRI is more sensitive for assessment of soft tissue mass. Probable fifth distal phalanx epidural inclusion cyst. Electronically Signed   By: Elon Alas M.D.   On: 12/22/2016 22:33    Procedures Procedures (including critical care time)  Medications Ordered in ED Medications - No data to display   Initial Impression / Assessment and Plan / ED Course  I have reviewed the triage vital signs and the nursing notes.  Pertinent labs & imaging results that were available during my care of the patient were reviewed by me and considered in my medical decision making (see chart for details).    Pt with tender masses in arch of bilateral feet, consistent with ganglion cysts. Exam not concerning for abscess, no erythema or warmth. Xray neg. Recommend symptomatic management and follow up with podiatry. Safe for discharge.  Patient discussed with Dr. Ellender Hose.  Discussed results, findings, treatment and follow up. Patient advised of return precautions. Patient verbalized understanding and agreed with plan.  Final Clinical Impressions(s) / ED Diagnoses   Final diagnoses:  Ganglion cyst of foot    ED Discharge Orders    None       Robinson, [place] N, PA-C 12/22/16 2251    Duffy Bruce, MD 12/23/16 [phone]

## 2016-12-29 ENCOUNTER — Ambulatory Visit: Payer: Self-pay

## 2016-12-29 ENCOUNTER — Encounter: Payer: Self-pay | Admitting: Podiatry

## 2016-12-29 MED FILL — HYDROCHLOROTHIAZIDE 25 MG T: 25 | 30 days supply | Qty: 30 | Fill #1

## 2017-01-05 ENCOUNTER — Encounter: Payer: Self-pay | Admitting: Podiatry

## 2017-01-05 ENCOUNTER — Ambulatory Visit (INDEPENDENT_AMBULATORY_CARE_PROVIDER_SITE_OTHER): Payer: BLUE CROSS/BLUE SHIELD | Admitting: Podiatry

## 2017-01-05 DIAGNOSIS — L819 Disorder of pigmentation, unspecified: Secondary | ICD-10-CM | POA: Diagnosis not present

## 2017-01-05 DIAGNOSIS — M722 Plantar fascial fibromatosis: Secondary | ICD-10-CM | POA: Diagnosis not present

## 2017-01-05 DIAGNOSIS — L603 Nail dystrophy: Secondary | ICD-10-CM

## 2017-01-05 DIAGNOSIS — L608 Other nail disorders: Secondary | ICD-10-CM | POA: Diagnosis not present

## 2017-01-05 NOTE — Progress Notes (Signed)
  Subjective:  Patient ID: Tony Walters, male    DOB: 06-Nov-1975,  MRN: [id number] HPI Chief Complaint  Patient presents with  . Foot Pain    Plantar (arch) left - knot in arch x 2-3 weeks, painful walking and throbs at rest, PA at [facility] xrayed and offered Ibuprofen but declined  . Nail Problem    5th toenail left - nail thick and dark, any recommendations?    41 y.o. male presents with the above complaint.     Past Medical History:  Diagnosis Date  . Chicken pox as a child  . Elevated BP 04/04/2013  . Grief reaction 04/04/2013  . HTN (hypertension) 04/04/2013  . PTSD (post-traumatic stress disorder)   . Unspecified hypothyroidism 04/07/2013   Past Surgical History:  Procedure Laterality Date  . HYDROCELE EXCISION / REPAIR  41 years old  . WISDOM TOOTH EXTRACTION  2010    Current Outpatient Medications:  .  hydrochlorothiazide (HYDRODIURIL) 25 MG tablet, Take 1 tablet (25 mg total) by mouth daily., Disp: 30 tablet, Rfl: 2 .  Levothyroxine Sodium 25 MCG CAPS, Take 1 capsule (25 mcg total) by mouth daily before breakfast., Disp: 30 capsule, Rfl: 2  No Known Allergies Review of Systems  All other systems reviewed and are negative.  Objective:  There were no vitals filed for this visit.  General: Well developed, nourished, in no acute distress, alert and oriented x3   Dermatological: Skin is warm, dry and supple bilateral. Nails x 10 are well maintained; remaining integument appears unremarkable at this time. There are no open sores, no preulcerative lesions, no rash or signs of infection present.  Toenails of the left foot appear to be thick yellow dystrophic with mycotic particular the hallux left.  He also has severe deformity and thickening with discoloration fifth digit left foot.  Vascular: Dorsalis Pedis artery and Posterior Tibial artery pedal pulses are 2/4 bilateral with immedate capillary fill time. Pedal hair growth present. No varicosities and no lower extremity edema  present bilateral.   Neruologic: Grossly intact via light touch bilateral. Vibratory intact via tuning fork bilateral. Protective threshold with Semmes Wienstein monofilament intact to all pedal sites bilateral. Patellar and Achilles deep tendon reflexes 2+ bilateral. No Babinski or clonus noted bilateral.   Musculoskeletal: No gross boney pedal deformities bilateral. No pain, crepitus, or limitation noted with foot and ankle range of motion bilateral. Muscular strength 5/5 in all groups tested bilateral.  Firm plantar fibromas to the medial band of the plantar fascia of the left foot greater than that of the right measuring less than 2 cm in total diameter.  Gait: Unassisted, Nonantalgic.    Radiographs:  Radiographs taken previously demonstrate a osteoarthritic change of the first metatarsophalangeal joint however he does have soft tissue mass to the medial band of the plantar fascia of the left foot.  No signs of calcification.  Assessment & Plan:   Assessment: Plantar fibroma and nail dystrophy.    Plan: Injected the plantar fibroma today after sterile Betadine skin prep total 20 mg of Kenalog and 5 mg of Marcaine were injected after verbal consent.  There injected to the point of maximal tenderness he tolerated procedure well and will follow up with me in 6 weeks.  Samples of the toenails were taken today to be sent for pathologic evaluation.     Tony Walters. Tony Walters, [place]

## 2017-01-13 ENCOUNTER — Telehealth: Payer: Self-pay | Admitting: *Deleted

## 2017-01-13 NOTE — Telephone Encounter (Signed)
-----   Message from Garrel Ridgel, [place] sent at 01/12/2017  4:51 PM EST ----- Negative for fungus and is due to micro trauma of the nail.

## 2017-01-13 NOTE — Telephone Encounter (Signed)
I informed pt of Dr. Hyatt's review of results. 

## 2017-01-19 NOTE — Progress Notes (Signed)
This encounter was created in error - please disregard.

## 2017-02-16 ENCOUNTER — Ambulatory Visit (INDEPENDENT_AMBULATORY_CARE_PROVIDER_SITE_OTHER): Payer: BLUE CROSS/BLUE SHIELD | Admitting: Podiatry

## 2017-02-16 ENCOUNTER — Encounter: Payer: Self-pay | Admitting: Podiatry

## 2017-02-16 DIAGNOSIS — M722 Plantar fascial fibromatosis: Secondary | ICD-10-CM

## 2017-02-16 MED ORDER — DICLOFENAC SODIUM 1 % TD GEL: 4.0000 g | Freq: Four times a day (QID) | TRANSDERMAL | 2 refills | Status: DC

## 2017-02-16 NOTE — Progress Notes (Signed)
He presents today for follow-up of his plantar fibromatosis to the plantar aspect of his left foot.  He states that is still tender but not as bad as it was.  Objective: Vital signs are stable he is alert and oriented x3 still has an retains nonpulsatile palpable masses measuring 1 cm x 2 cm medial longitudinal arch left foot.  Mildly tender on palpation.  No open lesions or wounds are seen.  Assessment: Plantar fibromatosis.  Plan: Had a long discussion with him about options for this including further injection therapy complete excision he understands this is amenable to it at this point time he would like to hold off on doing anything and would like to give it at least another 6 weeks to see physical unchanged.  I will follow-up with him at that time if necessary.

## 2017-02-24 ENCOUNTER — Telehealth: Payer: Self-pay | Admitting: *Deleted

## 2017-02-24 NOTE — Telephone Encounter (Signed)
Pt states he now has plantar fibroma in the other foot and would like to be seen earlier. I transferred pt to schedulers.

## 2017-02-24 NOTE — Telephone Encounter (Signed)
Left message for pt to call the appt line [phone] or [phone] to get an earlier appt, and if needed a nurse call me [phone].

## 2017-02-24 NOTE — Telephone Encounter (Signed)
Pt states he needs to reschedule his 04/04/2017 appt to an earlier date.

## 2017-02-26 ENCOUNTER — Other Ambulatory Visit: Payer: Self-pay | Admitting: Family Medicine

## 2017-03-07 ENCOUNTER — Ambulatory Visit: Payer: BLUE CROSS/BLUE SHIELD | Admitting: Podiatry

## 2017-03-10 ENCOUNTER — Telehealth: Payer: Self-pay | Admitting: *Deleted

## 2017-03-10 NOTE — Telephone Encounter (Signed)
Faxed Appeal Filing Form to [employer] to [employer].

## 2017-03-14 ENCOUNTER — Encounter: Payer: Self-pay | Admitting: Podiatry

## 2017-03-14 ENCOUNTER — Ambulatory Visit (INDEPENDENT_AMBULATORY_CARE_PROVIDER_SITE_OTHER): Payer: BLUE CROSS/BLUE SHIELD | Admitting: Podiatry

## 2017-03-14 DIAGNOSIS — M722 Plantar fascial fibromatosis: Secondary | ICD-10-CM

## 2017-03-14 MED ORDER — MELOXICAM 15 MG PO TABS: 15.0000 mg | ORAL_TABLET | Freq: Every day | ORAL | 3 refills | Status: DC

## 2017-03-14 MED ORDER — METHYLPREDNISOLONE 4 MG PO TBPK: ORAL_TABLET | ORAL | 0 refills | Status: DC

## 2017-03-14 MED ORDER — TRAMADOL HCL 50 MG PO TABS: 50.0000 mg | ORAL_TABLET | Freq: Three times a day (TID) | ORAL | 0 refills | Status: DC | PRN

## 2017-03-14 NOTE — Patient Instructions (Signed)
Pre-Operative Instructions  Congratulations, you have decided to take an important step towards improving your quality of life.  You can be assured that the doctors and staff at [facility] will be with you every step of the way.  Here are some important things you should know:  1. Plan to be at the surgery center/hospital at least 1 (one) hour prior to your scheduled time, unless otherwise directed by the surgical center/hospital staff.  You must have a responsible adult accompany you, remain during the surgery and drive you home.  Make sure you have directions to the surgical center/hospital to ensure you arrive on time. 2. If you are having surgery at [facility] or [facility] hospitals, you will need a copy of your medical history and physical form from your family physician within one month prior to the date of surgery. We will give you a form for your primary physician to complete.  3. We make every effort to accommodate the date you request for surgery.  However, there are times where surgery dates or times have to be moved.  We will contact you as soon as possible if a change in schedule is required.   4. No aspirin/ibuprofen for one week before surgery.  If you are on aspirin, any non-steroidal anti-inflammatory medications (Mobic, Aleve, Ibuprofen) should not be taken seven (7) days prior to your surgery.  You make take Tylenol for pain prior to surgery.  5. Medications - If you are taking daily heart and blood pressure medications, seizure, reflux, allergy, asthma, anxiety, pain or diabetes medications, make sure you notify the surgery center/hospital before the day of surgery so they can tell you which medications you should take or avoid the day of surgery. 6. No food or drink after midnight the night before surgery unless directed otherwise by surgical center/hospital staff. 7. No alcoholic beverages 24-hours prior to surgery.  No smoking 24-hours prior or 24-hours after  surgery. 8. Wear loose pants or shorts. They should be loose enough to fit over bandages, boots, and casts. 9. Don't wear slip-on shoes. Sneakers are preferred. 10. Bring your boot with you to the surgery center/hospital.  Also bring crutches or a walker if your physician has prescribed it for you.  If you do not have this equipment, it will be provided for you after surgery. 11. If you have not been contacted by the surgery center/hospital by the day before your surgery, call to confirm the date and time of your surgery. 12. Leave-time from work may vary depending on the type of surgery you have.  Appropriate arrangements should be made prior to surgery with your employer. 13. Prescriptions will be provided immediately following surgery by your doctor.  Fill these as soon as possible after surgery and take the medication as directed. Pain medications will not be refilled on weekends and must be approved by the doctor. 14. Remove nail polish on the operative foot and avoid getting pedicures prior to surgery. 15. Wash the night before surgery.  The night before surgery wash the foot and leg well with water and the antibacterial soap provided. Be sure to pay special attention to beneath the toenails and in between the toes.  Wash for at least three (3) minutes. Rinse thoroughly with water and dry well with a towel.  Perform this wash unless told not to do so by your physician.  Enclosed: 1 Ice pack (please put in freezer the night before surgery)   1 Hibiclens skin cleaner     Pre-op instructions  If you have any questions regarding the instructions, please do not hesitate to call our office.  [place]: [address], [place], [place] [address] -- [phone]  [place]: [address] [place], [place] [address] -- [phone]  [place]: [address]  [place], [place] [address] -- [phone]  [place]: [address], [address], [place],  [address] -- [phone]  Website:  [email or web address] 

## 2017-03-15 ENCOUNTER — Telehealth: Payer: Self-pay | Admitting: *Deleted

## 2017-03-15 ENCOUNTER — Encounter: Payer: Self-pay | Admitting: Family Medicine

## 2017-03-15 ENCOUNTER — Ambulatory Visit: Payer: BLUE CROSS/BLUE SHIELD | Admitting: Family Medicine

## 2017-03-15 VITALS — BP 138/84 | HR 79 | Temp 98.1°F | Ht 74.0 in | Wt 245.4 lb

## 2017-03-15 DIAGNOSIS — R5383 Other fatigue: Secondary | ICD-10-CM

## 2017-03-15 DIAGNOSIS — I10 Essential (primary) hypertension: Secondary | ICD-10-CM | POA: Diagnosis not present

## 2017-03-15 DIAGNOSIS — M722 Plantar fascial fibromatosis: Secondary | ICD-10-CM

## 2017-03-15 MED ORDER — HYDROCHLOROTHIAZIDE 25 MG PO TABS: 25.0000 mg | ORAL_TABLET | Freq: Every day | ORAL | 6 refills | Status: DC

## 2017-03-15 MED FILL — METHYLPREDNISOLONE 4 MG TAB: 4 | 6 days supply | Qty: 21 | Fill #0

## 2017-03-15 MED FILL — HYDROCHLOROTHIAZIDE 25 MG T: 25 | 30 days supply | Qty: 30 | Fill #0

## 2017-03-15 MED FILL — traMADol HCL 50 MG TABS: 50 | 7 days supply | Qty: 20 | Fill #0

## 2017-03-15 MED FILL — MELOXICAM 15 MG TABLET: 15 | 30 days supply | Qty: 30 | Fill #0

## 2017-03-15 NOTE — Telephone Encounter (Signed)
Faxed orders to [facility]. 
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
## 2017-03-15 NOTE — Patient Instructions (Signed)
Try to keep the diet clean.  Aim to do some physical exertion for 150 minutes per week. This is typically divided into 5 days per week, 30 minutes per day. The activity should be enough to get your heart rate up. Anything is better than nothing if you have time constraints.  Let us know if you need anything.

## 2017-03-15 NOTE — Progress Notes (Signed)
Pre visit review using our clinic review tool, if applicable. No additional management support is needed unless otherwise documented below in the visit note. 

## 2017-03-15 NOTE — Progress Notes (Signed)
He presents today stating that his foot is killing him I am ready to have surgery to get rid of this thing is he refers to the plantar fibroma plantar aspect of the left foot.  He states that it is inhibiting from me from doing my daily activities and my regular job by 2 hours into my job is just absolutely killing me and is all I can think about.  He is a Geophysicist/field seismologist for [employer].  Objective: Vital signs are stable he is alert and oriented x3.  Pulses are palpable.  Plantar fibromas to the plantar aspect of the medial longitudinal arch span approximately 7 cm.  They are painful on palpation the medial most margin appears to be clean but I cannot see if the lateral margin is clean and the medial band of the plantar aponeurosis.  At this point I do not think an MRI will be most beneficial.  Assessment: Plantar fibroma left foot.  Plan: At this point were going to request an MRI of the left foot with contrast.  This should give [place] the depth and the exact measurements in size and location of the fibromas.  We consented him today for excision plantar fibroma plantar aspect of the left foot.  He understands this and is amenable to it.  We did discuss the possible postop complications which may include but are not limited to postop pain bleeding swelling infection recurrence need for further surgery overcorrection under correction failure of the wound to heal properly.  He understands that he is going to be nonweightbearing for at least 21 days and he states that he would use crutches for that.  He signed a 3 page of the consent form we will follow-up with him in the near future for surgical intervention I will let him know once his MRI is obtained.

## 2017-03-15 NOTE — Telephone Encounter (Signed)
-----   Message from Rip Harbour, [facility] sent at 03/14/2017  5:35 PM EST ----- Regarding: MRI  MRI with contrast left - evaluate plantar fibroma left foot - surgical consideration

## 2017-03-16 ENCOUNTER — Other Ambulatory Visit: Payer: Self-pay | Admitting: Family Medicine

## 2017-03-16 ENCOUNTER — Other Ambulatory Visit (INDEPENDENT_AMBULATORY_CARE_PROVIDER_SITE_OTHER): Payer: Self-pay

## 2017-03-16 DIAGNOSIS — E039 Hypothyroidism, unspecified: Secondary | ICD-10-CM

## 2017-03-16 LAB — CBC
HCT: 45.7 % (ref 39.0–52.0)
Hemoglobin: 15.3 g/dL (ref 13.0–17.0)
MCHC: 33.4 g/dL (ref 30.0–36.0)
MCV: 85.7 fl (ref 78.0–100.0)
Platelets: 263 10*3/uL (ref 150.0–400.0)
RBC: 5.33 Mil/uL (ref 4.22–5.81)
RDW: 14.5 % (ref 11.5–15.5)
WBC: 10.9 10*3/uL — AB (ref 4.0–10.5)

## 2017-03-16 LAB — BASIC METABOLIC PANEL
BUN: 16 mg/dL (ref 6–23)
CALCIUM: 9.7 mg/dL (ref 8.4–10.5)
CHLORIDE: 102 meq/L (ref 96–112)
CO2: 33 meq/L — AB (ref 19–32)
Creatinine, Ser: 1.45 mg/dL (ref 0.40–1.50)
GFR: 68.2 mL/min (ref >60.00)
GLUCOSE: 69 mg/dL — AB (ref 70–99)
Potassium: 4.5 mEq/L (ref 3.5–5.1)
SODIUM: 141 meq/L (ref 135–145)

## 2017-03-16 LAB — T4, FREE: Free T4: 0.59 ng/dL — ABNORMAL LOW (ref 0.60–1.60)

## 2017-03-16 LAB — TSH: TSH: 9.2 u[IU]/mL — ABNORMAL HIGH (ref 0.35–4.50)

## 2017-03-16 MED ORDER — LEVOTHYROXINE SODIUM 50 MCG PO TABS
50.0000 ug | ORAL_TABLET | Freq: Every day | ORAL | 3 refills | Status: DC
Start: 2017-03-16 — End: 2018-05-08

## 2017-03-16 MED FILL — LEVOTHYROXINE 50 MCG TABLET: 50 | 30 days supply | Qty: 30 | Fill #0

## 2017-03-17 ENCOUNTER — Telehealth: Payer: Self-pay | Admitting: Family Medicine

## 2017-03-17 NOTE — Telephone Encounter (Signed)
I called Tony Walters to reschedule his appt with Dr. Nani Ravens from 04/10/17 to a 6 week appt; should be the 1st week of April.  I mistakenly scheduled him for 4 weeks instead of 6 weeks.  I left a voicemail for him to call the office back and reschedule his appt.

## 2017-03-20 ENCOUNTER — Telehealth: Payer: Self-pay | Admitting: Family Medicine

## 2017-03-20 NOTE — Telephone Encounter (Signed)
Left message to call us back and reschedule his appt from 04/10/17 to a 6 wk appt instead of a 4 wk that is scheduled.

## 2017-03-23 ENCOUNTER — Ambulatory Visit (INDEPENDENT_AMBULATORY_CARE_PROVIDER_SITE_OTHER): Payer: BLUE CROSS/BLUE SHIELD | Admitting: Podiatry

## 2017-03-23 ENCOUNTER — Encounter: Payer: Self-pay | Admitting: Podiatry

## 2017-03-23 DIAGNOSIS — M722 Plantar fascial fibromatosis: Secondary | ICD-10-CM

## 2017-03-23 MED ORDER — METHYLPREDNISOLONE 4 MG PO TBPK: ORAL_TABLET | ORAL | 0 refills | Status: DC

## 2017-03-23 MED ORDER — INDOMETHACIN 50 MG PO CAPS: 50.0000 mg | ORAL_CAPSULE | Freq: Two times a day (BID) | ORAL | 1 refills | Status: DC

## 2017-03-23 NOTE — Progress Notes (Signed)
He presents today for follow-up of pain to the plantar and plantar forefoot of his left foot.  He states that while at work over the past few days his foot has become so painful he can hardly work on it.  He states that he is unable to take narcotic while at work and there is no light duty.  He states that he does get tingling and numbing sensations as well as throbbing sensations and he is unable to rest.  Objective: Vital signs are stable he is alert and oriented x3 he has pain on palpation plantar aspect of his left foot along the medial band of the plantar fascia and on palpation of the first metatarsophalangeal joint which does demonstrate mild hallux valgus deformity.  Assessment: Chronic pain associated with plantar fibromatosis and hallux valgus.  Plan: At this point his MRI is scheduled for March 4 I will follow-up with him once that is complete.  Until then he is to be out of work basically until we are complete with surgery.  Also prescribed him indomethacin as well as a steroid pack once again.  I should this alleviate his symptoms enough that he is able to work he could work with indomethacin.

## 2017-03-27 ENCOUNTER — Ambulatory Visit
Admission: RE | Admit: 2017-03-27 | Discharge: 2017-03-27 | Disposition: A | Discharge status: Home / Self Care | Payer: BLUE CROSS/BLUE SHIELD | Source: Ambulatory Visit | Attending: Podiatry | Admitting: Podiatry

## 2017-03-27 DIAGNOSIS — M722 Plantar fascial fibromatosis: Secondary | ICD-10-CM

## 2017-03-27 DIAGNOSIS — M19072 Primary osteoarthritis, left ankle and foot: Secondary | ICD-10-CM | POA: Diagnosis not present

## 2017-03-27 MED ORDER — GADOBENATE DIMEGLUMINE 529 MG/ML IV SOLN: 20.0000 mL | Freq: Once | INTRAVENOUS | Status: DC | PRN

## 2017-04-03 ENCOUNTER — Telehealth: Payer: Self-pay | Admitting: *Deleted

## 2017-04-03 DIAGNOSIS — M79676 Pain in unspecified toe(s): Secondary | ICD-10-CM

## 2017-04-03 NOTE — Telephone Encounter (Signed)
Entered in error

## 2017-04-04 ENCOUNTER — Ambulatory Visit: Payer: BLUE CROSS/BLUE SHIELD | Admitting: Podiatry

## 2017-04-10 ENCOUNTER — Ambulatory Visit: Payer: Self-pay | Admitting: Family Medicine

## 2017-04-11 ENCOUNTER — Telehealth: Payer: Self-pay | Admitting: *Deleted

## 2017-04-11 ENCOUNTER — Encounter: Payer: Self-pay | Admitting: Podiatry

## 2017-04-11 ENCOUNTER — Ambulatory Visit (INDEPENDENT_AMBULATORY_CARE_PROVIDER_SITE_OTHER): Payer: BLUE CROSS/BLUE SHIELD | Admitting: Podiatry

## 2017-04-11 DIAGNOSIS — M722 Plantar fascial fibromatosis: Secondary | ICD-10-CM

## 2017-04-11 DIAGNOSIS — M2012 Hallux valgus (acquired), left foot: Secondary | ICD-10-CM

## 2017-04-11 MED ORDER — TRAMADOL HCL 50 MG PO TABS: 50.0000 mg | ORAL_TABLET | Freq: Three times a day (TID) | ORAL | 0 refills | Status: DC | PRN

## 2017-04-11 MED ORDER — INDOMETHACIN 50 MG PO CAPS: 50.0000 mg | ORAL_CAPSULE | Freq: Two times a day (BID) | ORAL | 1 refills | Status: DC

## 2017-04-11 NOTE — Progress Notes (Signed)
Presents today for follow-up of his MRI.  He is already set up for excision plantar fibromas plantar aspect of the left foot and back today to consider him for correction of the first metatarsophalangeal joint left.  No changes past medical history.  He states that he did not fill his indomethacin and therefore is not taking it.  Objective: Vital signs are stable alert oriented x3.  Pulses are strongly palpable.  Neurologic sensorium is intact.  No change on palpation of the plantar fibromas.  He also has pain on palpation and range of motion of the first metatarsophalangeal joint of the left foot.  MRI does discussed in great detail changes first metatarsophalangeal joint left as well as plantar fibromas.  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  Plantar fibromas left foot.  Plan: Discussed etiology pathology conservative versus surgical therapies.  Today surgical intervention consisting of a possible or possible joint replacement and incision and plantar fibromas.  This is amenable to it again we discussed the possible postop outpatient which may include but are not limited to postop pain bleeding swelling infection recurrence need for further surgery.

## 2017-04-11 NOTE — Telephone Encounter (Signed)
"  I'm working on short term disability for [employer].  Is he scheduled for surgery on 04/21/2017 with Dr. Milinda Pointer?"  Yes, he is scheduled for surgery on March 29.  What is the procedure(s)?"  He's having a [id number] and [id number].  "Does he have any follow-up appointments scheduled prior to surgery?"  He came in for a consultation today. "Does he have anymore follow-ups scheduled?"  He does not have anymore follow-ups scheduled at this time.  "Did he go for his MRI that was scheduled for March 4?"  Yes, he had the MRI.  "What were the results of the MRI?"  He has a plantar fibroma, osteoarthritis and he has a cyst in his little toe on the left foot.  "Thank you for your assistance, that's all the information I need at this time."

## 2017-04-20 ENCOUNTER — Other Ambulatory Visit: Payer: Self-pay | Admitting: Podiatry

## 2017-04-20 MED ORDER — OXYCODONE-ACETAMINOPHEN 10-325 MG PO TABS: 1.0000 | ORAL_TABLET | ORAL | 0 refills | Status: AC | PRN

## 2017-04-20 MED ORDER — PROMETHAZINE HCL 25 MG PO TABS: 25.0000 mg | ORAL_TABLET | Freq: Three times a day (TID) | ORAL | 0 refills | Status: DC | PRN

## 2017-04-20 MED ORDER — CEPHALEXIN 500 MG PO CAPS: 500.0000 mg | ORAL_CAPSULE | Freq: Three times a day (TID) | ORAL | 0 refills | Status: DC

## 2017-04-21 ENCOUNTER — Encounter: Payer: Self-pay | Admitting: Podiatry

## 2017-04-21 DIAGNOSIS — D492 Neoplasm of unspecified behavior of bone, soft tissue, and skin: Secondary | ICD-10-CM | POA: Diagnosis not present

## 2017-04-21 DIAGNOSIS — M2012 Hallux valgus (acquired), left foot: Secondary | ICD-10-CM | POA: Diagnosis not present

## 2017-04-21 DIAGNOSIS — M25572 Pain in left ankle and joints of left foot: Secondary | ICD-10-CM | POA: Diagnosis not present

## 2017-04-21 DIAGNOSIS — M722 Plantar fascial fibromatosis: Secondary | ICD-10-CM | POA: Diagnosis not present

## 2017-04-21 DIAGNOSIS — I10 Essential (primary) hypertension: Secondary | ICD-10-CM | POA: Diagnosis not present

## 2017-04-21 DIAGNOSIS — D2122 Benign neoplasm of connective and other soft tissue of left lower limb, including hip: Secondary | ICD-10-CM | POA: Diagnosis not present

## 2017-04-21 MED FILL — CEPHALEXIN 500 MG CAPSULE: 500 | 10 days supply | Qty: 30 | Fill #0

## 2017-04-21 MED FILL — PROMETHAZINE 25 MG TABLET: 25 | 7 days supply | Qty: 20 | Fill #0

## 2017-04-21 MED FILL — INDOMETHACIN 50 MG CAPSULE: 50 | 30 days supply | Qty: 60 | Fill #0

## 2017-04-21 MED FILL — OXYCODONE-APAP 10-325 MG TA: 10-325 | 8 days supply | Qty: 30 | Fill #0

## 2017-04-21 MED FILL — traMADol HCL 50 MG TABS: 50 | 10 days supply | Qty: 30 | Fill #0

## 2017-04-24 ENCOUNTER — Telehealth: Payer: Self-pay | Admitting: *Deleted

## 2017-04-24 NOTE — Telephone Encounter (Signed)
POST OP CALL-    1) General condition stated by the patient: Good  2) Is the pt having pain? Some  3) Pain score: 5  4) Has the pt taken Rx'd medication? As needed  5) Is the pain medication giving relief? Yes  6) Any fever, chills, nausea, or vomiting? No  7) Any shortness of breath or tightness in the calf? No   8) Is the bandages clean, dry and intact? No  9) Is the bandage excessively tight? No  10) Is there excessive bleeding or drainage coming through the bandage? No  11) Did you understand all of the post op instruction sheet given? Asking about FMLA, advised to call Marcie Bal  12) Any questions or concerns regarding post op care/recovery? No    Confirmed POV appointment with patient

## 2017-04-27 ENCOUNTER — Ambulatory Visit (INDEPENDENT_AMBULATORY_CARE_PROVIDER_SITE_OTHER): Payer: BLUE CROSS/BLUE SHIELD | Admitting: Podiatry

## 2017-04-27 ENCOUNTER — Ambulatory Visit (INDEPENDENT_AMBULATORY_CARE_PROVIDER_SITE_OTHER): Payer: BLUE CROSS/BLUE SHIELD

## 2017-04-27 VITALS — Temp 96.8°F

## 2017-04-27 DIAGNOSIS — M2012 Hallux valgus (acquired), left foot: Secondary | ICD-10-CM | POA: Diagnosis not present

## 2017-04-27 DIAGNOSIS — M722 Plantar fascial fibromatosis: Secondary | ICD-10-CM

## 2017-04-27 MED ORDER — OXYCODONE-ACETAMINOPHEN 10-325 MG PO TABS: 1.0000 | ORAL_TABLET | Freq: Four times a day (QID) | ORAL | 0 refills | Status: DC | PRN

## 2017-04-27 NOTE — Progress Notes (Signed)
He presents today 1 week status post [facility] osteotomy left foot with excision of plantar fibroma left foot.  He states my toes feel funny and hurts worse at nighttime.  He denies fever chills nausea vomiting muscle aches and pains.  Objective: Vital signs are stable alert and oriented x3.  Pulses are palpable.  Neurologic sensorium is intact.  Deep tendon reflexes are intact.  Muscle strength and normal symmetrical.  Sutures are intact dorsally though there is some swelling beneath the incision consistent with a hematoma.  Plantar incision is stapled and is not as swollen.  I see no signs of infection.  Radiographs confirm capital osteotomy with internal fixation intact first metatarsal left.  Assessment: Well-healing surgical foot left.  Plan: Redressed today dresser compressive dressing refilled his narcotic and I will follow-up with him in 1 week.

## 2017-04-28 ENCOUNTER — Encounter: Payer: Self-pay | Admitting: Podiatry

## 2017-05-03 ENCOUNTER — Ambulatory Visit: Payer: BLUE CROSS/BLUE SHIELD | Admitting: Family Medicine

## 2017-05-03 ENCOUNTER — Encounter: Payer: Self-pay | Admitting: Family Medicine

## 2017-05-03 VITALS — BP 130/80 | HR 98 | Temp 98.1°F | Ht 74.0 in | Wt 246.1 lb

## 2017-05-03 DIAGNOSIS — E039 Hypothyroidism, unspecified: Secondary | ICD-10-CM | POA: Diagnosis not present

## 2017-05-03 DIAGNOSIS — Z720 Tobacco use: Secondary | ICD-10-CM

## 2017-05-03 LAB — TSH: TSH: 4.46 u[IU]/mL (ref 0.35–4.50)

## 2017-05-03 LAB — T4, FREE: FREE T4: 0.76 ng/dL (ref 0.60–1.60)

## 2017-05-03 MED FILL — OXYCODONE-APAP 10-325 MG TA: 10-325 | 5 days supply | Qty: 20 | Fill #0

## 2017-05-03 NOTE — Patient Instructions (Signed)
Hang in there.  We will let you know results of labs and if we need to change your medicine.  Stop smoking.  Let us know if you need anything.

## 2017-05-03 NOTE — Progress Notes (Signed)
Chief Complaint  Patient presents with  . Follow-up    weight loss    Subjective: Patient is a 42 y.o. male here for follow-up of hypothyroidism.  Reports compliance with medication- levothyroxine 50 mcg/d. Current symptoms include: weight loss Denies: denies fatigue, heat/cold intolerance, bowel/skin changes or CVS symptoms He believes his dose should be unchanged  ROS: Endo: +weight loss  Past Medical History:  Diagnosis Date  . Chicken pox as a child  . Elevated BP 04/04/2013  . Grief reaction 04/04/2013  . HTN (hypertension) 04/04/2013  . PTSD (post-traumatic stress disorder)   . Unspecified hypothyroidism 04/07/2013   No Known Allergies  Objective: BP 130/80 (BP Location: Left Arm, Patient Position: Sitting, Cuff Size: Large)   Pulse 98   Temp 98.1 F (36.7 C) (Oral)   Ht 6\' 2"  (1.88 m)   Wt 246 lb 2 oz (111.6 kg)   SpO2 97%   BMI 31.60 kg/m  General: Awake, appears stated age HEENT: No exophthalmos Neck: No thyromegaly or nodules appreciated Heart: RRR, no murmurs Lungs: CTAB, no rales, wheezes or rhonchi. No accessory muscle use Abd: BS+, soft, NT, ND, no masses or organomegaly Psych: Age appropriate judgment and insight, normal affect and mood  Assessment and Plan: Acquired hypothyroidism - Plan: TSH, T4, free  Orders as above. Hopefully we can keep in on current dose as he appears to be doing well. BP looks good.  Stop smoking. F/u in 4 mo.  The patient voiced understanding and agreement to the plan.  [place], DO 05/03/17  11:36 AM

## 2017-05-03 NOTE — Progress Notes (Signed)
Pre visit review using our clinic review tool, if applicable. No additional management support is needed unless otherwise documented below in the visit note. 

## 2017-05-04 ENCOUNTER — Ambulatory Visit (INDEPENDENT_AMBULATORY_CARE_PROVIDER_SITE_OTHER): Payer: BLUE CROSS/BLUE SHIELD | Admitting: Podiatry

## 2017-05-04 ENCOUNTER — Encounter: Payer: Self-pay | Admitting: Podiatry

## 2017-05-04 DIAGNOSIS — M722 Plantar fascial fibromatosis: Secondary | ICD-10-CM

## 2017-05-04 DIAGNOSIS — M2012 Hallux valgus (acquired), left foot: Secondary | ICD-10-CM

## 2017-05-04 NOTE — Progress Notes (Signed)
He presents today 2 weeks status post resection of plantar fibromas left foot.  Also a Austin bunion repair left foot.  He states that everything seems to be doing pretty well continues to use his crutches and his Cam walker.  Objective: Vital signs are stable he is alert and oriented x3 once the dressing was removed demonstrates staples are intact margins well coapted there is no erythema just mild edema no cellulitis drainage or odor has great range of motion of the first metatarsophalangeal joints left.  Assessment: Well-healing surgical foot left.  Plan: Redressed today dresser compressive dressing placed him back in his Cam walker at least for 1 more week I will follow-up with him at that time he will continue to not be nonweightbearing until the sutures and staples were removed removed at that time and placed in a Darco shoe.

## 2017-05-11 ENCOUNTER — Ambulatory Visit (INDEPENDENT_AMBULATORY_CARE_PROVIDER_SITE_OTHER): Payer: BLUE CROSS/BLUE SHIELD | Admitting: Podiatry

## 2017-05-11 ENCOUNTER — Encounter: Payer: Self-pay | Admitting: Podiatry

## 2017-05-11 DIAGNOSIS — M2012 Hallux valgus (acquired), left foot: Secondary | ICD-10-CM | POA: Diagnosis not present

## 2017-05-11 DIAGNOSIS — M722 Plantar fascial fibromatosis: Secondary | ICD-10-CM

## 2017-05-11 NOTE — Progress Notes (Signed)
He presents today date of surgery 04/21/2017 status post [facility] bunionectomy and excision of a plantar fibroma left foot.  He states that is feeling a lot better than it was he presents continuing to use his crutches and his Cam boot.  Denies calf pain chest pain shortness of breath.  Objective: Vital signs are stable alert and oriented x3.  Pulses are palpable.  Neurologic sensorium is intact.  Margins are well coapted staples are intact to the plantar aspect and sutures are intact to the dorsal aspect.  Assessment: Well-healing surgical foot.  Plan: Staples were removed today sutures were removed today placed in a compression anklet and he will continue use his Cam walker over the next few days and discontinue the use of the crutches I will follow-up with him in 2 weeks.

## 2017-05-25 ENCOUNTER — Ambulatory Visit (INDEPENDENT_AMBULATORY_CARE_PROVIDER_SITE_OTHER): Payer: BLUE CROSS/BLUE SHIELD | Admitting: Podiatry

## 2017-05-25 ENCOUNTER — Encounter: Payer: Self-pay | Admitting: Podiatry

## 2017-05-25 DIAGNOSIS — M722 Plantar fascial fibromatosis: Secondary | ICD-10-CM

## 2017-05-25 DIAGNOSIS — M2012 Hallux valgus (acquired), left foot: Secondary | ICD-10-CM

## 2017-05-25 MED ORDER — OXYCODONE-ACETAMINOPHEN 10-325 MG PO TABS: 1.0000 | ORAL_TABLET | ORAL | 0 refills | Status: AC | PRN

## 2017-05-25 MED FILL — OXYCODONE-APAP 10-325 MG TA: 10-325 | 5 days supply | Qty: 25 | Fill #0

## 2017-05-25 NOTE — Progress Notes (Signed)
He presents today for follow-up of his Austin bunionectomy and excision plantar fibroma of his left foot.  He states he is doing pretty well there is still gets a lot of swelling in that area.  Date of surgery April 21, 2017.  He denies fever chills nausea vomiting muscle aches and pains admits to being up on the foot more than he is off of it.  Objective: Vital signs are stable he is alert and oriented x3.  Pulses are palpable.  Neurologic sensorium is intact.  Considerable swelling about the first metatarsophalangeal joint is hard and indurated this is strictly from being on the foot too much.  Incision site is gone on to heal uneventfully.  He has good range of motion though somewhat tender first metatarsophalangeal joint.  The incision site to the plantar aspect of foot is healed 100% there is no recurrence.  Assessment: Well-healing surgical foot.  Plan: I encouraged range of motion exercises will follow back with him in 1 month.  I refilled a prescription for Percocet for him while he is working on his physical therapy and I will follow-up with him at that time

## 2017-06-13 ENCOUNTER — Ambulatory Visit (INDEPENDENT_AMBULATORY_CARE_PROVIDER_SITE_OTHER): Payer: BLUE CROSS/BLUE SHIELD | Admitting: Podiatry

## 2017-06-13 ENCOUNTER — Ambulatory Visit (INDEPENDENT_AMBULATORY_CARE_PROVIDER_SITE_OTHER): Payer: BLUE CROSS/BLUE SHIELD

## 2017-06-13 ENCOUNTER — Encounter: Payer: Self-pay | Admitting: Podiatry

## 2017-06-13 ENCOUNTER — Other Ambulatory Visit: Payer: Self-pay | Admitting: Podiatry

## 2017-06-13 DIAGNOSIS — M2012 Hallux valgus (acquired), left foot: Secondary | ICD-10-CM

## 2017-06-13 DIAGNOSIS — Z9889 Other specified postprocedural states: Secondary | ICD-10-CM

## 2017-06-13 DIAGNOSIS — M21619 Bunion of unspecified foot: Secondary | ICD-10-CM

## 2017-06-14 ENCOUNTER — Telehealth: Payer: Self-pay | Admitting: *Deleted

## 2017-06-14 NOTE — Telephone Encounter (Signed)
I spoke with Maeser and she states she received the 06/13/2017 referral and will contact pt for scheduling.

## 2017-06-14 NOTE — Telephone Encounter (Signed)
-----   Message from [place] sent at 06/13/2017  3:46 PM EDT ----- Regarding: PT PT orders are in for [facility]

## 2017-06-14 NOTE — Progress Notes (Signed)
He presents today for follow-up of his Liane [facility] bunionectomy date of surgery 04/21/2017 and excision of plantar fibroma of the left foot.  He states that he is still having pain swelling burning on top of the foot and on the bottom has a dull ache on the medial side around the first metatarsophalangeal joint with some tingling and numbness around the great toe.  He states that he can still barely wear a shoe.  Objective: Vital signs are stable he is alert and oriented x3 presents in his Darco shoe today.  He walks with a very mild antalgic gait.  Pulses are strongly palpable left foot.  Limited range of motion of the first metatarsophalangeal joint but considerable swelling and hard scar tissue associated with the first metatarsophalangeal joint the plantar aspect of the foot appears to be healing very nicely is considerable scar tissue buildup there as well to but it is less painful.  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  Assessment: Well-healed osteotomy.  Well-healed excision of plantar fibroma.  Plan: I will request that we continue him out of work at this point and that we get him started for physical therapy we are going to send him to the [facility] in [facility] for physical therapy.

## 2017-06-15 ENCOUNTER — Telehealth: Payer: Self-pay | Admitting: *Deleted

## 2017-06-15 DIAGNOSIS — Z9889 Other specified postprocedural states: Secondary | ICD-10-CM

## 2017-06-15 NOTE — Telephone Encounter (Signed)
PT to 1st MTPJ left and plantar fibroma resection left. 3x/wk x 4wks.    Increase ROM, decrease pain and help break up scar tissue.    Thanks.

## 2017-06-15 NOTE — Telephone Encounter (Signed)
[facility] request PT post op orders for 04/21/2017 DOS bunionectomy, ROM degree limit, etc.

## 2017-06-20 ENCOUNTER — Encounter: Payer: Self-pay | Admitting: Physical Therapy

## 2017-06-20 ENCOUNTER — Ambulatory Visit: Payer: BLUE CROSS/BLUE SHIELD | Attending: Podiatry | Admitting: Physical Therapy

## 2017-06-20 ENCOUNTER — Other Ambulatory Visit: Payer: Self-pay

## 2017-06-20 DIAGNOSIS — R262 Difficulty in walking, not elsewhere classified: Secondary | ICD-10-CM | POA: Diagnosis not present

## 2017-06-20 DIAGNOSIS — M25572 Pain in left ankle and joints of left foot: Secondary | ICD-10-CM | POA: Insufficient documentation

## 2017-06-20 DIAGNOSIS — M25675 Stiffness of left foot, not elsewhere classified: Secondary | ICD-10-CM | POA: Diagnosis not present

## 2017-06-20 DIAGNOSIS — M6281 Muscle weakness (generalized): Secondary | ICD-10-CM

## 2017-06-20 NOTE — Therapy (Signed)
[facility] [address]  [place], [place], [address] Phone: [phone]   Fax:  [phone]  Physical Therapy Evaluation  Patient Details  Name: Tony Walters MRN: [id number] Date of Birth: [birth year] Referring Provider: Tyson Dense, DPM   Encounter Date: 06/20/2017  PT End of Session - 06/20/17 1243    Visit Number  1    Number of Visits  13    Date for PT Re-Evaluation  07/18/17    Authorization Type  [employer]BS    PT Start Time  0849    PT Stop Time  0931    PT Time Calculation (min)  42 min    Activity Tolerance  Patient tolerated treatment well    Behavior During Therapy  [facility] for tasks assessed/performed       Past Medical History:  Diagnosis Date  . Chicken pox as a child  . Elevated BP 04/04/2013  . Grief reaction 04/04/2013  . HTN (hypertension) 04/04/2013  . PTSD (post-traumatic stress disorder)   . Unspecified hypothyroidism 04/07/2013    Past Surgical History:  Procedure Laterality Date  . HYDROCELE EXCISION / REPAIR  42 years old  . WISDOM TOOTH EXTRACTION  2010    There were no vitals filed for this visit.   Subjective Assessment - 06/20/17 0852    Subjective  Patient reports he had a L bunionectomy and plantar fibroma removal on 04/21/17. Used crutches after the surgery but is currently walking in Darco shoe without AD. Patient reports he is unable to wear normal shoe d/t pain and swelling. Reports intermittent shooting and N/T in L 1st digit of foot. Reports flexion/extension of 1st digit is not painful, just stiff. 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 Been out of work since Feb, until June 29.    Limitations  House hold activities;Walking;Standing    How long can you stand comfortably?  10 min     How long can you walk comfortably?  20 min    Diagnostic tests  06/13/17 x ray to L foot: "some osteoarthritic processes to 1st MTP joint but a well-healed  osteotomy"    Patient Stated Goals  get full bending motion in toe, reduce swelling, get back to work    Currently in Pain?  Yes    Pain Score  7     Pain Location  Foot 1st digit    Pain Orientation  Left    Pain Descriptors / Indicators  Shooting;Aching    Pain Type  Acute pain    Aggravating Factors   prolonged walking and standing    Pain Relieving Factors  pain meds, elevation         [facility] PT Assessment - 06/20/17 0905      Assessment   Medical Diagnosis  Post-operative state (L bunionectomy and plantar fibroma excision)    Referring Provider  Tyson Dense, DPM    Onset Date/Surgical Date  04/21/17    Next MD Visit  Unknown    Prior Therapy  No      Precautions   Precautions  -- Darco boot      Restrictions   Weight Bearing Restrictions  No      Balance Screen   Has the patient fallen in the past 6 months  No    Has the patient had a decrease in activity level because of a fear of falling?  No    Is the patient reluctant to leave their home because of a fear of falling?   No      Home Social worker  Private residence    Living Arrangements  Alone    Available Help at Discharge  -- No    Type of [place] to enter    Entrance Stairs-Number of Steps  20    Entrance Stairs-Rails  Left    Home Layout  One level    [place]      Prior Function   Level of Independence  Independent    Vocation  Full time employment    Vocation Requirements  prolonged standing & walking    Leisure  basketball      Cognition   Overall Cognitive Status  Within Functional Limits for tasks assessed      Observation/Other Assessments   Focus on Therapeutic Outcomes (FOTO)   Foot: 48 (52% limited, 28% predicted)      Observation/Other Assessments-Edema    Edema  -- considerable swelling at L 1st MTP      Sensation   Light Touch  Appears Intact    Additional Comments  good scar healing observed at both incisions       Coordination   Gross Motor Movements are Fluid and Coordinated  Yes    Fine Motor Movements are Fluid and Coordinated  Yes      Posture/Postural Control   Posture/Postural Control  Postural limitations    Postural Limitations  Forward head;Rounded Shoulders      ROM / Strength   AROM / PROM / Strength  AROM;PROM;Strength      AROM   AROM Assessment Site  Ankle    Right/Left Ankle  Right;Left    Right Ankle Dorsiflexion  0 R big toe ex: 15, flex: 75 degrees    Right Ankle Plantar Flexion  40    Left Ankle Dorsiflexion  5 L great toe ex: 3 degrees, L great toe flex: 45    Left Ankle Plantar Flexion  50      PROM   PROM Assessment Site  Ankle    Right/Left Ankle  Left    Left Ankle Dorsiflexion  -- L great toe ex: 7 degrees, flex: 45      Strength   Strength Assessment Site  Hip;Knee;Ankle    Right/Left Hip  Right;Left    Right Hip Flexion  4+/5    Right Hip ABduction  4+/5    Right Hip ADduction  4+/5    Left Hip Flexion  4+/5    Left Hip ABduction  4+/5    Left Hip ADduction  4+/5    Right/Left Knee  Right;Left    Right Knee Flexion  4+/5    Right Knee Extension  4+/5    Left Knee Flexion  4/5    Left Knee Extension  4+/5    Right/Left Ankle  Right;Left    Right Ankle Dorsiflexion  4+/5    Right Ankle Plantar Flexion  4+/5    Left Ankle Dorsiflexion  4/5    Left Ankle Plantar Flexion  4+/5      Flexibility   Soft Tissue Assessment /Muscle Length  --      Palpation   Palpation comment  1st MTP hypomobility on L > R      Ambulation/Gait   Gait Pattern  Step-through pattern;Decreased weight shift to left;Left  foot flat slight decrease in speed                Objective measurements completed on examination: See above findings.              PT Education - 06/20/17 1243    Education provided  Yes    Education Details  prognosis, POC, HEP    Person(s) Educated  Patient    Methods  Explanation;Demonstration;Handout    Comprehension  Verbalized  understanding       PT Short Term Goals - 06/20/17 1254      PT SHORT TERM GOAL #1   Title  Patient to be independent with inital HEP.    Time  2    Period  Weeks    Status  New    Target Date  07/04/17        PT Long Term Goals - 06/20/17 1255      PT LONG TERM GOAL #1   Title  Patient to be independent with advanced HEP.    Time  4    Period  Weeks    Status  New    Target Date  07/18/17      PT LONG TERM GOAL #2   Title  Patient to report tolerance of 1.5 hours of walking without c/o pain.    Time  4    Period  Weeks    Status  New    Target Date  07/18/17      PT LONG TERM GOAL #3   Title  Patient to demonstrate WFL L 1st MTP and ankle AROM to allow for efficient gait pattern.    Time  4    Period  Weeks    Status  New    Target Date  07/18/17      PT LONG TERM GOAL #4   Title  Patient to demonstrate >=4+/5 L LE strength.    Time  4    Period  Weeks    Status  New    Target Date  07/18/17      PT LONG TERM GOAL #5   Title  Patient to demonstrate reciprocal step-through pattern with use of 1 handrail as needed to climb up/down 1 flight of stairs to allow safe access to home.    Time  4    Period  Weeks    Status  New    Target Date  07/18/17             Plan - 06/20/17 1244    Clinical Impression Statement  Patient is a 42y/o M presenting to OPPT after undergoing a L bunionectomy and plantar fibroma excision on 04/21/17. Patient seen ambulating into clinic with Darco boot, reporting he is unable to tolerate wearing normal shoe at this time d/t pain and swelling. Current symptoms include intermittent shooting and N/T in L 1st digit of foot, movement non-specific. Presents with significant swelling, hypomobility, and decreased ROM of 1st MTP of L foot. Presents with the following impairments: decreased ROM, decreased strength, decreased flexibility, gait deviations, pain. Would benefit from skilled PT services 3x/week for 4 weeks to address  aforementioned impairments. Patient educated on and received HEP; reported understanding. Advised patient to ice and elevate L LE to decrease edema and to check L foot intermittently to avoid over-icing d/t N/T. Patient reported understanding.    History and Personal Factors relevant to plan of care:  HTN, hypothyroidism    Clinical Presentation  Stable  Clinical Decision Making  Low    Rehab Potential  Good    PT Frequency  3x / week    PT Duration  4 weeks    PT Treatment/Interventions  ADLs/Self Care Home Management;Cryotherapy;Electrical Stimulation;Iontophoresis 4mg /ml Dexamethasone;Moist Heat;Therapeutic exercise;Therapeutic activities;Functional mobility training;Stair training;Gait training;Ultrasound;Balance training;Neuromuscular re-education;Patient/family education;Orthotic Fit/Training;Manual techniques;Vasopneumatic Device;Taping;Splinting;Dry needling;Passive range of motion;Scar mobilization;Compression bandaging    PT Next Visit Plan  Reassess HEP, bike, manual therapy to L foot    Consulted and Agree with Plan of Care  Patient       Patient will benefit from skilled therapeutic intervention in order to improve the following deficits and impairments:  Hypomobility, Impaired sensation, Increased edema, Pain, Decreased strength, Decreased activity tolerance, Decreased mobility, Difficulty walking, Decreased range of motion, Impaired flexibility, Postural dysfunction  Visit Diagnosis: Stiffness of left foot, not elsewhere classified  Pain in left ankle and joints of left foot  Muscle weakness (generalized)  Difficulty in walking, not elsewhere classified     Problem List Patient Active Problem List   Diagnosis Date Noted  . Tobacco abuse 05/03/2017  . Depression with anxiety 05/09/2013  . Obesity, unspecified 04/07/2013  . Insomnia 04/07/2013  . Hypothyroidism 04/07/2013  . Essential hypertension 04/04/2013  . Grief reaction 04/04/2013    Janene Harvey,  PT, DPT 06/20/17 1:01 PM   [facility] [address]  [place], [place], [address] Phone: [phone]   Fax:  [phone]  Name: CHRISTHOPER BUSBEE MRN: [id number] Date of Birth: [birth year]

## 2017-06-21 ENCOUNTER — Ambulatory Visit: Payer: BLUE CROSS/BLUE SHIELD

## 2017-06-21 DIAGNOSIS — M6281 Muscle weakness (generalized): Secondary | ICD-10-CM | POA: Diagnosis not present

## 2017-06-21 DIAGNOSIS — M25572 Pain in left ankle and joints of left foot: Secondary | ICD-10-CM

## 2017-06-21 DIAGNOSIS — M25675 Stiffness of left foot, not elsewhere classified: Secondary | ICD-10-CM | POA: Diagnosis not present

## 2017-06-21 DIAGNOSIS — R262 Difficulty in walking, not elsewhere classified: Secondary | ICD-10-CM | POA: Diagnosis not present

## 2017-06-21 NOTE — Therapy (Signed)
[facility] [address]  [place], [place], [address] Phone: [phone]   Fax:  [phone]  Physical Therapy Treatment  Patient Details  Name: Tony Walters MRN: [id number] Date of Birth: 1975-02-04 Referring Provider: Tyson Dense, DPM   Encounter Date: 06/21/2017  PT End of Session - 06/21/17 0805    Visit Number  2    Number of Visits  13    Date for PT Re-Evaluation  07/18/17    Authorization Type  BC[employer]    PT Start Time  0801    PT Stop Time  0851    PT Time Calculation (min)  50 min    Activity Tolerance  Patient tolerated treatment well    Behavior During Therapy  [facility] for tasks assessed/performed       Past Medical History:  Diagnosis Date  . Chicken pox as a child  . Elevated BP 04/04/2013  . Grief reaction 04/04/2013  . HTN (hypertension) 04/04/2013  . PTSD (post-traumatic stress disorder)   . Unspecified hypothyroidism 04/07/2013    Past Surgical History:  Procedure Laterality Date  . HYDROCELE EXCISION / REPAIR  42 years old  . WISDOM TOOTH EXTRACTION  2010    There were no vitals filed for this visit.  Subjective Assessment - 06/21/17 0804    Subjective  Pt. noting MD did not give him restrictions to only wear boot.  He has worn regular shoe x 4 since surgery for short distances.      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  No/denies    Pain Score  0-No pain 7/10 with prolonged standing     Multiple Pain Sites  No                       [facility] Adult PT Treatment/Exercise - 06/21/17 0825      Ambulation/Gait   Ambulation/Gait  --    Ambulation/Gait Assistance  --    Stairs  Yes    Stairs Assistance  5: Supervision    Stairs Assistance Details (indicate cue type and reason)  1 rail on  R    Number of Stairs  14    Height of Stairs  8    Gait Comments  Pt. able to ascend/descend stair x 14 with 1 rail use and good reciprocal pattern       Modalities   Modalities  Vasopneumatic      Vasopneumatic   Number Minutes Vasopneumatic   10 minutes    Vasopnuematic Location   Ankle L     Vasopneumatic Pressure  Medium    Vasopneumatic Temperature   Coldest temp.        Manual Therapy   Manual Therapy  Soft tissue mobilization;Passive ROM;Joint mobilization    Manual therapy comments  Supine     Joint Mobilization  L 1st MTP mobs for improved ROM    Soft tissue mobilization  STM/strumming to L plantar surface of foot     Passive ROM  L DF+great toe stretch with therapist 2 x 30 sec       Ankle Exercises: Seated   Heel Raises  Both;15 reps;3 seconds    Toe Raise  15 reps;3 seconds  BAPS  Sitting;Level 2;10 reps CW, CCW, R/L, front/back    Other Seated Ankle Exercises  L DF, PF with red TB x 15 reps       Ankle Exercises: Stretches   Plantar Fascia Stretch  2 reps;30 seconds    Plantar Fascia Stretch Limitations  + great toe extension stretch in seated              PT Education - 06/20/17 1243    Education provided  Yes    Education Details  prognosis, POC, HEP    Person(s) Educated  Patient    Methods  Explanation;Demonstration;Handout    Comprehension  Verbalized understanding       PT Short Term Goals - 06/21/17 0824      PT SHORT TERM GOAL #1   Title  Patient to be independent with inital HEP.    Time  2    Period  Weeks    Status  On-going        PT Long Term Goals - 06/21/17 0865      PT LONG TERM GOAL #1   Title  Patient to be independent with advanced HEP.    Time  4    Period  Weeks    Status  On-going      PT LONG TERM GOAL #2   Title  Patient to report tolerance of 1.5 hours of walking without c/o pain.    Time  4    Period  Weeks    Status  On-going      PT LONG TERM GOAL #3   Title  Patient to demonstrate WFL L 1st MTP and  ankle AROM to allow for efficient gait pattern.    Time  4    Period  Weeks    Status  On-going      PT LONG TERM GOAL #4   Title  Patient to demonstrate >=4+/5 L LE strength.    Time  4    Period  Weeks    Status  On-going      PT LONG TERM GOAL #5   Title  Patient to demonstrate reciprocal step-through pattern with use of 1 handrail as needed to climb up/down 1 flight of stairs to allow safe access to home.    Time  4    Period  Weeks    Status  On-going            Plan - 06/21/17 0810    Clinical Impression Statement  Tony Walters doing well today with no new complaints.  Pt. noting he has worn regular shoe x 4 for ~ 30 min at a time since surgery however due to swelling has difficulty tying shoe.  Pt. encouraged to begin wearing regular shoe on L foot for one hour daily and build tolerance with additional 30 min a day ongoing as tolerated.  Pt. able to navigate stairs reciprocally ascending/descending with good stability today and tolerated all seated ROM/strengthening activities well.  Ended treatment with ice/compression to L ankle to reduce post-exercise swelling and pain.  Pt. educated on [place] today and encouraged to ice L foot/ankle 4 x 15 min daily to promote reduction in swelling.  Will continue to progress toward goals.    PT Treatment/Interventions  ADLs/Self Care Home Management;Cryotherapy;Electrical Stimulation;Iontophoresis 4mg /ml Dexamethasone;Moist Heat;Therapeutic exercise;Therapeutic activities;Functional mobility training;Stair training;Gait training;Ultrasound;Balance training;Neuromuscular re-education;Patient/family education;Orthotic Fit/Training;Manual techniques;Vasopneumatic Device;Taping;Splinting;Dry needling;Passive range of motion;Scar mobilization;Compression bandaging       Patient will benefit from skilled therapeutic intervention  in order to improve the following deficits and impairments:  Hypomobility, Impaired sensation, Increased edema, Pain,  Decreased strength, Decreased activity tolerance, Decreased mobility, Difficulty walking, Decreased range of motion, Impaired flexibility, Postural dysfunction  Visit Diagnosis: Stiffness of left foot, not elsewhere classified  Pain in left ankle and joints of left foot  Muscle weakness (generalized)  Difficulty in walking, not elsewhere classified     Problem List Patient Active Problem List   Diagnosis Date Noted  . Tobacco abuse 05/03/2017  . Depression with anxiety 05/09/2013  . Obesity, unspecified 04/07/2013  . Insomnia 04/07/2013  . Hypothyroidism 04/07/2013  . Essential hypertension 04/04/2013  . Grief reaction 04/04/2013    Tony Walters, Tony Walters 06/21/17 10:12 AM  [facility] [address]  [place], [place], [address] Phone: [phone]   Fax:  [phone]  Name: Tony Walters MRN: [id number] Date of Birth: 1975/12/16

## 2017-06-23 ENCOUNTER — Ambulatory Visit: Payer: BLUE CROSS/BLUE SHIELD | Admitting: Physical Therapy

## 2017-06-23 ENCOUNTER — Encounter: Payer: Self-pay | Admitting: Physical Therapy

## 2017-06-23 DIAGNOSIS — M25572 Pain in left ankle and joints of left foot: Secondary | ICD-10-CM

## 2017-06-23 DIAGNOSIS — M6281 Muscle weakness (generalized): Secondary | ICD-10-CM

## 2017-06-23 DIAGNOSIS — M25675 Stiffness of left foot, not elsewhere classified: Secondary | ICD-10-CM | POA: Diagnosis not present

## 2017-06-23 DIAGNOSIS — R262 Difficulty in walking, not elsewhere classified: Secondary | ICD-10-CM

## 2017-06-23 NOTE — Therapy (Signed)
[facility] [address]  [place], [place], [address] Phone: [phone]   Fax:  [phone]  Physical Therapy Treatment  Patient Details  Name: Tony Walters MRN: [id number] Date of Birth: [birth year] Referring Provider: Tyson Dense, DPM   Encounter Date: 06/23/2017  PT End of Session - 06/23/17 0842    Visit Number  3    Number of Visits  13    Date for PT Re-Evaluation  07/18/17    Authorization Type  BCBS    PT Start Time  0756    PT Stop Time  0850    PT Time Calculation (min)  54 min    Activity Tolerance  Patient tolerated treatment well    Behavior During Therapy  [facility] for tasks assessed/performed       Past Medical History:  Diagnosis Date  . Chicken pox as a child  . Elevated BP 04/04/2013  . Grief reaction 04/04/2013  . HTN (hypertension) 04/04/2013  . PTSD (post-traumatic stress disorder)   . Unspecified hypothyroidism 04/07/2013    Past Surgical History:  Procedure Laterality Date  . HYDROCELE EXCISION / REPAIR  42 years old  . WISDOM TOOTH EXTRACTION  2010    There were no vitals filed for this visit.  Subjective Assessment - 06/23/17 0758    Subjective  Patient reports soreness under L MTP; started yesterday afternoon. Reports no c/o new pain at end of last session. Reports he may have done his exercises a little too hard at home.     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  Yes    Pain Score  4     Pain Location  Foot    Pain Orientation  Left    Pain Descriptors / Indicators  Shooting;Aching    Pain Type  Acute pain                       [facility] Adult PT Treatment/Exercise - 06/23/17 0800      Vasopneumatic   Number Minutes Vasopneumatic   10 minutes    Vasopnuematic Location    Ankle L    Vasopneumatic Pressure  Medium    Vasopneumatic Temperature   Coldest temp.        Manual Therapy   Manual Therapy  Soft tissue mobilization;Joint mobilization    Manual therapy comments  Supine     Joint Mobilization  L 1st MTP distraction and mobs into flex/ex; grade III    Soft tissue mobilization  L plantar fascia, achilles, distal gastroc    Passive ROM  L DF+great toe stretch with therapist 3 x 30 sec       Ankle Exercises: Stretches   Gastroc Stretch  2 reps;20 seconds each LE; prostretch      Ankle Exercises: Aerobic   Stationary Bike  L3x73min      Ankle Exercises: Standing   SLS  L LE on foam; 2x30 sec with UR support on counter top    Other Standing Ankle Exercises  Step-through and back on foam; 20x on L LE CGA for balance      Ankle Exercises: Seated   Heel Raises  Both;3 seconds;20 reps    Toe Raise  3 seconds;20  reps B LEs    BAPS  Sitting;Level 2;10 reps 10x DF/PF, 10x pron/sup               PT Short Term Goals - 06/21/17 5035      PT SHORT TERM GOAL #1   Title  Patient to be independent with inital HEP.    Time  2    Period  Weeks    Status  On-going        PT Long Term Goals - 06/21/17 4656      PT LONG TERM GOAL #1   Title  Patient to be independent with advanced HEP.    Time  4    Period  Weeks    Status  On-going      PT LONG TERM GOAL #2   Title  Patient to report tolerance of 1.5 hours of walking without c/o pain.    Time  4    Period  Weeks    Status  On-going      PT LONG TERM GOAL #3   Title  Patient to demonstrate WFL L 1st MTP and ankle AROM to allow for efficient gait pattern.    Time  4    Period  Weeks    Status  On-going      PT LONG TERM GOAL #4   Title  Patient to demonstrate >=4+/5 L LE strength.    Time  4    Period  Weeks    Status  On-going      PT LONG TERM GOAL #5   Title  Patient to demonstrate reciprocal step-through pattern with use of 1 handrail as needed to climb up/down 1 flight of stairs  to allow safe access to home.    Time  4    Period  Weeks    Status  On-going            Plan - 06/23/17 0932    Clinical Impression Statement  Patient arrived to session with tennis shoes on B feet with report of increased pain in L MTP; reports thinking he may have overdone his HEP. Patient with significantly less swelling surrounding incision site this session; reports he has been using ice. Tolerated STM to L L plantar fascia, Achilles, distal gastroc- soft restriction noted in plantar surface of foot near incision. Patient tolerated seated ankle and MTP ROM exercises and L LE SLS on foam without report of pain. Able to perform step-through stepping on L LE on foam, with encouragement to stretch into 1st MTP extension- mild report of pain. Advised patient to avoid pushing into pain during both clinic activities and HEP exercises; patient reported understanding. Received [place] to L foot at end of session; normal integumentary response observed.     PT Treatment/Interventions  ADLs/Self Care Home Management;Cryotherapy;Electrical Stimulation;Iontophoresis 4mg /ml Dexamethasone;Moist Heat;Therapeutic exercise;Therapeutic activities;Functional mobility training;Stair training;Gait training;Ultrasound;Balance training;Neuromuscular re-education;Patient/family education;Orthotic Fit/Training;Manual techniques;Vasopneumatic Device;Taping;Splinting;Dry needling;Passive range of motion;Scar mobilization;Compression bandaging    PT Next Visit Plan  progress ankle and MTP ROM    Consulted and Agree with Plan of Care  Patient       Patient will benefit from skilled therapeutic intervention in order to improve the following deficits and impairments:  Hypomobility, Impaired sensation, Increased edema, Pain, Decreased strength, Decreased activity tolerance, Decreased mobility, Difficulty walking, Decreased range of motion, Impaired flexibility, Postural dysfunction  Visit Diagnosis: Stiffness of left  foot, not elsewhere classified  Pain in left ankle and joints of left foot  Muscle weakness (  generalized)  Difficulty in walking, not elsewhere classified     Problem List Patient Active Problem List   Diagnosis Date Noted  . Tobacco abuse 05/03/2017  . Depression with anxiety 05/09/2013  . Obesity, unspecified 04/07/2013  . Insomnia 04/07/2013  . Hypothyroidism 04/07/2013  . Essential hypertension 04/04/2013  . Grief reaction 04/04/2013    Janene Harvey, PT, DPT 06/23/17 9:34 AM   [facility] [address]  [place], [place], [address] Phone: [phone]   Fax:  [phone]  Name: Tony Walters MRN: [id number] Date of Birth: 1975-08-05

## 2017-06-26 ENCOUNTER — Ambulatory Visit: Payer: BLUE CROSS/BLUE SHIELD | Attending: Podiatry

## 2017-06-26 DIAGNOSIS — R262 Difficulty in walking, not elsewhere classified: Secondary | ICD-10-CM | POA: Diagnosis not present

## 2017-06-26 DIAGNOSIS — M25572 Pain in left ankle and joints of left foot: Secondary | ICD-10-CM | POA: Diagnosis not present

## 2017-06-26 DIAGNOSIS — M6281 Muscle weakness (generalized): Secondary | ICD-10-CM | POA: Diagnosis not present

## 2017-06-26 DIAGNOSIS — M25675 Stiffness of left foot, not elsewhere classified: Secondary | ICD-10-CM | POA: Diagnosis not present

## 2017-06-26 NOTE — Therapy (Signed)
[facility] [address]  [place], [place], [address] Phone: [phone]   Fax:  [phone]  Physical Therapy Treatment  Patient Details  Name: Tony Walters MRN: [id number] Date of Birth: Dec 07, 1975 Referring Provider: Tyson Dense, DPM   Encounter Date: 06/26/2017  PT End of Session - 06/26/17 0820    Visit Number  4    Number of Visits  13    Date for PT Re-Evaluation  07/18/17    Authorization Type  BCBS    PT Start Time  1779 Pt. arrived late     PT Stop Time  0856    PT Time Calculation (min)  42 min    Activity Tolerance  Patient tolerated treatment well    Behavior During Therapy  [facility] for tasks assessed/performed       Past Medical History:  Diagnosis Date  . Chicken pox as a child  . Elevated BP 04/04/2013  . Grief reaction 04/04/2013  . HTN (hypertension) 04/04/2013  . PTSD (post-traumatic stress disorder)   . Unspecified hypothyroidism 04/07/2013    Past Surgical History:  Procedure Laterality Date  . HYDROCELE EXCISION / REPAIR  42 years old  . WISDOM TOOTH EXTRACTION  2010    There were no vitals filed for this visit.  Subjective Assessment - 06/26/17 0817    Subjective  Pt. reporting improved walking tolerance for tennis shoes.      How long can you stand comfortably?  1.5 hours     How long can you walk comfortably?  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  Yes    Pain Score  4     Pain Location  Foot    Pain Orientation  Left    Pain Descriptors / Indicators  Aching    Pain Type  Acute pain    Pain Frequency  Intermittent                       [facility] Adult PT Treatment/Exercise - 06/26/17 0831      Neuro Re-ed    Neuro Re-ed Details   B Staggered stance, tandem  stance at counter standing on floor with eyes closed and diagonal head turns; intermittent counter support x 20 sec each way       Vasopneumatic   Number Minutes Vasopneumatic   10 minutes    Vasopnuematic Location   Ankle L    Vasopneumatic Pressure  Medium    Vasopneumatic Temperature   Coldest temp.        Manual Therapy   Manual Therapy  Soft tissue mobilization;Joint mobilization    Manual therapy comments  Supine     Joint Mobilization  L 1st MTP distraction and mobs into flex/ex; grade III    Soft tissue mobilization  L plantar fascia     Passive ROM  L DF+great toe stretch with therapist x 30 sec       Ankle Exercises: Stretches   Plantar Fascia Stretch  2 reps;30 seconds    Plantar Fascia Stretch Limitations  + great toe extension stretch in seated       Ankle Exercises: Seated   Heel Raises  Both;3 seconds;20 reps  Toe Raise  3 seconds;20 reps    Other Seated Ankle Exercises  B ankle EV with yellow TB x 15 reps       Ankle Exercises: Aerobic   Recumbent Bike  Lvl 2, 6 min       Ankle Exercises: Standing   Balance Beam  side stepping on blue foam beam x 3 laps down back                PT Short Term Goals - 06/21/17 0824      PT SHORT TERM GOAL #1   Title  Patient to be independent with inital HEP.    Time  2    Period  Weeks    Status  On-going        PT Long Term Goals - 06/21/17 7846      PT LONG TERM GOAL #1   Title  Patient to be independent with advanced HEP.    Time  4    Period  Weeks    Status  On-going      PT LONG TERM GOAL #2   Title  Patient to report tolerance of 1.5 hours of walking without c/o pain.    Time  4    Period  Weeks    Status  On-going      PT LONG TERM GOAL #3   Title  Patient to demonstrate WFL L 1st MTP and ankle AROM to allow for efficient gait pattern.    Time  4    Period  Weeks    Status  On-going      PT LONG TERM GOAL #4   Title  Patient to demonstrate >=4+/5 L LE strength.    Time  4    Period   Weeks    Status  On-going      PT LONG TERM GOAL #5   Title  Patient to demonstrate reciprocal step-through pattern with use of 1 handrail as needed to climb up/down 1 flight of stairs to allow safe access to home.    Time  4    Period  Weeks    Status  On-going            Plan - 06/26/17 9629    Clinical Impression Statement  Pt. arrived late thus session time limited today.  Reports improvement in walking tolerance for tennis shoes now.  Notes he is averaging 4-5 hours walking daily with tennis shoes.  Tolerated progression of standing activities to side stepping on compliance surfaces well today.  Some proprioception training on compliant surfaces with head turns/eyes closed with pt. noting difficulty with staggered stance and tandem stance positions.  Progressing well towards goals.      PT Treatment/Interventions  ADLs/Self Care Home Management;Cryotherapy;Electrical Stimulation;Iontophoresis 4mg /ml Dexamethasone;Moist Heat;Therapeutic exercise;Therapeutic activities;Functional mobility training;Stair training;Gait training;Ultrasound;Balance training;Neuromuscular re-education;Patient/family education;Orthotic Fit/Training;Manual techniques;Vasopneumatic Device;Taping;Splinting;Dry needling;Passive range of motion;Scar mobilization;Compression bandaging    PT Next Visit Plan  progress ankle and MTP ROM    Consulted and Agree with Plan of Care  Patient       Patient will benefit from skilled therapeutic intervention in order to improve the following deficits and impairments:  Hypomobility, Impaired sensation, Increased edema, Pain, Decreased strength, Decreased activity tolerance, Decreased mobility, Difficulty walking, Decreased range of motion, Impaired flexibility, Postural dysfunction  Visit Diagnosis: Stiffness of left foot, not elsewhere classified  Pain in left ankle and joints of left foot  Muscle weakness (generalized)  Difficulty in walking, not elsewhere  classified  Problem List Patient Active Problem List   Diagnosis Date Noted  . Tobacco abuse 05/03/2017  . Depression with anxiety 05/09/2013  . Obesity, unspecified 04/07/2013  . Insomnia 04/07/2013  . Hypothyroidism 04/07/2013  . Essential hypertension 04/04/2013  . Grief reaction 04/04/2013    Bess Harvest, PTA 06/26/17 5:50 PM  [facility] [address]  [place], [place], [address] Phone: [phone]   Fax:  [phone]  Name: Tony Walters MRN: [id number] Date of Birth: 11-01-1975

## 2017-06-28 ENCOUNTER — Ambulatory Visit: Payer: BLUE CROSS/BLUE SHIELD

## 2017-06-28 DIAGNOSIS — M6281 Muscle weakness (generalized): Secondary | ICD-10-CM | POA: Diagnosis not present

## 2017-06-28 DIAGNOSIS — R262 Difficulty in walking, not elsewhere classified: Secondary | ICD-10-CM | POA: Diagnosis not present

## 2017-06-28 DIAGNOSIS — M25675 Stiffness of left foot, not elsewhere classified: Secondary | ICD-10-CM

## 2017-06-28 DIAGNOSIS — M25572 Pain in left ankle and joints of left foot: Secondary | ICD-10-CM | POA: Diagnosis not present

## 2017-06-28 NOTE — Therapy (Signed)
[facility] [address]  [place], [place], [address] Phone: [phone]   Fax:  [phone]  Physical Therapy Treatment  Patient Details  Name: Tony Walters MRN: [id number] Date of Birth: 07/11/1975 Referring Provider: Tyson Dense, DPM   Encounter Date: 06/28/2017  PT End of Session - 06/28/17 0810    Visit Number  5    Number of Visits  13    Date for PT Re-Evaluation  07/18/17    Authorization Type  BCBS    PT Start Time  0803    PT Stop Time  0855    PT Time Calculation (min)  52 min    Activity Tolerance  Patient tolerated treatment well    Behavior During Therapy  [facility] for tasks assessed/performed       Past Medical History:  Diagnosis Date  . Chicken pox as a child  . Elevated BP 04/04/2013  . Grief reaction 04/04/2013  . HTN (hypertension) 04/04/2013  . PTSD (post-traumatic stress disorder)   . Unspecified hypothyroidism 04/07/2013    Past Surgical History:  Procedure Laterality Date  . HYDROCELE EXCISION / REPAIR  42 years old  . WISDOM TOOTH EXTRACTION  2010    There were no vitals filed for this visit.  Subjective Assessment - 06/28/17 0808    Subjective  Pt. reporting ~ 5 hours daily wearing tennis shoes.  No increased soreness following last visit.      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  Yes    Pain Score  2     Pain Location  Foot    Pain Orientation  Left    Pain Descriptors / Indicators  Aching    Pain Type  Acute pain    Pain Frequency  Intermittent    Multiple Pain Sites  No                       [facility] Adult PT Treatment/Exercise - 06/28/17 0827      Vasopneumatic   Number Minutes Vasopneumatic   10 minutes    Vasopnuematic Location   Ankle    Vasopneumatic Pressure   Medium    Vasopneumatic Temperature   Coldest temp.        Manual Therapy   Manual Therapy  Soft tissue mobilization;Joint mobilization    Manual therapy comments  Supine     Joint Mobilization  L 1st MTP distraction and mobs into flex/ex; grade III    Soft tissue mobilization  L plantar fascia     Passive ROM  L DF+great toe stretch with therapist x 30 sec       Ankle Exercises: Standing   SLS  L LE on foam; 2x30 sec with opposite LE cone toe-touch with UE support on counter top    Heel Raises  10 reps;3 seconds    Heel Raises Limitations  at UBE      Ankle Exercises: Seated   Towel Crunch  5 reps    Other Seated Ankle Exercises  L EV/IV (red TB), DF (green TB) x 15 reps       Ankle Exercises: Aerobic   Recumbent Bike  Lvl 2, 6 min       Ankle  Exercises: Stretches   Soleus Stretch  30 seconds;2 reps    Soleus Stretch Limitations  at wall    Gastroc Stretch  2 reps;30 seconds    Gastroc Stretch Limitations  prostretch and at wall             PT Education - 06/28/17 1224    Education provided  Yes    Education Details  HEP update     Person(s) Educated  Patient    Methods  Explanation;Demonstration;Verbal cues;Handout    Comprehension  Verbalized understanding;Returned demonstration;Verbal cues required;Need further instruction       PT Short Term Goals - 06/28/17 0830      PT SHORT TERM GOAL #1   Title  Patient to be independent with inital HEP.    Time  2    Period  Weeks    Status  Achieved        PT Long Term Goals - 06/28/17 4196      PT LONG TERM GOAL #1   Title  Patient to be independent with advanced HEP.    Time  4    Period  Weeks    Status  On-going      PT LONG TERM GOAL #2   Title  Patient to report tolerance of 1.5 hours of walking without c/o pain.    Time  4    Period  Weeks    Status  On-going      PT LONG TERM GOAL #3   Title  Patient to demonstrate WFL L 1st MTP and ankle AROM to allow for efficient gait pattern.    Time  4     Period  Weeks    Status  On-going      PT LONG TERM GOAL #4   Title  Patient to demonstrate >=4+/5 L LE strength.    Time  4    Period  Weeks    Status  On-going      PT LONG TERM GOAL #5   Title  Patient to demonstrate reciprocal step-through pattern with use of 1 handrail as needed to climb up/down 1 flight of stairs to allow safe access to home.    Time  4    Period  Weeks    Status  Achieved            Plan - 06/28/17 2229    Clinical Impression Statement  Tony Walters noting improved tolerance for walking with tennis shoe now up to ~ 5 hours daily.  Tolerated advancement of SLS and compliant surfaces proprioception training well today.  Progressing well toward goals.        PT Treatment/Interventions  ADLs/Self Care Home Management;Cryotherapy;Electrical Stimulation;Iontophoresis 4mg /ml Dexamethasone;Moist Heat;Therapeutic exercise;Therapeutic activities;Functional mobility training;Stair training;Gait training;Ultrasound;Balance training;Neuromuscular re-education;Patient/family education;Orthotic Fit/Training;Manual techniques;Vasopneumatic Device;Taping;Splinting;Dry needling;Passive range of motion;Scar mobilization;Compression bandaging    PT Next Visit Plan  progress ankle and MTP ROM    Consulted and Agree with Plan of Care  Patient       Patient will benefit from skilled therapeutic intervention in order to improve the following deficits and impairments:  Hypomobility, Impaired sensation, Increased edema, Pain, Decreased strength, Decreased activity tolerance, Decreased mobility, Difficulty walking, Decreased range of motion, Impaired flexibility, Postural dysfunction  Visit Diagnosis: Stiffness of left foot, not elsewhere classified  Pain in left ankle and joints of left foot  Muscle weakness (generalized)  Difficulty in walking, not elsewhere classified     Problem List Patient Active Problem List   Diagnosis Date  Noted  . Tobacco abuse 05/03/2017  . Depression  with anxiety 05/09/2013  . Obesity, unspecified 04/07/2013  . Insomnia 04/07/2013  . Hypothyroidism 04/07/2013  . Essential hypertension 04/04/2013  . Grief reaction 04/04/2013    Bess Harvest, PTA 06/28/17 12:27 PM    [facility] [address]  [place], [place], [address] Phone: [phone]   Fax:  [phone]  Name: Tony Walters MRN: [id number] Date of Birth: 05/21/1975

## 2017-06-30 ENCOUNTER — Ambulatory Visit: Payer: BLUE CROSS/BLUE SHIELD

## 2017-06-30 DIAGNOSIS — M25572 Pain in left ankle and joints of left foot: Secondary | ICD-10-CM | POA: Diagnosis not present

## 2017-06-30 DIAGNOSIS — M6281 Muscle weakness (generalized): Secondary | ICD-10-CM | POA: Diagnosis not present

## 2017-06-30 DIAGNOSIS — R262 Difficulty in walking, not elsewhere classified: Secondary | ICD-10-CM | POA: Diagnosis not present

## 2017-06-30 DIAGNOSIS — M25675 Stiffness of left foot, not elsewhere classified: Secondary | ICD-10-CM

## 2017-06-30 NOTE — Patient Instructions (Signed)
   ARCH CREATION EXRECISE:   2 x 3" x 10 reps   1x/day

## 2017-06-30 NOTE — Therapy (Signed)
[facility] [address]  [place], [place], [address] Phone: [phone]   Fax:  [phone]  Physical Therapy Treatment  Patient Details  Name: Tony Walters MRN: [id number] Date of Birth: [birth year] Referring Provider: Tyson Dense, DPM   Encounter Date: 06/30/2017  PT End of Session - 06/30/17 0808    Visit Number  6    Number of Visits  13    Date for PT Re-Evaluation  07/18/17    Authorization Type  BCBS    PT Start Time  0801    PT Stop Time  0845    PT Time Calculation (min)  44 min    Activity Tolerance  Patient tolerated treatment well    Behavior During Therapy  [facility] for tasks assessed/performed       Past Medical History:  Diagnosis Date  . Chicken pox as a child  . Elevated BP 04/04/2013  . Grief reaction 04/04/2013  . HTN (hypertension) 04/04/2013  . PTSD (post-traumatic stress disorder)   . Unspecified hypothyroidism 04/07/2013    Past Surgical History:  Procedure Laterality Date  . HYDROCELE EXCISION / REPAIR  42 years old  . WISDOM TOOTH EXTRACTION  2010    There were no vitals filed for this visit.  Subjective Assessment - 06/30/17 0804    Subjective  Pt. noting increased pain walking with "regular", shoes yesterday without known trigger.  Pt. noting he is scheduled to return to work on 6.29.19.      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  Yes    Pain Score  2     Pain Location  Foot    Pain Orientation  Left    Pain Descriptors / Indicators  Aching    Pain Type  Acute pain    Pain Onset  More than a month ago    Pain Frequency  Intermittent    Aggravating Factors   Prolonged walking and standing     Multiple Pain Sites  No                        [facility] Adult PT Treatment/Exercise - 06/30/17 0818      Knee/Hip Exercises: Machines for Strengthening   Cybex Knee Flexion  B con/L ecc 25# 2 x 15 reps       Manual Therapy   Manual Therapy  Soft tissue mobilization;Joint mobilization    Manual therapy comments  Supine     Joint Mobilization  L 1st MTP distraction and mobs into flex/ex; grade III    Soft tissue mobilization  L plantar fascia     Passive ROM  L DF+great toe stretch with therapist x 30 sec       Ankle Exercises: Seated   Other Seated Ankle Exercises  L EV/IV (red TB), DF (green TB) x 20 reps       Ankle Exercises: Aerobic   Recumbent Bike  Lvl 2, 6 min       Ankle Exercises: Standing   Other Standing Ankle Exercises  L SLS 4-dir hip kicker with red TB at ankle x 10 rpes; 2 ski poles       Ankle  Exercises: Stretches   Soleus Stretch  30 seconds;2 reps    Soleus Stretch Limitations  at wall    Gastroc Stretch  2 reps;30 seconds    Gastroc Stretch Limitations  prostretch and at wall               PT Short Term Goals - 06/28/17 0830      PT SHORT TERM GOAL #1   Title  Patient to be independent with inital HEP.    Time  2    Period  Weeks    Status  Achieved        PT Long Term Goals - 06/28/17 2395      PT LONG TERM GOAL #1   Title  Patient to be independent with advanced HEP.    Time  4    Period  Weeks    Status  On-going      PT LONG TERM GOAL #2   Title  Patient to report tolerance of 1.5 hours of walking without c/o pain.    Time  4    Period  Weeks    Status  On-going      PT LONG TERM GOAL #3   Title  Patient to demonstrate WFL L 1st MTP and ankle AROM to allow for efficient gait pattern.    Time  4    Period  Weeks    Status  On-going      PT LONG TERM GOAL #4   Title  Patient to demonstrate >=4+/5 L LE strength.    Time  4    Period  Weeks    Status  On-going      PT LONG TERM GOAL #5   Title  Patient to demonstrate reciprocal  step-through pattern with use of 1 handrail as needed to climb up/down 1 flight of stairs to allow safe access to home.    Time  4    Period  Weeks    Status  Achieved            Plan - 06/30/17 0809    Clinical Impression Statement  Pt. noting decreased tolerance to walking with "regular" shoes yesterday however continues to tolerate ~ 3 hours walking with tennis shoes without issue.  Tolerated continued manual therapy focused on improving L DF + great toe extension ROM well.  Added 4-way hip kicker with L SLS today and progression of theraband ankle strengthening activities well today.  Will continue to progress toward goals.      PT Treatment/Interventions  ADLs/Self Care Home Management;Cryotherapy;Electrical Stimulation;Iontophoresis 4mg /ml Dexamethasone;Moist Heat;Therapeutic exercise;Therapeutic activities;Functional mobility training;Stair training;Gait training;Ultrasound;Balance training;Neuromuscular re-education;Patient/family education;Orthotic Fit/Training;Manual techniques;Vasopneumatic Device;Taping;Splinting;Dry needling;Passive range of motion;Scar mobilization;Compression bandaging    PT Next Visit Plan  progress ankle and MTP ROM    Consulted and Agree with Plan of Care  Patient       Patient will benefit from skilled therapeutic intervention in order to improve the following deficits and impairments:  Hypomobility, Impaired sensation, Increased edema, Pain, Decreased strength, Decreased activity tolerance, Decreased mobility, Difficulty walking, Decreased range of motion, Impaired flexibility, Postural dysfunction  Visit Diagnosis: Stiffness of left foot, not elsewhere classified  Pain in left ankle and joints of left foot  Muscle weakness (generalized)  Difficulty in walking, not elsewhere classified     Problem List Patient Active Problem List   Diagnosis Date Noted  . Tobacco abuse 05/03/2017  . Depression with anxiety 05/09/2013  . Obesity, unspecified  04/07/2013  . Insomnia 04/07/2013  .  Hypothyroidism 04/07/2013  . Essential hypertension 04/04/2013  . Grief reaction 04/04/2013   Bess Harvest, PTA 06/30/17 12:26 PM  [facility] [address]  [place], [place], [address] Phone: [phone]   Fax:  [phone]  Name: Tony Walters MRN: [id number] Date of Birth: 05-05-1975

## 2017-07-03 ENCOUNTER — Ambulatory Visit: Payer: BLUE CROSS/BLUE SHIELD | Admitting: Physical Therapy

## 2017-07-03 ENCOUNTER — Encounter: Payer: Self-pay | Admitting: Physical Therapy

## 2017-07-03 DIAGNOSIS — M6281 Muscle weakness (generalized): Secondary | ICD-10-CM

## 2017-07-03 DIAGNOSIS — M25572 Pain in left ankle and joints of left foot: Secondary | ICD-10-CM | POA: Diagnosis not present

## 2017-07-03 DIAGNOSIS — M25675 Stiffness of left foot, not elsewhere classified: Secondary | ICD-10-CM

## 2017-07-03 DIAGNOSIS — R262 Difficulty in walking, not elsewhere classified: Secondary | ICD-10-CM

## 2017-07-03 NOTE — Therapy (Signed)
[facility] [address]  [place], [place], [address] Phone: [phone]   Fax:  [phone]  Physical Therapy Treatment  Patient Details  Name: Tony Walters MRN: [id number] Date of Birth: 11/19/1975 Referring Provider: Tyson Dense, DPM   Encounter Date: 07/03/2017  PT End of Session - 07/03/17 0856    Visit Number  7    Number of Visits  13    Date for PT Re-Evaluation  07/18/17    Authorization Type  BCBS    PT Start Time  0801    PT Stop Time  6389    PT Time Calculation (min)  56 min    Activity Tolerance  Patient tolerated treatment well    Behavior During Therapy  [facility] for tasks assessed/performed       Past Medical History:  Diagnosis Date  . Chicken pox as a child  . Elevated BP 04/04/2013  . Grief reaction 04/04/2013  . HTN (hypertension) 04/04/2013  . PTSD (post-traumatic stress disorder)   . Unspecified hypothyroidism 04/07/2013    Past Surgical History:  Procedure Laterality Date  . HYDROCELE EXCISION / REPAIR  42 years old  . WISDOM TOOTH EXTRACTION  2010    There were no vitals filed for this visit.  Subjective Assessment - 07/03/17 0803    Subjective  Reports he is starting to get some movement back in his L toe. Still having some swelling and difficulty wearing shoes.     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  Yes    Pain Score  2     Pain Location  Foot 1st digit     Pain Orientation  Left    Pain Descriptors / Indicators  Aching;Discomfort                       [facility] Adult PT Treatment/Exercise - 07/03/17 0001      Vasopneumatic   Number Minutes Vasopneumatic   10 minutes    Vasopnuematic Location   Ankle    Vasopneumatic Pressure  Medium    Vasopneumatic Temperature   Coldest temp.        Manual Therapy   Manual Therapy  Soft tissue mobilization;Joint mobilization    Manual therapy comments  Supine     Joint Mobilization  L 1st MTP distraction and mobs into flex/ex; grade III    Soft tissue mobilization  L plantar fascia, L pos calf    Passive ROM  L DF+great toe stretch with therapist 2x30 sec       Ankle Exercises: Aerobic   Recumbent Bike  Lvl 3, 6 min       Ankle Exercises: Seated   Towel Crunch  5 reps    Other Seated Ankle Exercises  L EV/IV (red TB), DF & PF (blue TB) 2x10 reps       Ankle Exercises: Standing   SLS  L LE on foam; 4x5 cone reaching with opposite LE cone toe-touch without UE support VCs to activate hips and core    Heel Raises  Both;15 reps 2 sets with B UE support from therapist    Other Standing Ankle Exercises  Step-through and back  on foam; 10x on L LE             PT Education - 07/03/17 0856    Education Details  Received blue TB to progress ankle ROM exercises    Person(s) Educated  Patient    Methods  Explanation;Demonstration    Comprehension  Verbalized understanding;Returned demonstration       PT Short Term Goals - 06/28/17 0830      PT SHORT TERM GOAL #1   Title  Patient to be independent with inital HEP.    Time  2    Period  Weeks    Status  Achieved        PT Long Term Goals - 06/28/17 5176      PT LONG TERM GOAL #1   Title  Patient to be independent with advanced HEP.    Time  4    Period  Weeks    Status  On-going      PT LONG TERM GOAL #2   Title  Patient to report tolerance of 1.5 hours of walking without c/o pain.    Time  4    Period  Weeks    Status  On-going      PT LONG TERM GOAL #3   Title  Patient to demonstrate WFL L 1st MTP and ankle AROM to allow for efficient gait pattern.    Time  4    Period  Weeks    Status  On-going      PT LONG TERM GOAL #4   Title  Patient to demonstrate >=4+/5 L LE strength.     Time  4    Period  Weeks    Status  On-going      PT LONG TERM GOAL #5   Title  Patient to demonstrate reciprocal step-through pattern with use of 1 handrail as needed to climb up/down 1 flight of stairs to allow safe access to home.    Time  4    Period  Weeks    Status  Achieved            Plan - 07/03/17 0858    Clinical Impression Statement  Patient arrived to session with report that L 1st MTP movement has been improving, however still having pain and swelling, especially when trying to wear shoe. Tolerated 35 minutes of shoe wear yesterday. Patient tolerated STM to L calf, plantar fascia, and plantar and dorsal incision sites as well as 1st MTP distraction and mobs in order to increase ROM. Patient tolerated manual therapy without c/o pain. Patient able to perform resisted ankle motion with increased TB resistance this date- gave pt blue TB in order ot progress these exercises at home. Patient tolerated double and single leg balance activities with tolerance- VCs required to activate core and hip activation during balance. Received [place] to L foot at end of session; normal integumentary response observed. L 1st MTP ROM progressing well thus far and patient compliant with HEP; will benefit from skilled PT services to progress towards goals.     PT Treatment/Interventions  ADLs/Self Care Home Management;Cryotherapy;Electrical Stimulation;Iontophoresis 4mg /ml Dexamethasone;Moist Heat;Therapeutic exercise;Therapeutic activities;Functional mobility training;Stair training;Gait training;Ultrasound;Balance training;Neuromuscular re-education;Patient/family education;Orthotic Fit/Training;Manual techniques;Vasopneumatic Device;Taping;Splinting;Dry needling;Passive range of motion;Scar mobilization;Compression bandaging    PT Next Visit Plan  progress ankle and MTP ROM    Consulted and Agree with Plan of Care  Patient       Patient will benefit from skilled therapeutic intervention in  order to improve the  following deficits and impairments:  Hypomobility, Impaired sensation, Increased edema, Pain, Decreased strength, Decreased activity tolerance, Decreased mobility, Difficulty walking, Decreased range of motion, Impaired flexibility, Postural dysfunction  Visit Diagnosis: Stiffness of left foot, not elsewhere classified  Pain in left ankle and joints of left foot  Muscle weakness (generalized)  Difficulty in walking, not elsewhere classified     Problem List Patient Active Problem List   Diagnosis Date Noted  . Tobacco abuse 05/03/2017  . Depression with anxiety 05/09/2013  . Obesity, unspecified 04/07/2013  . Insomnia 04/07/2013  . Hypothyroidism 04/07/2013  . Essential hypertension 04/04/2013  . Grief reaction 04/04/2013    Janene Harvey, PT, DPT 07/03/17 9:01 AM   [facility] [address]  [place], [place], [address] Phone: [phone]   Fax:  [phone]  Name: Tony Walters MRN: [id number] Date of Birth: May 14, 1975

## 2017-07-05 ENCOUNTER — Ambulatory Visit: Payer: BLUE CROSS/BLUE SHIELD

## 2017-07-05 DIAGNOSIS — M25675 Stiffness of left foot, not elsewhere classified: Secondary | ICD-10-CM

## 2017-07-05 DIAGNOSIS — M6281 Muscle weakness (generalized): Secondary | ICD-10-CM | POA: Diagnosis not present

## 2017-07-05 DIAGNOSIS — R262 Difficulty in walking, not elsewhere classified: Secondary | ICD-10-CM

## 2017-07-05 DIAGNOSIS — M25572 Pain in left ankle and joints of left foot: Secondary | ICD-10-CM | POA: Diagnosis not present

## 2017-07-05 NOTE — Therapy (Signed)
[facility] [address]  [place], [place], [address] Phone: [phone]   Fax:  [phone]  Physical Therapy Treatment  Patient Details  Name: Tony Walters MRN: [id number] Date of Birth: 18-Aug-1975 Referring Provider: Tyson Dense, DPM   Encounter Date: 07/05/2017  PT End of Session - 07/05/17 0807    Visit Number  8    Number of Visits  13    Date for PT Re-Evaluation  07/18/17    Authorization Type  BCBS    PT Start Time  0803    PT Stop Time  0857    PT Time Calculation (min)  54 min    Activity Tolerance  Patient tolerated treatment well    Behavior During Therapy  [facility] for tasks assessed/performed       Past Medical History:  Diagnosis Date  . Chicken pox as a child  . Elevated BP 04/04/2013  . Grief reaction 04/04/2013  . HTN (hypertension) 04/04/2013  . PTSD (post-traumatic stress disorder)   . Unspecified hypothyroidism 04/07/2013    Past Surgical History:  Procedure Laterality Date  . HYDROCELE EXCISION / REPAIR  42 years old  . WISDOM TOOTH EXTRACTION  2010    There were no vitals filed for this visit.  Subjective Assessment - 07/05/17 0805    Subjective  Pt. noting improved tolerance for wearing tennis shoes today.      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  Yes    Pain Score  2     Pain Location  Foot    Pain Orientation  Left    Pain Descriptors / Indicators  Aching    Pain Type  Acute pain    Pain Onset  More than a month ago    Pain Frequency  Intermittent    Aggravating Factors   Prolonged walking and standing     Multiple Pain Sites  No         [facility] PT Assessment - 07/05/17 0814      AROM   AROM Assessment Site  Ankle    Right/Left Ankle  Right;Left    Right Ankle Dorsiflexion  12     Right Ankle Plantar Flexion  45    Right Ankle Inversion  30    Right Ankle Eversion  25    Left Ankle Dorsiflexion  10    Left Ankle Plantar Flexion  50    Left Ankle Inversion  31    Left Ankle Eversion  25      Strength   Right/Left Ankle  Left;Right    Right Ankle Dorsiflexion  4+/5    Right Ankle Plantar Flexion  4+/5    Left Ankle Dorsiflexion  4+/5    Left Ankle Plantar Flexion  4+/5                   [facility] Adult PT Treatment/Exercise - 07/05/17 0827      Vasopneumatic   Number Minutes Vasopneumatic   10 minutes    Vasopnuematic Location   Ankle    Vasopneumatic Pressure  Medium    Vasopneumatic Temperature   Coldest temp.        Manual Therapy   Manual Therapy  Soft tissue mobilization;Joint  mobilization    Manual therapy comments  Supine     Joint Mobilization  L 1st MTP distraction and mobs into flex/ex; grade III    Soft tissue mobilization  L plantar fascia, L pos calf    Passive ROM  L DF+great toe stretch with therapist 2x30 sec       Ankle Exercises: Supine   Other Supine Ankle Exercises  Hooklying bridge with toes off table and red TB at forefoot 3" x 15 reps     Other Supine Ankle Exercises  Alternating hip flexion+DF with red TB at forefoot x 10 reps       Ankle Exercises: Standing   SLS  L SLS on foam with opposite LE cone toe-touch "clocks" to cones 2 x 30 sec     Balance Beam  Side stepping on blue foam with alternating LE cone nock over/righting 2 x 7 cones     Other Standing Ankle Exercises  BOSU ball (down) R/L "rocking" x 10 reps each way; no UE support; BOSU ball (down) "mini" squat with 5# in UE 2 x 5 resp       Ankle Exercises: Aerobic   Recumbent Bike  Lvl 3, 6 min                PT Short Term Goals - 06/28/17 0830      PT SHORT TERM GOAL #1   Title  Patient to be independent with inital HEP.    Time  2    Period  Weeks    Status  Achieved        PT Long Term Goals - 06/28/17 5093      PT LONG TERM GOAL #1    Title  Patient to be independent with advanced HEP.    Time  4    Period  Weeks    Status  On-going      PT LONG TERM GOAL #2   Title  Patient to report tolerance of 1.5 hours of walking without c/o pain.    Time  4    Period  Weeks    Status  On-going      PT LONG TERM GOAL #3   Title  Patient to demonstrate WFL L 1st MTP and ankle AROM to allow for efficient gait pattern.    Time  4    Period  Weeks    Status  On-going      PT LONG TERM GOAL #4   Title  Patient to demonstrate >=4+/5 L LE strength.    Time  4    Period  Weeks    Status  On-going      PT LONG TERM GOAL #5   Title  Patient to demonstrate reciprocal step-through pattern with use of 1 handrail as needed to climb up/down 1 flight of stairs to allow safe access to home.    Time  4    Period  Weeks    Status  Achieved            Plan - 07/05/17 0807    Clinical Impression Statement  Raquon doing well today noting improvement in tolerance for wearing tennis shoes.  Tolerated progression of SLS activities on compliant surfaces and BOSU ball stability training for L ankle well today.  Continued manual therapy to address limited L great toe-extension for hopeful improvement in this motion.  Pt. demonstrating improvement in ankle ROM with L DF now 10 dg and L ankle strength grossly 4+/5.  Does require some cueing in session for upright posture and proper pacing of activities.  Will continue to progress toward goals.      PT Treatment/Interventions  ADLs/Self Care Home Management;Cryotherapy;Electrical Stimulation;Iontophoresis 4mg /ml Dexamethasone;Moist Heat;Therapeutic exercise;Therapeutic activities;Functional mobility training;Stair training;Gait training;Ultrasound;Balance training;Neuromuscular re-education;Patient/family education;Orthotic Fit/Training;Manual techniques;Vasopneumatic Device;Taping;Splinting;Dry needling;Passive range of motion;Scar mobilization;Compression bandaging    PT Next Visit Plan  Progress  ankle and MTP ROM     Consulted and Agree with Plan of Care  Patient       Patient will benefit from skilled therapeutic intervention in order to improve the following deficits and impairments:  Hypomobility, Impaired sensation, Increased edema, Pain, Decreased strength, Decreased activity tolerance, Decreased mobility, Difficulty walking, Decreased range of motion, Impaired flexibility, Postural dysfunction  Visit Diagnosis: Stiffness of left foot, not elsewhere classified  Pain in left ankle and joints of left foot  Muscle weakness (generalized)  Difficulty in walking, not elsewhere classified     Problem List Patient Active Problem List   Diagnosis Date Noted  . Tobacco abuse 05/03/2017  . Depression with anxiety 05/09/2013  . Obesity, unspecified 04/07/2013  . Insomnia 04/07/2013  . Hypothyroidism 04/07/2013  . Essential hypertension 04/04/2013  . Grief reaction 04/04/2013    Bess Harvest, PTA 07/05/17 9:19 AM   [facility] [address]  [place], [place], [address] Phone: [phone]   Fax:  [phone]  Name: ELIAZAR OLIVAR MRN: [id number] Date of Birth: 04/14/1975

## 2017-07-07 ENCOUNTER — Ambulatory Visit: Payer: BLUE CROSS/BLUE SHIELD

## 2017-07-07 DIAGNOSIS — M25675 Stiffness of left foot, not elsewhere classified: Secondary | ICD-10-CM

## 2017-07-07 DIAGNOSIS — M6281 Muscle weakness (generalized): Secondary | ICD-10-CM

## 2017-07-07 DIAGNOSIS — M25572 Pain in left ankle and joints of left foot: Secondary | ICD-10-CM

## 2017-07-07 DIAGNOSIS — R262 Difficulty in walking, not elsewhere classified: Secondary | ICD-10-CM | POA: Diagnosis not present

## 2017-07-07 NOTE — Therapy (Signed)
[facility] [address]  [place], [place], [address] Phone: [phone]   Fax:  [phone]  Physical Therapy Treatment  Patient Details  Name: Tony Walters MRN: [id number] Date of Birth: April 10, 1975 Referring Provider: Tyson Dense, DPM   Encounter Date: 07/07/2017  PT End of Session - 07/07/17 0810    Visit Number  9    Number of Visits  13    Date for PT Re-Evaluation  07/18/17    Authorization Type  BCBS    PT Start Time  0804    PT Stop Time  0855    PT Time Calculation (min)  51 min    Activity Tolerance  Patient tolerated treatment well    Behavior During Therapy  [facility] for tasks assessed/performed       Past Medical History:  Diagnosis Date  . Chicken pox as a child  . Elevated BP 04/04/2013  . Grief reaction 04/04/2013  . HTN (hypertension) 04/04/2013  . PTSD (post-traumatic stress disorder)   . Unspecified hypothyroidism 04/07/2013    Past Surgical History:  Procedure Laterality Date  . HYDROCELE EXCISION / REPAIR  42 years old  . WISDOM TOOTH EXTRACTION  2010    There were no vitals filed for this visit.  Subjective Assessment - 07/07/17 0807    Subjective  Doing well.      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  Yes    Pain Score  3     Pain Location  Foot arch and ball of foot    Pain Orientation  Left    Pain Descriptors / Indicators  Aching    Pain Type  Acute pain    Pain Onset  More than a month ago    Pain Frequency  Intermittent    Aggravating Factors   prolonged     Multiple Pain Sites  No                       [facility] Adult PT Treatment/Exercise - 07/07/17 0833      Modalities   Modalities  Cryotherapy      Cryotherapy   Number Minutes Cryotherapy  5 Minutes    Cryotherapy Location  -- L plantar surface of L foot     Type of Cryotherapy  Ice massage      Vasopneumatic   Number Minutes Vasopneumatic   10 minutes    Vasopnuematic Location   Ankle    Vasopneumatic Pressure  Medium    Vasopneumatic Temperature   Coldest temp.        Manual Therapy   Manual Therapy  Soft tissue mobilization;Joint mobilization    Manual therapy comments  Supine     Joint Mobilization  L 1st MTP distraction and mobs into flex/ex; grade III    Soft tissue mobilization  L plantar fascia, L pos calf    Passive ROM  L DF+great toe stretch with therapist 2x30 sec       Ankle Exercises: Machines for Strengthening   Cybex Leg Press  B LE's bent and straight leg calf raise 35# x 20 reps each way       Ankle Exercises: Standing   SLS  L SLS on  foam with basketball ball toss 2 x 30 sec     Heel Raises  Both;15 reps at UBE    Other Standing Ankle Exercises  BOSU ball (down) R/L "rocking" x 15 reps each way; no UE support; BOSU ball (down) eyes closed static balance with CGA from therapist 2 x 30 sec       Ankle Exercises: Stretches   Plantar Fascia Stretch  2 reps;30 seconds    Plantar Fascia Stretch Limitations  + great toe extension stretch     Soleus Stretch  30 seconds;2 reps    Gastroc Stretch  2 reps;30 seconds    Gastroc Stretch Limitations  prostretch       Ankle Exercises: Aerobic   Recumbent Bike  Lvl 3, 6 min                PT Short Term Goals - 06/28/17 0830      PT SHORT TERM GOAL #1   Title  Patient to be independent with inital HEP.    Time  2    Period  Weeks    Status  Achieved        PT Long Term Goals - 06/28/17 1027      PT LONG TERM GOAL #1   Title  Patient to be independent with advanced HEP.    Time  4    Period  Weeks    Status  On-going      PT LONG TERM GOAL #2   Title  Patient to report tolerance of 1.5 hours of walking without c/o pain.    Time  4    Period  Weeks    Status  On-going      PT LONG TERM GOAL #3    Title  Patient to demonstrate WFL L 1st MTP and ankle AROM to allow for efficient gait pattern.    Time  4    Period  Weeks    Status  On-going      PT LONG TERM GOAL #4   Title  Patient to demonstrate >=4+/5 L LE strength.    Time  4    Period  Weeks    Status  On-going      PT LONG TERM GOAL #5   Title  Patient to demonstrate reciprocal step-through pattern with use of 1 handrail as needed to climb up/down 1 flight of stairs to allow safe access to home.    Time  4    Period  Weeks    Status  Achieved            Plan - 07/07/17 0810    Clinical Impression Statement  Pt. doing well today.  Is still working on transitioning into walking with "regular shoes" and notes he still has limited tolerance for this, which he attributes to remaining swelling at ankle/foot.  Tolerated addition of eyes closed BOSU balance training well today with session focusing primarily on manual therapy to plantar surface to improve walking tolerance.  Ended session with compression/ice to L ankle/foot to reduce post-exercise swelling and pain.  Pt. progressing well toward goals and to return for 10th visit in [place] at upcoming visit.    PT Treatment/Interventions  ADLs/Self Care Home Management;Cryotherapy;Electrical Stimulation;Iontophoresis 4mg /ml Dexamethasone;Moist Heat;Therapeutic exercise;Therapeutic activities;Functional mobility training;Stair training;Gait training;Ultrasound;Balance training;Neuromuscular re-education;Patient/family education;Orthotic Fit/Training;Manual techniques;Vasopneumatic Device;Taping;Splinting;Dry needling;Passive range of motion;Scar mobilization;Compression bandaging    PT Next Visit Plan  Progress ankle and MTP ROM     Consulted and Agree with Plan  of Care  Patient       Patient will benefit from skilled therapeutic intervention in order to improve the following deficits and impairments:  Hypomobility, Impaired sensation, Increased edema, Pain, Decreased strength,  Decreased activity tolerance, Decreased mobility, Difficulty walking, Decreased range of motion, Impaired flexibility, Postural dysfunction  Visit Diagnosis: Stiffness of left foot, not elsewhere classified  Pain in left ankle and joints of left foot  Muscle weakness (generalized)  Difficulty in walking, not elsewhere classified     Problem List Patient Active Problem List   Diagnosis Date Noted  . Tobacco abuse 05/03/2017  . Depression with anxiety 05/09/2013  . Obesity, unspecified 04/07/2013  . Insomnia 04/07/2013  . Hypothyroidism 04/07/2013  . Essential hypertension 04/04/2013  . Grief reaction 04/04/2013    Bess Harvest, PTA 07/07/17 10:56 AM   [facility] [address]  [place], [place], [address] Phone: [phone]   Fax:  [phone]  Name: Tony Walters MRN: [id number] Date of Birth: Oct 04, 1975

## 2017-07-10 ENCOUNTER — Encounter: Payer: Self-pay | Admitting: Physical Therapy

## 2017-07-10 ENCOUNTER — Ambulatory Visit: Payer: BLUE CROSS/BLUE SHIELD | Admitting: Physical Therapy

## 2017-07-10 DIAGNOSIS — M25572 Pain in left ankle and joints of left foot: Secondary | ICD-10-CM | POA: Diagnosis not present

## 2017-07-10 DIAGNOSIS — M6281 Muscle weakness (generalized): Secondary | ICD-10-CM

## 2017-07-10 DIAGNOSIS — M25675 Stiffness of left foot, not elsewhere classified: Secondary | ICD-10-CM

## 2017-07-10 DIAGNOSIS — R262 Difficulty in walking, not elsewhere classified: Secondary | ICD-10-CM | POA: Diagnosis not present

## 2017-07-10 NOTE — Therapy (Signed)
[facility] [address]  [place], [place], [address] Phone: [phone]   Fax:  [phone]  Physical Therapy Treatment  Patient Details  Name: NOUR SCALISE MRN: [id number] Date of Birth: 10/07/1975 Referring Provider: Tyson Dense, DPM   Encounter Date: 07/10/2017  PT End of Session - 07/10/17 0904    Visit Number  10    Number of Visits  13    Date for PT Re-Evaluation  07/18/17    Authorization Type  BCBS    PT Start Time  0803    PT Stop Time  0900    PT Time Calculation (min)  57 min    Activity Tolerance  Patient tolerated treatment well    Behavior During Therapy  [facility] for tasks assessed/performed       Past Medical History:  Diagnosis Date  . Chicken pox as a child  . Elevated BP 04/04/2013  . Grief reaction 04/04/2013  . HTN (hypertension) 04/04/2013  . PTSD (post-traumatic stress disorder)   . Unspecified hypothyroidism 04/07/2013    Past Surgical History:  Procedure Laterality Date  . HYDROCELE EXCISION / REPAIR  42 years old  . WISDOM TOOTH EXTRACTION  2010    There were no vitals filed for this visit.  Subjective Assessment - 07/10/17 0803    Subjective  Reports he is doing better. Able to tolerate 4 hours of wearing tennis shoe.     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  Yes    Pain Score  2     Pain Location  Foot    Pain Orientation  Left    Pain Descriptors / Indicators  Aching;Throbbing                       [facility] Adult PT Treatment/Exercise - 07/10/17 0001      Exercises   Exercises  Ankle;Knee/Hip      Knee/Hip Exercises: Standing   Heel Raises  Both;1 set;15 reps on foam; CGA    Forward Lunges  Left;1 set;15 reps VC/TCs to keep L heel down    Functional Squat  1  set;15 reps;Limitations    Functional Squat Limitations  VCs to bring hips back and avoid ant trunk lean    SLS with Vectors  L LE SLS reaching to multidirectional cones; 5 min CGA for balance      Vasopneumatic   Number Minutes Vasopneumatic   10 minutes    Vasopnuematic Location   Ankle    Vasopneumatic Pressure  Medium    Vasopneumatic Temperature   Coldest temp.        Manual Therapy   Manual Therapy  Soft tissue mobilization;Joint mobilization    Manual therapy comments  Supine     Joint Mobilization  L 1st MTP distraction and mobs into flex/ex; grade III/IV    Soft tissue mobilization  L plantar fascia, L pos calf; 2x20 sec self STM of plantar fascia with green ball    Passive ROM  L DF+great toe stretch with therapist 2x30 sec       Ankle Exercises: Aerobic   Recumbent Bike  Lvl 3, 6 min       Ankle Exercises: Supine   Other Supine  Ankle Exercises  Alternating hip flexion+DF with red TB at forefoot x 20      Ankle Exercises: Seated   Heel Raises  Both;20 reps B LE MTP ex, DF, MTP flex, PF    Other Seated Ankle Exercises  1/2 kneeling forward lunge L 1st MTP extension stretch with toe on dumbell; 10x5" chair for support             PT Education - 07/10/17 0904    Education provided  Yes    Education Details  Addition to HEP    Person(s) Educated  Patient    Methods  Explanation;Demonstration;Tactile cues;Verbal cues;Handout    Comprehension  Verbalized understanding;Returned demonstration       PT Short Term Goals - 06/28/17 0830      PT SHORT TERM GOAL #1   Title  Patient to be independent with inital HEP.    Time  2    Period  Weeks    Status  Achieved        PT Long Term Goals - 06/28/17 6063      PT LONG TERM GOAL #1   Title  Patient to be independent with advanced HEP.    Time  4    Period  Weeks    Status  On-going      PT LONG TERM GOAL #2   Title  Patient to report tolerance of 1.5 hours of walking without c/o pain.    Time  4    Period   Weeks    Status  On-going      PT LONG TERM GOAL #3   Title  Patient to demonstrate WFL L 1st MTP and ankle AROM to allow for efficient gait pattern.    Time  4    Period  Weeks    Status  On-going      PT LONG TERM GOAL #4   Title  Patient to demonstrate >=4+/5 L LE strength.    Time  4    Period  Weeks    Status  On-going      PT LONG TERM GOAL #5   Title  Patient to demonstrate reciprocal step-through pattern with use of 1 handrail as needed to climb up/down 1 flight of stairs to allow safe access to home.    Time  4    Period  Weeks    Status  Achieved            Plan - 07/10/17 0904    Clinical Impression Statement  Patient arrived to session with report that L great toe is feeling better today, able to tolerate 4 hours of wearing a tennis shoe at this time. Patient still with significant soft tissue restriction in plantar aspect of L LE; TTP in this area but tolerable. Tolerated self-STM to L plantar fascia with ball to tolerance. Patient performed half kneeling lunge into L MTP extension/ankle DF to increase MTP ROM. Also performed standing squats and lunges with CGA for balance; VC/TCs required to keep L heel down to encourage ankle ROM. Patient educated on and received HEP handout for squats and lunges with UE support; reported understanding. Received [place] to L foot at end of session- normal integumentary response observed.     PT Treatment/Interventions  ADLs/Self Care Home Management;Cryotherapy;Electrical Stimulation;Iontophoresis 4mg /ml Dexamethasone;Moist Heat;Therapeutic exercise;Therapeutic activities;Functional mobility training;Stair training;Gait training;Ultrasound;Balance training;Neuromuscular re-education;Patient/family education;Orthotic Fit/Training;Manual techniques;Vasopneumatic Device;Taping;Splinting;Dry needling;Passive range of motion;Scar mobilization;Compression bandaging    Consulted and Agree with Plan of Care  Patient  Patient will  benefit from skilled therapeutic intervention in order to improve the following deficits and impairments:  Hypomobility, Impaired sensation, Increased edema, Pain, Decreased strength, Decreased activity tolerance, Decreased mobility, Difficulty walking, Decreased range of motion, Impaired flexibility, Postural dysfunction  Visit Diagnosis: Stiffness of left foot, not elsewhere classified  Pain in left ankle and joints of left foot  Muscle weakness (generalized)  Difficulty in walking, not elsewhere classified     Problem List Patient Active Problem List   Diagnosis Date Noted  . Tobacco abuse 05/03/2017  . Depression with anxiety 05/09/2013  . Obesity, unspecified 04/07/2013  . Insomnia 04/07/2013  . Hypothyroidism 04/07/2013  . Essential hypertension 04/04/2013  . Grief reaction 04/04/2013    Janene Harvey, PT, DPT 07/10/17 9:08 AM   [facility] [address]  [place], [place], [address] Phone: [phone]   Fax:  [phone]  Name: JULIAN MEDINA MRN: [id number] Date of Birth: [birth year]

## 2017-07-12 ENCOUNTER — Ambulatory Visit: Payer: BLUE CROSS/BLUE SHIELD

## 2017-07-14 ENCOUNTER — Encounter: Payer: Self-pay | Admitting: Physical Therapy

## 2017-07-14 ENCOUNTER — Ambulatory Visit: Payer: BLUE CROSS/BLUE SHIELD | Admitting: Physical Therapy

## 2017-07-14 DIAGNOSIS — M25675 Stiffness of left foot, not elsewhere classified: Secondary | ICD-10-CM

## 2017-07-14 DIAGNOSIS — M25572 Pain in left ankle and joints of left foot: Secondary | ICD-10-CM

## 2017-07-14 DIAGNOSIS — M6281 Muscle weakness (generalized): Secondary | ICD-10-CM | POA: Diagnosis not present

## 2017-07-14 DIAGNOSIS — R262 Difficulty in walking, not elsewhere classified: Secondary | ICD-10-CM | POA: Diagnosis not present

## 2017-07-14 NOTE — Therapy (Signed)
[facility] [address]  [place], [place], [address] Phone: [phone]   Fax:  [phone]  Physical Therapy Treatment  Patient Details  Name: Tony Walters MRN: [id number] Date of Birth: Jun 07, 1975 Referring Provider: Tyson Dense, DPM   Encounter Date: 07/14/2017  PT End of Session - 07/14/17 0944    Visit Number  11    Number of Visits  19    Date for PT Re-Evaluation  08/11/17    Authorization Type  BCBS    PT Start Time  0804    PT Stop Time  0856    PT Time Calculation (min)  52 min    Equipment Utilized During Treatment  Gait belt    Activity Tolerance  Patient tolerated treatment well    Behavior During Therapy  [facility] for tasks assessed/performed       Past Medical History:  Diagnosis Date  . Chicken pox as a child  . Elevated BP 04/04/2013  . Grief reaction 04/04/2013  . HTN (hypertension) 04/04/2013  . PTSD (post-traumatic stress disorder)   . Unspecified hypothyroidism 04/07/2013    Past Surgical History:  Procedure Laterality Date  . HYDROCELE EXCISION / REPAIR  42 years old  . WISDOM TOOTH EXTRACTION  2010    There were no vitals filed for this visit.  Subjective Assessment - 07/14/17 0803    Subjective  Reports he is walking better and the exercises are working. 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 Is worried about going back to work and hurting himself. Reports 65% improvement since initial eval.    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  No/denies         [facility] PT Assessment - 07/14/17 0001      AROM   AROM Assessment Site  Ankle    Right/Left Ankle  Left    Left Ankle Dorsiflexion  13 L MTP flexion 45, extension 30    Left Ankle Plantar Flexion  40      PROM    PROM Assessment Site  Ankle    Right/Left Ankle  Left    Left Ankle Dorsiflexion  -- L MTP flexion 55, extension 60      Strength   Strength Assessment Site  Hip;Knee;Ankle    Right/Left Hip  Right;Left    Right Hip Flexion  4+/5    Right Hip ABduction  4+/5    Right Hip ADduction  4+/5    Left Hip Flexion  4+/5    Left Hip ABduction  4+/5    Left Hip ADduction  4+/5    Right Knee Flexion  4/5    Right Knee Extension  4+/5    Left Knee Flexion  4/5    Left Knee Extension  4+/5    Right Ankle Dorsiflexion  4+/5    Right Ankle Plantar Flexion  4+/5    Left Ankle Dorsiflexion  4+/5    Left Ankle Plantar Flexion  4+/5      Ambulation/Gait   Stairs  Yes    Stairs Assistance  7: Independent    Stairs Assistance Details (indicate cue type and reason)  No rail    Number of Stairs  13  Height of Stairs  8    Gait Comments  Able to ascend/descend stairs without report of pain and with good eccentric loweing                   [facility] Adult PT Treatment/Exercise - 07/14/17 0001      Knee/Hip Exercises: Aerobic   Stationary Bike  L3x6 min      Vasopneumatic   Number Minutes Vasopneumatic   10 minutes    Vasopnuematic Location   Ankle    Vasopneumatic Pressure  Medium    Vasopneumatic Temperature   Coldest temp.        Manual Therapy   Manual Therapy  Soft tissue mobilization;Joint mobilization    Manual therapy comments  Supine     Joint Mobilization  L 1st MTP distraction and mobs into flex/ex; grade III/IV    Soft tissue mobilization  L plantar fascia, L pos calf; 2x20 sec self STM of plantar fascia with green ball    Passive ROM  L DF+great toe stretch with therapist 2x30 sec       Ankle Exercises: Seated   Heel Raises  Both;20 reps;Limitations B LE MTP ex, DF, MTP flex, PF    Other Seated Ankle Exercises  1/2 kneeling forward lunge L 1st MTP extension stretch with toe on dumbell; 10x5" chair for support      Ankle Exercises: Standing   SLS  L SLS on foam  with SKTC 10x3"    Heel Raises  Both;15 reps CGA               PT Short Term Goals - 07/14/17 0947      PT SHORT TERM GOAL #1   Title  Patient to be independent with inital HEP.    Time  2    Period  Weeks    Status  Achieved        PT Long Term Goals - 07/14/17 0947      PT LONG TERM GOAL #1   Title  Patient to be independent with advanced HEP.    Time  4    Period  Weeks    Status  Partially Met met for current    Target Date  08/11/17      PT LONG TERM GOAL #2   Title  Patient to report tolerance of 1.5 hours of walking without c/o pain.    Time  4    Period  Weeks    Status  Achieved reports 1.5-2 hours of standing/walking without pain      PT LONG TERM GOAL #3   Title  Patient to demonstrate WFL L 1st MTP and ankle AROM to allow for efficient gait pattern.    Time  4    Period  Weeks    Status  On-going L MTP 30 extension, 45 flexion    Target Date  08/11/17      PT LONG TERM GOAL #4   Title  Patient to demonstrate >=4+/5 L LE strength.    Time  4    Period  Weeks    Status  Partially Met limited in HS strength    Target Date  08/11/17      PT LONG TERM GOAL #5   Title  Patient to demonstrate reciprocal step-through pattern with use of 1 handrail as needed to climb up/down 1 flight of stairs to allow safe access to home.    Time  4    Period  Weeks  Status  Achieved      Additional Long Term Goals   Additional Long Term Goals  Yes      PT LONG TERM GOAL #6   Title  Patient to report tolerance of wearing work shoe on L foot for 5 hours with <=2/10 pain.     Time  4    Period  Weeks    Status  New    Target Date  08/11/17      PT LONG TERM GOAL #7   Title  Patient to tolerate SLS on L LE for 15 sec without LOB.    Time  4    Period  Weeks    Status  New    Target Date  08/11/17            Plan - 07/14/17 0946    Clinical Impression Statement  Patient arrived to session with report of 65% improvement in L foot since initial eval.  Able to tolerate 1.5-2 hours of walking before onset of pain, but still unable to tolerate lacing up his "work shoes" d/t swelling and discomfort. Patient also reported difficulty balancing on L LE while showering. Updated goals this date- patient met walking and stair climbing goals and has shown significant improvements in L 1st MTP AROM/PROM and L ankle dorsiflexion. Still shows room for improvement in L MTP and PF AROM, HS strength, and soft tissue extensibility of L plantar fascia. Tolerated STM to L plantar fascia and gastroc- patient still with considerable soft tissue restriction and tenderness in plantar surface of foot. Able to perform standing balance exercises on foam with CGA- moderate instability of B ankles observed. Received [place] to L foot at end of session; normal integumentary response observed. Patient will benefit from continued PT services 2x/week for 4 weeks to address aforementioned impairments with hopeful return to work.     PT Treatment/Interventions  ADLs/Self Care Home Management;Cryotherapy;Electrical Stimulation;Iontophoresis 55m/ml Dexamethasone;Moist Heat;Therapeutic exercise;Therapeutic activities;Functional mobility training;Stair training;Gait training;Ultrasound;Balance training;Neuromuscular re-education;Patient/family education;Orthotic Fit/Training;Manual techniques;Vasopneumatic Device;Taping;Splinting;Dry needling;Passive range of motion;Scar mobilization;Compression bandaging    PT Next Visit Plan  progress MTP and ankle ROM, balance exercises    Consulted and Agree with Plan of Care  Patient       Patient will benefit from skilled therapeutic intervention in order to improve the following deficits and impairments:  Hypomobility, Impaired sensation, Increased edema, Pain, Decreased strength, Decreased activity tolerance, Decreased mobility, Difficulty walking, Decreased range of motion, Impaired flexibility, Postural dysfunction  Visit Diagnosis: Stiffness of  left foot, not elsewhere classified  Pain in left ankle and joints of left foot  Muscle weakness (generalized)  Difficulty in walking, not elsewhere classified     Problem List Patient Active Problem List   Diagnosis Date Noted  . Tobacco abuse 05/03/2017  . Depression with anxiety 05/09/2013  . Obesity, unspecified 04/07/2013  . Insomnia 04/07/2013  . Hypothyroidism 04/07/2013  . Essential hypertension 04/04/2013  . Grief reaction 04/04/2013     YJanene Harvey PT, DPT 07/14/17 9:56 AM   [facility] [place] [address]Phone: [phone]  Fax:  [phone] Name: Tony LANNIMRN: [id number] of Birth: 1Jul 05, 1977

## 2017-07-18 ENCOUNTER — Ambulatory Visit: Payer: BLUE CROSS/BLUE SHIELD

## 2017-07-18 DIAGNOSIS — M6281 Muscle weakness (generalized): Secondary | ICD-10-CM | POA: Diagnosis not present

## 2017-07-18 DIAGNOSIS — M25675 Stiffness of left foot, not elsewhere classified: Secondary | ICD-10-CM | POA: Diagnosis not present

## 2017-07-18 DIAGNOSIS — R262 Difficulty in walking, not elsewhere classified: Secondary | ICD-10-CM

## 2017-07-18 DIAGNOSIS — M25572 Pain in left ankle and joints of left foot: Secondary | ICD-10-CM

## 2017-07-18 NOTE — Therapy (Signed)
[facility] [address]  [place], [place], [address] Phone: [phone]   Fax:  [phone]  Physical Therapy Treatment  Patient Details  Name: Tony Walters MRN: [id number] Date of Birth: 08-04-1975 Referring Provider: Tyson Dense, DPM   Encounter Date: 07/18/2017  PT End of Session - 07/18/17 0908    Visit Number  12    Number of Visits  19    Date for PT Re-Evaluation  08/11/17    Authorization Type  BCBS    PT Start Time  0847    PT Stop Time  0928    PT Time Calculation (min)  41 min    Equipment Utilized During Treatment  Gait belt    Activity Tolerance  Patient tolerated treatment well    Behavior During Therapy  [facility] for tasks assessed/performed       Past Medical History:  Diagnosis Date  . Chicken pox as a child  . Elevated BP 04/04/2013  . Grief reaction 04/04/2013  . HTN (hypertension) 04/04/2013  . PTSD (post-traumatic stress disorder)   . Unspecified hypothyroidism 04/07/2013    Past Surgical History:  Procedure Laterality Date  . HYDROCELE EXCISION / REPAIR  42 years old  . WISDOM TOOTH EXTRACTION  2010    There were no vitals filed for this visit.  Subjective Assessment - 07/18/17 0911    Subjective  Pt. noting some improvement in walking and standing tolerance for "regular" shoes.  Still noting he cannot tolerate, "work shoes".      Limitations  House hold activities;Walking;Standing    How long can you sit comfortably?  not limited     How long can you stand comfortably?  2 hours     How long can you walk comfortably?  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  No/denies    Pain Score  0-No pain    Multiple Pain Sites  No                       [facility] Adult PT  Treatment/Exercise - 07/18/17 9373      Neuro Re-ed    Neuro Re-ed Details   Forward/back tandem walk on blue foam balance beam at counter; intermittent UE support on backwards walk x 10 laps       Knee/Hip Exercises: Machines for Strengthening   Cybex Knee Flexion  B con/L ecc 35# 2 x 10 reps       Knee/Hip Exercises: Standing   Rebounder  B UE med ball (1000gr) toss to rebounder wiht SLS on foam in varying angles 3 x 30 sec; good stability       Knee/Hip Exercises: Supine   Other Supine Knee/Hip Exercises  HS curl + bridge with heels on peanut p-ball  x 10 reps       Manual Therapy   Manual Therapy  Soft tissue mobilization;Joint mobilization    Manual therapy comments  Supine     Joint Mobilization  L 1st MTP distraction and mobs into flex/ex; grade III/IV    Soft tissue mobilization  L plantar fascia     Passive ROM  L DF+great toe stretch with therapist 2x30 sec  Ankle Exercises: Aerobic   Recumbent Bike  Lvl 3, 6 min       Ankle Exercises: Machines for Strengthening   Cybex Leg Press  B LE's bent and straight leg calf raise 35# x 20 reps each way       Ankle Exercises: Standing   SLS  L SLS on floor 3 x 15" without UE support and good overall stability     Heel Raises  Both;20 reps at UBE      Ankle Exercises: Stretches   Gastroc Stretch  2 reps;30 seconds    Gastroc Stretch Limitations  prostretch                PT Short Term Goals - 07/14/17 0947      PT SHORT TERM GOAL #1   Title  Patient to be independent with inital HEP.    Time  2    Period  Weeks    Status  Achieved        PT Long Term Goals - 07/18/17 0913      PT LONG TERM GOAL #1   Title  Patient to be independent with advanced HEP.    Time  4    Period  Weeks    Status  Partially Met met for current      PT LONG TERM GOAL #2   Title  Patient to report tolerance of 1.5 hours of walking without c/o pain.    Time  4    Period  Weeks    Status  Achieved reports 1.5-2 hours of  standing/walking without pain      PT LONG TERM GOAL #3   Title  Patient to demonstrate WFL L 1st MTP and ankle AROM to allow for efficient gait pattern.    Time  4    Period  Weeks    Status  Partially Met L MTP 30 extension, 45 flexion      PT LONG TERM GOAL #4   Title  Patient to demonstrate >=4+/5 L LE strength.    Time  4    Period  Weeks    Status  Partially Met limited in HS strength      PT LONG TERM GOAL #5   Title  Patient to demonstrate reciprocal step-through pattern with use of 1 handrail as needed to climb up/down 1 flight of stairs to allow safe access to home.    Time  4    Period  Weeks    Status  Achieved      PT LONG TERM GOAL #6   Title  Patient to report tolerance of wearing work shoe on L foot for 5 hours with <=2/10 pain.     Time  4    Period  Weeks    Status  On-going      PT LONG TERM GOAL #7   Title  Patient to tolerate SLS on L LE for 15 sec without LOB.    Time  4    Period  Weeks    Status  Achieved            Plan - 07/18/17 0909    Clinical Impression Statement  Pt. noting he is able to walk or stand for ~ 2 hours before onset of L foot pain at this point.  Able to tolerance standing/walking in "normal" shoes however still noting very limited tolerance for ambulating/standing in "work" tennis shoes.  Pt. able to demo grossly 4+/5 L ankle LE  strength with MMT performed last visit and ankle AROM nearly [facility] still limited most with great toe ext./flexion ROM.  Progressing well with SLS balance training on compliant surface at this point.  Primary concern and limitation remains prolonged walking and standing tolerance in "work" tennis shoes with pt. noting pain at worst rising to 6/10.  Pt. to see MD later this week for f/u.  Will continue to progress toward goals.      PT Treatment/Interventions  ADLs/Self Care Home Management;Cryotherapy;Electrical Stimulation;Iontophoresis 49m/ml Dexamethasone;Moist Heat;Therapeutic exercise;Therapeutic  activities;Functional mobility training;Stair training;Gait training;Ultrasound;Balance training;Neuromuscular re-education;Patient/family education;Orthotic Fit/Training;Manual techniques;Vasopneumatic Device;Taping;Splinting;Dry needling;Passive range of motion;Scar mobilization;Compression bandaging    PT Next Visit Plan  progress MTP and ankle ROM, balance exercises    Consulted and Agree with Plan of Care  Patient       Patient will benefit from skilled therapeutic intervention in order to improve the following deficits and impairments:  Hypomobility, Impaired sensation, Increased edema, Pain, Decreased strength, Decreased activity tolerance, Decreased mobility, Difficulty walking, Decreased range of motion, Impaired flexibility, Postural dysfunction  Visit Diagnosis: Stiffness of left foot, not elsewhere classified  Pain in left ankle and joints of left foot  Muscle weakness (generalized)  Difficulty in walking, not elsewhere classified     Problem List Patient Active Problem List   Diagnosis Date Noted  . Tobacco abuse 05/03/2017  . Depression with anxiety 05/09/2013  . Obesity, unspecified 04/07/2013  . Insomnia 04/07/2013  . Hypothyroidism 04/07/2013  . Essential hypertension 04/04/2013  . Grief reaction 04/04/2013    MBess Harvest PTA 07/18/17 12:29 PM   [facility] [address] [place] [address]Phone: [phone]  Fax:  [phone] Name: BVRAJ DENARDOMRN: [id number] of Birth: 101/19/1977

## 2017-07-20 ENCOUNTER — Encounter: Payer: Self-pay | Admitting: Podiatry

## 2017-07-20 ENCOUNTER — Ambulatory Visit (INDEPENDENT_AMBULATORY_CARE_PROVIDER_SITE_OTHER): Payer: BLUE CROSS/BLUE SHIELD

## 2017-07-20 ENCOUNTER — Ambulatory Visit (INDEPENDENT_AMBULATORY_CARE_PROVIDER_SITE_OTHER): Payer: BLUE CROSS/BLUE SHIELD | Admitting: Podiatry

## 2017-07-20 ENCOUNTER — Ambulatory Visit: Payer: BLUE CROSS/BLUE SHIELD

## 2017-07-20 DIAGNOSIS — Z9889 Other specified postprocedural states: Secondary | ICD-10-CM

## 2017-07-20 DIAGNOSIS — M2012 Hallux valgus (acquired), left foot: Secondary | ICD-10-CM | POA: Diagnosis not present

## 2017-07-20 DIAGNOSIS — M722 Plantar fascial fibromatosis: Secondary | ICD-10-CM

## 2017-07-20 NOTE — Progress Notes (Signed)
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  States that is better but is still trouble getting into my work boots and I really cannot stand for more than about 6 hours in a day.  He states that have to work 12-hour shifts at work.  States that physical therapy recommended another month.  Denies any calf pain back pain chest pain shortness of breath fever chills nausea vomiting.  Objective: Vital signs are stable alert and oriented x3.  Pulses are palpable.  Neurologic sensorium is intact.  Deep tendon reflexes are intact muscle strength is normal symmetrical.  He has good great range of motion of the first metatarsophalangeal joint of the left foot.  He has some mild thickening of what appears to be scar tissue or possibly even redevelopment of a fibroma the proximal most aspect of the scar.  Assessment: Well-healing surgical foot.  Plan: I recommended highly that he continue physical therapy for another month and I will follow-up with him at that time hopefully we will be able to get him back to work at that time.

## 2017-07-21 ENCOUNTER — Ambulatory Visit: Payer: BLUE CROSS/BLUE SHIELD

## 2017-07-21 DIAGNOSIS — M25572 Pain in left ankle and joints of left foot: Secondary | ICD-10-CM | POA: Diagnosis not present

## 2017-07-21 DIAGNOSIS — M25675 Stiffness of left foot, not elsewhere classified: Secondary | ICD-10-CM | POA: Diagnosis not present

## 2017-07-21 DIAGNOSIS — R262 Difficulty in walking, not elsewhere classified: Secondary | ICD-10-CM

## 2017-07-21 DIAGNOSIS — M6281 Muscle weakness (generalized): Secondary | ICD-10-CM | POA: Diagnosis not present

## 2017-07-21 NOTE — Therapy (Signed)
[facility] [address]  [place], [place], [address] Phone: [phone]   Fax:  [phone]  Physical Therapy Treatment  Patient Details  Name: Tony Walters MRN: [id number] Date of Birth: 1975/02/05 Referring Provider: Tyson Dense, DPM   Encounter Date: 07/21/2017  PT End of Session - 07/21/17 1041    Visit Number  13    Number of Visits  19    Date for PT Re-Evaluation  08/11/17    Authorization Type  BCBS    PT Start Time  0845    PT Stop Time  0940    PT Time Calculation (min)  55 min    Equipment Utilized During Treatment  --    Activity Tolerance  Patient tolerated treatment well    Behavior During Therapy  [facility] for tasks assessed/performed       Past Medical History:  Diagnosis Date  . Chicken pox as a child  . Elevated BP 04/04/2013  . Grief reaction 04/04/2013  . HTN (hypertension) 04/04/2013  . PTSD (post-traumatic stress disorder)   . Unspecified hypothyroidism 04/07/2013    Past Surgical History:  Procedure Laterality Date  . HYDROCELE EXCISION / REPAIR  42 years old  . WISDOM TOOTH EXTRACTION  2010    There were no vitals filed for this visit.  Subjective Assessment - 07/21/17 1039    Subjective  Pt. noting saw MD yesterday.      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  No/denies    Pain Score  0-No pain    Multiple Pain Sites  No                       [facility] Adult PT Treatment/Exercise - 07/21/17 0908      Neuro Re-ed    Neuro Re-ed Details   B tandem balance on foam without shoes or UE support 2 x 30 sec; Narrow stance balance on foam without shoes with R/L, up/down head turns 2 x 30 sec each way;   Supervision/CGA provided from therapist throughout      Modalities   Modalities   Ultrasound      Ultrasound   Ultrasound Location  plantar surface of foot at sight of incision     Ultrasound Parameters  1.0Hz , 1.0wt/cm2, 8'    Ultrasound Goals  Other (Comment)      Vasopneumatic   Number Minutes Vasopneumatic   10 minutes    Vasopnuematic Location   Ankle    Vasopneumatic Pressure  Medium    Vasopneumatic Temperature   Coldest temp.        Manual Therapy   Manual Therapy  Soft tissue mobilization;Passive ROM    Manual therapy comments  Prone     Joint Mobilization  L 1st MTP distraction and mobs into flex/ex; grade III/IV    Soft tissue mobilization  STM/DTM to L plantar incision     Passive ROM  L DF+great toe ext. stretch with therapist 2x30 sec       Ankle Exercises: Aerobic   Recumbent Bike  Lvl 3, 6 min       Ankle Exercises: Seated   Towel Crunch  5 reps with big towel  PT Short Term Goals - 07/14/17 0947      PT SHORT TERM GOAL #1   Title  Patient to be independent with inital HEP.    Time  2    Period  Weeks    Status  Achieved        PT Long Term Goals - 07/18/17 0913      PT LONG TERM GOAL #1   Title  Patient to be independent with advanced HEP.    Time  4    Period  Weeks    Status  Partially Met met for current      PT LONG TERM GOAL #2   Title  Patient to report tolerance of 1.5 hours of walking without c/o pain.    Time  4    Period  Weeks    Status  Achieved reports 1.5-2 hours of standing/walking without pain      PT LONG TERM GOAL #3   Title  Patient to demonstrate WFL L 1st MTP and ankle AROM to allow for efficient gait pattern.    Time  4    Period  Weeks    Status  Partially Met L MTP 30 extension, 45 flexion      PT LONG TERM GOAL #4   Title  Patient to demonstrate >=4+/5 L LE strength.    Time  4    Period  Weeks    Status  Partially Met limited in HS strength      PT LONG TERM GOAL #5   Title  Patient to demonstrate reciprocal step-through pattern with use of 1 handrail as needed to  climb up/down 1 flight of stairs to allow safe access to home.    Time  4    Period  Weeks    Status  Achieved      PT LONG TERM GOAL #6   Title  Patient to report tolerance of wearing work shoe on L foot for 5 hours with <=2/10 pain.     Time  4    Period  Weeks    Status  On-going      PT LONG TERM GOAL #7   Title  Patient to tolerate SLS on L LE for 15 sec without LOB.    Time  4    Period  Weeks    Status  Achieved            Plan - 07/21/17 1050    Clinical Impression Statement  Ginnie Smart saw MD yesterday with MD wanting pt. to receive ultrasound in therapy for plantar surface of foot along with mention from pt. of possible Phonophoresis.  Session focusing on ultrasound to L plantar surface of foot at sight of incision/scar tissue buildup and intrinsic foot musculature training with barefoot proprioception training.  Pt. tolerated all activities well and with most difficulty with eyes closed standing head turns, in narrow stance, on foam today.    PT Treatment/Interventions  ADLs/Self Care Home Management;Cryotherapy;Electrical Stimulation;Iontophoresis 26m/ml Dexamethasone;Moist Heat;Therapeutic exercise;Therapeutic activities;Functional mobility training;Stair training;Gait training;Ultrasound;Balance training;Neuromuscular re-education;Patient/family education;Orthotic Fit/Training;Manual techniques;Vasopneumatic Device;Taping;Splinting;Dry needling;Passive range of motion;Scar mobilization;Compression bandaging    PT Next Visit Plan  Further training barefoot balance; progress MTP and ankle ROM, balance exercises    Consulted and Agree with Plan of Care  Patient       Patient will benefit from skilled therapeutic intervention in order to improve the following deficits and impairments:  Hypomobility, Impaired sensation, Increased edema, Pain, Decreased strength, Decreased activity tolerance, Decreased  mobility, Difficulty walking, Decreased range of motion, Impaired flexibility,  Postural dysfunction  Visit Diagnosis: Stiffness of left foot, not elsewhere classified  Pain in left ankle and joints of left foot  Muscle weakness (generalized)  Difficulty in walking, not elsewhere classified     Problem List Patient Active Problem List   Diagnosis Date Noted  . Tobacco abuse 05/03/2017  . Depression with anxiety 05/09/2013  . Obesity, unspecified 04/07/2013  . Insomnia 04/07/2013  . Hypothyroidism 04/07/2013  . Essential hypertension 04/04/2013  . Grief reaction 04/04/2013    Bess Harvest, PTA 07/21/17 10:57 AM   [facility] [address]  [place], [place], [address] Phone: [phone]   Fax:  [phone]  Name: Tony Walters MRN: [id number] Date of Birth: 1975/06/27

## 2017-07-25 ENCOUNTER — Ambulatory Visit: Payer: BLUE CROSS/BLUE SHIELD | Attending: Podiatry

## 2017-07-25 DIAGNOSIS — M25572 Pain in left ankle and joints of left foot: Secondary | ICD-10-CM | POA: Diagnosis not present

## 2017-07-25 DIAGNOSIS — M25675 Stiffness of left foot, not elsewhere classified: Secondary | ICD-10-CM | POA: Diagnosis not present

## 2017-07-25 DIAGNOSIS — R262 Difficulty in walking, not elsewhere classified: Secondary | ICD-10-CM | POA: Diagnosis not present

## 2017-07-25 DIAGNOSIS — M6281 Muscle weakness (generalized): Secondary | ICD-10-CM | POA: Diagnosis not present

## 2017-07-25 NOTE — Therapy (Signed)
[facility] [address]  [place], [place], [address] Phone: [phone]   Fax:  [phone]  Physical Therapy Treatment  Patient Details  Name: Tony Walters MRN: [id number] Date of Birth: December 20, 1975 Referring Provider: Tyson Dense, DPM   Encounter Date: 07/25/2017  PT End of Session - 07/25/17 0808    Visit Number  14    Number of Visits  19    Date for PT Re-Evaluation  08/11/17    Authorization Type  BCBS    PT Start Time  0801    PT Stop Time  0843    PT Time Calculation (min)  42 min    Activity Tolerance  Patient tolerated treatment well    Behavior During Therapy  [facility] for tasks assessed/performed       Past Medical History:  Diagnosis Date  . Chicken pox as a child  . Elevated BP 04/04/2013  . Grief reaction 04/04/2013  . HTN (hypertension) 04/04/2013  . PTSD (post-traumatic stress disorder)   . Unspecified hypothyroidism 04/07/2013    Past Surgical History:  Procedure Laterality Date  . HYDROCELE EXCISION / REPAIR  42 years old  . WISDOM TOOTH EXTRACTION  2010    There were no vitals filed for this visit.  Subjective Assessment - 07/25/17 0804    Subjective  Pt. noting improved tolerance for wearing work shoes.      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  No/denies    Pain Score  0-No pain    Multiple Pain Sites  No         [facility] PT Assessment - 07/25/17 0825      Assessment   Next MD Visit  6.25.19                   [facility] Adult PT Treatment/Exercise - 07/25/17 9798      Neuro Re-ed    Neuro Re-ed Details   tandem forward/backwards walk on foam beam x 8 laps; side stepping with yellow TB at forefoot on foam beam x 8 laps       Knee/Hip Exercises: Stretches   Gastroc Stretch  Left;2  reps;30 seconds    Gastroc Stretch Limitations  at wall + great toe stretch       Knee/Hip Exercises: Aerobic   Stationary Bike  L3 x 3 min    Elliptical  Lvl 1.0, 3 min  well tolerated       Knee/Hip Exercises: Machines for Strengthening   Cybex Knee Flexion  L only 25# x 20 reps; toes off platform     Cybex Leg Press  --      Ultrasound   Ultrasound Location  plantar surface of foot at sight of incision     Ultrasound Parameters  1.0MHz, 1.0watts/cm2, 8'    Ultrasound Goals  Other (Comment)      Ankle Exercises: Machines for Strengthening   Cybex Leg Press  Bent and straight leg calf raise 35# x 20 reps each way       Ankle Exercises: Standing   Other Standing Ankle Exercises  TRX lunge with L foot back x 10 reps  PT Short Term Goals - 07/14/17 0947      PT SHORT TERM GOAL #1   Title  Patient to be independent with inital HEP.    Time  2    Period  Weeks    Status  Achieved        PT Long Term Goals - 07/18/17 0913      PT LONG TERM GOAL #1   Title  Patient to be independent with advanced HEP.    Time  4    Period  Weeks    Status  Partially Met met for current      PT LONG TERM GOAL #2   Title  Patient to report tolerance of 1.5 hours of walking without c/o pain.    Time  4    Period  Weeks    Status  Achieved reports 1.5-2 hours of standing/walking without pain      PT LONG TERM GOAL #3   Title  Patient to demonstrate WFL L 1st MTP and ankle AROM to allow for efficient gait pattern.    Time  4    Period  Weeks    Status  Partially Met L MTP 30 extension, 45 flexion      PT LONG TERM GOAL #4   Title  Patient to demonstrate >=4+/5 L LE strength.    Time  4    Period  Weeks    Status  Partially Met limited in HS strength      PT LONG TERM GOAL #5   Title  Patient to demonstrate reciprocal step-through pattern with use of 1 handrail as needed to climb up/down 1 flight of stairs to allow safe access to home.    Time  4    Period   Weeks    Status  Achieved      PT LONG TERM GOAL #6   Title  Patient to report tolerance of wearing work shoe on L foot for 5 hours with <=2/10 pain.     Time  4    Period  Weeks    Status  On-going      PT LONG TERM GOAL #7   Title  Patient to tolerate SLS on L LE for 15 sec without LOB.    Time  4    Period  Weeks    Status  Achieved            Plan - 07/25/17 0824    Clinical Impression Statement  Reports he is tolerating wearing work shoes better today.  Scheduled to return to work on 6.28.19.  Session focused on therapeutic ultrasound to L plantar surface at sight of scar tissue/incision and intrinsic foot training on compliant surface.  Activities well tolerated in today's session with addition of TRX lunge.  Pt. progressing well toward goals.      PT Treatment/Interventions  ADLs/Self Care Home Management;Cryotherapy;Electrical Stimulation;Iontophoresis '4mg'$ /ml Dexamethasone;Moist Heat;Therapeutic exercise;Therapeutic activities;Functional mobility training;Stair training;Gait training;Ultrasound;Balance training;Neuromuscular re-education;Patient/family education;Orthotic Fit/Training;Manual techniques;Vasopneumatic Device;Taping;Splinting;Dry needling;Passive range of motion;Scar mobilization;Compression bandaging    PT Next Visit Plan  Further training barefoot balance; progress MTP and ankle ROM, balance exercises    Consulted and Agree with Plan of Care  Patient       Patient will benefit from skilled therapeutic intervention in order to improve the following deficits and impairments:  Hypomobility, Impaired sensation, Increased edema, Pain, Decreased strength, Decreased activity tolerance, Decreased mobility, Difficulty walking, Decreased range of motion, Impaired flexibility, Postural dysfunction  Visit Diagnosis: Stiffness  of left foot, not elsewhere classified  Pain in left ankle and joints of left foot  Muscle weakness (generalized)  Difficulty in walking, not  elsewhere classified     Problem List Patient Active Problem List   Diagnosis Date Noted  . Tobacco abuse 05/03/2017  . Depression with anxiety 05/09/2013  . Obesity, unspecified 04/07/2013  . Insomnia 04/07/2013  . Hypothyroidism 04/07/2013  . Essential hypertension 04/04/2013  . Grief reaction 04/04/2013    Bess Harvest, PTA 07/25/17 9:10 AM   [facility] [address]  [place], [place], [address] Phone: [phone]   Fax:  [phone]  Name: Tony Walters MRN: [id number] Date of Birth: April 10, 1975

## 2017-07-28 ENCOUNTER — Ambulatory Visit: Payer: BLUE CROSS/BLUE SHIELD | Admitting: Physical Therapy

## 2017-07-28 ENCOUNTER — Encounter: Payer: Self-pay | Admitting: Physical Therapy

## 2017-07-28 DIAGNOSIS — M6281 Muscle weakness (generalized): Secondary | ICD-10-CM | POA: Diagnosis not present

## 2017-07-28 DIAGNOSIS — R262 Difficulty in walking, not elsewhere classified: Secondary | ICD-10-CM

## 2017-07-28 DIAGNOSIS — M25572 Pain in left ankle and joints of left foot: Secondary | ICD-10-CM

## 2017-07-28 DIAGNOSIS — M25675 Stiffness of left foot, not elsewhere classified: Secondary | ICD-10-CM

## 2017-07-28 NOTE — Therapy (Signed)
[facility] [address]  [place], [place], [address] Phone: [phone]   Fax:  [phone]  Physical Therapy Treatment  Patient Details  Name: Tony Walters MRN: [id number] Date of Birth: [birth year] Referring Provider: Tyson Dense, DPM   Encounter Date: 07/28/2017  PT End of Session - 07/28/17 1010    Visit Number  15    Number of Visits  19    Date for PT Re-Evaluation  08/11/17    Authorization Type  BCBS    PT Start Time  0856    PT Stop Time  0928    PT Time Calculation (min)  32 min    Equipment Utilized During Treatment  Gait belt    Activity Tolerance  Patient tolerated treatment well    Behavior During Therapy  [facility] for tasks assessed/performed       Past Medical History:  Diagnosis Date  . Chicken pox as a child  . Elevated BP 04/04/2013  . Grief reaction 04/04/2013  . HTN (hypertension) 04/04/2013  . PTSD (post-traumatic stress disorder)   . Unspecified hypothyroidism 04/07/2013    Past Surgical History:  Procedure Laterality Date  . HYDROCELE EXCISION / REPAIR  42 years old  . WISDOM TOOTH EXTRACTION  2010    There were no vitals filed for this visit.  Subjective Assessment - 07/28/17 0857    Subjective  Had a flat tire. Patient reports he can finally fit in regular shoes.    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  Yes    Pain Score  1     Pain Location  Foot    Pain Orientation  Left    Pain Descriptors / Indicators  Aching;Throbbing                       [facility] Adult PT Treatment/Exercise - 07/28/17 0001      Knee/Hip Exercises: Stretches   Gastroc Stretch  Left;2 reps;20 seconds    Gastroc Stretch Limitations  at wall + great toe stretch       Knee/Hip Exercises: Aerobic   Stationary Bike  L3 x 81mn      Knee/Hip Exercises: Machines for Strengthening   Cybex Leg Press  B LE leg press 15x with 55/65#      Knee/Hip Exercises: Standing   Heel Raises  Both;1 set;15 reps;Limitations    Heel Raises Limitations  ball between heels    Functional Squat  1 set;15 reps;Limitations    Functional Squat Limitations  TCs to bring hips back    Lunge Walking - Round Trips  R stationary lunge w/ L heel up 15x with break in bw CGA for balance and TCs to bring hips straight down    Other Standing Knee Exercises  green TB under L big toe, oppostie SKTC; 15x      Ankle Exercises: Machines for Strengthening   Cybex Leg Press  Bent and straight leg calf raise 35# x 20 reps each way       Ankle Exercises: Standing   SLS  L SLS on floor 2x 20" without UE support on foam and EC    Heel Raises  Both;10 reps;Limitations on foam  PT Short Term Goals - 07/14/17 0947      PT SHORT TERM GOAL #1   Title  Patient to be independent with inital HEP.    Time  2    Period  Weeks    Status  Achieved        PT Long Term Goals - 07/18/17 0913      PT LONG TERM GOAL #1   Title  Patient to be independent with advanced HEP.    Time  4    Period  Weeks    Status  Partially Met met for current      PT LONG TERM GOAL #2   Title  Patient to report tolerance of 1.5 hours of walking without c/o pain.    Time  4    Period  Weeks    Status  Achieved reports 1.5-2 hours of standing/walking without pain      PT LONG TERM GOAL #3   Title  Patient to demonstrate WFL L 1st MTP and ankle AROM to allow for efficient gait pattern.    Time  4    Period  Weeks    Status  Partially Met L MTP 30 extension, 45 flexion      PT LONG TERM GOAL #4   Title  Patient to demonstrate >=4+/5 L LE strength.    Time  4    Period  Weeks    Status  Partially Met limited in HS strength      PT LONG TERM GOAL #5   Title  Patient to demonstrate reciprocal step-through pattern with use  of 1 handrail as needed to climb up/down 1 flight of stairs to allow safe access to home.    Time  4    Period  Weeks    Status  Achieved      PT LONG TERM GOAL #6   Title  Patient to report tolerance of wearing work shoe on L foot for 5 hours with <=2/10 pain.     Time  4    Period  Weeks    Status  On-going      PT LONG TERM GOAL #7   Title  Patient to tolerate SLS on L LE for 15 sec without LOB.    Time  4    Period  Weeks    Status  Achieved            Plan - 07/28/17 1205    Clinical Impression Statement  Patient arrived to session late with report that he had a flat tire. Notes he is able to wear normal shoes. Focused session on standing closed chain LE strengthening and standing proprioceptive/balance work today. Attempted B LE balance on BAPS board and bosu ball but patient with severe LOB and unable to tolerate. Opted for airex with better tolerance. Patient with improved form with squat today- still required TCs to bring hips back. Addition of lunge with L heel up- patient with good form but limited tolerance d/t L great toe pain. Able to complete set after 1 standing rest break. Patient with excellent SLS balance on L LE on airex and with EC. Patient progressing well. Plan to wrap up in future visits.     PT Treatment/Interventions  ADLs/Self Care Home Management;Cryotherapy;Electrical Stimulation;Iontophoresis 26m/ml Dexamethasone;Moist Heat;Therapeutic exercise;Therapeutic activities;Functional mobility training;Stair training;Gait training;Ultrasound;Balance training;Neuromuscular re-education;Patient/family education;Orthotic Fit/Training;Manual techniques;Vasopneumatic Device;Taping;Splinting;Dry needling;Passive range of motion;Scar mobilization;Compression bandaging    PT Next Visit Plan  reassess bosu balance    Consulted  and Agree with Plan of Care  Patient       Patient will benefit from skilled therapeutic intervention in order to improve the following deficits  and impairments:  Hypomobility, Impaired sensation, Increased edema, Pain, Decreased strength, Decreased activity tolerance, Decreased mobility, Difficulty walking, Decreased range of motion, Impaired flexibility, Postural dysfunction  Visit Diagnosis: Stiffness of left foot, not elsewhere classified  Pain in left ankle and joints of left foot  Muscle weakness (generalized)  Difficulty in walking, not elsewhere classified     Problem List Patient Active Problem List   Diagnosis Date Noted  . Tobacco abuse 05/03/2017  . Depression with anxiety 05/09/2013  . Obesity, unspecified 04/07/2013  . Insomnia 04/07/2013  . Hypothyroidism 04/07/2013  . Essential hypertension 04/04/2013  . Grief reaction 04/04/2013   Janene Harvey, PT, DPT 07/28/17 12:12 PM   [facility] [address]  [place], [place], [address] Phone: [phone]   Fax:  [phone]  Name: Tony Walters MRN: [id number] Date of Birth: 09-27-1975

## 2017-08-01 ENCOUNTER — Ambulatory Visit: Payer: BLUE CROSS/BLUE SHIELD

## 2017-08-01 DIAGNOSIS — R262 Difficulty in walking, not elsewhere classified: Secondary | ICD-10-CM

## 2017-08-01 DIAGNOSIS — M6281 Muscle weakness (generalized): Secondary | ICD-10-CM | POA: Diagnosis not present

## 2017-08-01 DIAGNOSIS — M25675 Stiffness of left foot, not elsewhere classified: Secondary | ICD-10-CM | POA: Diagnosis not present

## 2017-08-01 DIAGNOSIS — M25572 Pain in left ankle and joints of left foot: Secondary | ICD-10-CM | POA: Diagnosis not present

## 2017-08-01 NOTE — Therapy (Addendum)
[facility] [address]  [place], [place], [address] Phone: [phone]   Fax:  [phone]  Physical Therapy Treatment  Patient Details  Name: Tony Walters MRN: [id number] Date of Birth: April 30, 1975 Referring Provider: Tyson Dense, DPM   Encounter Date: 08/01/2017  PT End of Session - 08/01/17 0849    Visit Number  16    Number of Visits  19    Date for PT Re-Evaluation  08/11/17    Authorization Type  BCBS    PT Start Time  [id number]    PT Stop Time  0930    PT Time Calculation (min)  43 min    Equipment Utilized During Treatment  --    Activity Tolerance  Patient tolerated treatment well    Behavior During Therapy  [facility] for tasks assessed/performed       Past Medical History:  Diagnosis Date  . Chicken pox as a child  . Elevated BP 04/04/2013  . Grief reaction 04/04/2013  . HTN (hypertension) 04/04/2013  . PTSD (post-traumatic stress disorder)   . Unspecified hypothyroidism 04/07/2013    Past Surgical History:  Procedure Laterality Date  . HYDROCELE EXCISION / REPAIR  42 years old  . WISDOM TOOTH EXTRACTION  2010    There were no vitals filed for this visit.  Subjective Assessment - 08/01/17 0848    Subjective  Pt. seen walking into session with work shoes.  Some stiffness in L arch of foot.  Fell on Saturday while, "Missing a step" however does not feel he hurt himself.      Limitations  Walking    How long can you sit comfortably?  not limited     How long can you stand comfortably?  5 hours     How long can you walk comfortably?  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  No/denies    Pain Score  0-No pain    Multiple Pain Sites  No         [facility] PT Assessment - 08/01/17 0904      AROM   Left Ankle Dorsiflexion  14 MTP extension 42,  MTP flexion 50    Left Ankle Plantar Flexion  40      Strength   Right Knee Flexion  4+/5    Left Knee Flexion  4+/5                   [facility] Adult PT Treatment/Exercise - 08/01/17 0921      Self-Care   Self-Care  Other Self-Care Comments    Other Self-Care Comments   Dicussion of current comprehensive HEP to check for appropriateness;  Reissued addended HEP removing initial HEP as this is now "easy" for pt.       Knee/Hip Exercises: Aerobic   Stationary Bike  L3 x 16mn      Manual Therapy   Manual Therapy  Soft tissue mobilization;Passive ROM    Manual therapy comments  supine     Soft tissue mobilization  STM/DTM to L plantar incision     Passive ROM  L DF+great toe ext. stretch with therapist 2x30 sec       Ankle Exercises: Standing  Heel Walk (Round Trip)  80 ft     Toe Walk (Round Trip)  80 ft     Side Shuffle (Round Trip)  Side stepping with green looped TB at forefoot 2 x 50 ft              PT Education - 08/02/17 0951    Education provided  Yes    Education Details  HEP addition     Person(s) Educated  Patient    Methods  Explanation;Demonstration;Verbal cues;Handout    Comprehension  Verbalized understanding;Returned demonstration;Verbal cues required;Need further instruction       PT Short Term Goals - 07/14/17 0947      PT SHORT TERM GOAL #1   Title  Patient to be independent with inital HEP.    Time  2    Period  Weeks    Status  Achieved        PT Long Term Goals - 08/01/17 0855      PT LONG TERM GOAL #1   Title  Patient to be independent with advanced HEP.    Time  4    Period  Weeks    Status  Partially Met met for current      PT LONG TERM GOAL #2   Title  Patient to report tolerance of 1.5 hours of walking without c/o pain.    Time  4    Period  Weeks    Status  Achieved reports 5 hours of standing/walking without pain      PT LONG TERM GOAL #3   Title  Patient to  demonstrate WFL L 1st MTP and ankle AROM to allow for efficient gait pattern.    Time  4    Period  Weeks    Status  Partially Met L MTP 42 extension, 50 flexion      PT LONG TERM GOAL #4   Title  Patient to demonstrate >=4+/5 L LE strength.    Time  4    Period  Weeks    Status  Achieved      PT LONG TERM GOAL #5   Title  Patient to demonstrate reciprocal step-through pattern with use of 1 handrail as needed to climb up/down 1 flight of stairs to allow safe access to home.    Time  4    Period  Weeks    Status  Achieved      PT LONG TERM GOAL #6   Title  Patient to report tolerance of wearing work shoe on L foot for 5 hours with <=2/10 pain.     Time  4    Period  Weeks    Status  Partially Met Noting ~ 6 hour tolerance however notes pain will likely rise to 6/10      PT LONG TERM GOAL #7   Title  Patient to tolerate SLS on L LE for 15 sec without LOB.    Time  4    Period  Weeks    Status  Achieved            Plan - 08/01/17 4503    Clinical Impression Statement  Pt. noting he is back to "Wearing normal" shoes now and feels, "Drastic improvement", since Friday of last week in tolerance for wearing work shoes.  Pt. reporting he plans to shadow a coworker and test increased duration of work shoe tolerance in coming weeks.  Pt. able to fully achieve ROM and strength goal today with MMT.  Has now met all therapy goals with exception of partially meeting work shoe tolerance goal.  Making good progress toward remaining goals and did discuss possibility of finishing up with therapy in future visits.      PT Treatment/Interventions  ADLs/Self Care Home Management;Cryotherapy;Electrical Stimulation;Iontophoresis 92m/ml Dexamethasone;Moist Heat;Therapeutic exercise;Therapeutic activities;Functional mobility training;Stair training;Gait training;Ultrasound;Balance training;Neuromuscular re-education;Patient/family education;Orthotic Fit/Training;Manual techniques;Vasopneumatic  Device;Taping;Splinting;Dry needling;Passive range of motion;Scar mobilization;Compression bandaging    Consulted and Agree with Plan of Care  Patient       Patient will benefit from skilled therapeutic intervention in order to improve the following deficits and impairments:  Hypomobility, Impaired sensation, Increased edema, Pain, Decreased strength, Decreased activity tolerance, Decreased mobility, Difficulty walking, Decreased range of motion, Impaired flexibility, Postural dysfunction  Visit Diagnosis: Stiffness of left foot, not elsewhere classified  Pain in left ankle and joints of left foot  Muscle weakness (generalized)  Difficulty in walking, not elsewhere classified     Problem List Patient Active Problem List   Diagnosis Date Noted  . Tobacco abuse 05/03/2017  . Depression with anxiety 05/09/2013  . Obesity, unspecified 04/07/2013  . Insomnia 04/07/2013  . Hypothyroidism 04/07/2013  . Essential hypertension 04/04/2013  . Grief reaction 04/04/2013    MBess Harvest PTA 08/02/17 9:51 AM   [facility] [place] [address]Phone: [phone]  Fax:  [phone] Name: Tony DELAYMRN: [id number] of Birth: 105-15-1977

## 2017-08-04 ENCOUNTER — Ambulatory Visit: Payer: BLUE CROSS/BLUE SHIELD | Admitting: Physical Therapy

## 2017-08-08 ENCOUNTER — Encounter: Payer: Self-pay | Admitting: Physical Therapy

## 2017-08-08 ENCOUNTER — Ambulatory Visit: Payer: BLUE CROSS/BLUE SHIELD | Admitting: Physical Therapy

## 2017-08-08 DIAGNOSIS — M25572 Pain in left ankle and joints of left foot: Secondary | ICD-10-CM

## 2017-08-08 DIAGNOSIS — R262 Difficulty in walking, not elsewhere classified: Secondary | ICD-10-CM

## 2017-08-08 DIAGNOSIS — M6281 Muscle weakness (generalized): Secondary | ICD-10-CM | POA: Diagnosis not present

## 2017-08-08 DIAGNOSIS — M25675 Stiffness of left foot, not elsewhere classified: Secondary | ICD-10-CM | POA: Diagnosis not present

## 2017-08-08 NOTE — Therapy (Signed)
[facility] [address]  [place], [place], [address] Phone: [phone]   Fax:  [phone]  Physical Therapy Discharge  Patient Details  Name: Tony Walters MRN: [id number] Date of Birth: 09/20/1975 Referring Provider: Tyson Dense, DPM   Progress Note Reporting Period 06/20/17 to 08/08/17  See note below for Objective Data and Assessment of Progress/Goals.    Encounter Date: 08/08/2017  PT End of Session - 08/08/17 1346    Visit Number  17    Number of Visits  19    Date for PT Re-Evaluation  08/11/17    Authorization Type  [employer]    PT Start Time  0803    PT Stop Time  0841    PT Time Calculation (min)  38 min    Equipment Utilized During Treatment  Gait belt    Activity Tolerance  Patient tolerated treatment well    Behavior During Therapy  WFL for tasks assessed/performed       Past Medical History:  Diagnosis Date  . Chicken pox as a child  . Elevated BP 04/04/2013  . Grief reaction 04/04/2013  . HTN (hypertension) 04/04/2013  . PTSD (post-traumatic stress disorder)   . Unspecified hypothyroidism 04/07/2013    Past Surgical History:  Procedure Laterality Date  . HYDROCELE EXCISION / REPAIR  42 years old  . WISDOM TOOTH EXTRACTION  2010    There were no vitals filed for this visit.  Subjective Assessment - 08/08/17 0804    Subjective  Last week had some swelling after wearing shoes all day. Reports he is independent with HEP and ready to return to work soon. Reports 85-90% improvement since initial eval. Reports the only thing he struggles with is wearing shoes for a prolonged time.     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  No/denies         [facility] PT Assessment - 08/08/17 0001      Observation/Other  Assessments   Focus on Therapeutic Outcomes (FOTO)   Foot: 73 (27% limited, 28% predicted)      AROM   AROM Assessment Site  Ankle    Right/Left Ankle  Left    Left Ankle Dorsiflexion  14 L 1st MTP extension 42, flexion 55    Left Ankle Plantar Flexion  58      PROM   Right/Left Ankle  Left    Left Ankle Dorsiflexion  -- L 1st MTP extension 58, flexion 55      Strength   Strength Assessment Site  Hip;Knee;Ankle    Right/Left Hip  Right;Left    Right Hip ABduction  4+/5    Right Hip ADduction  4+/5    Left Hip Flexion  4+/5    Left Hip ABduction  4+/5    Left Hip ADduction  4+/5    Right Knee Flexion  4+/5    Left Knee Flexion  4+/5    Right/Left Ankle  Right;Left    Right Ankle Dorsiflexion  4+/5    Right Ankle Plantar Flexion  4+/5    Left Ankle Dorsiflexion  4+/5    Left Ankle Plantar Flexion  4+/5  [facility] PT Treatment/Exercise - 08/08/17 0001      Knee/Hip Exercises: Aerobic   Stationary Bike  L4x6 min      Knee/Hip Exercises: Machines for Strengthening   Cybex Leg Press  B LE leg press 10x with 55#; B LE heel raise with knees bent 10 x 55#      Knee/Hip Exercises: Standing   Functional Squat  1 set;10 reps    Functional Squat Limitations  on foam; TC/VCs to bring hips back and keep trunk upright    Lunge Walking - Round Trips  4x43f alternating walking lunges VCs to avoid anterior translation of tibia    SLS  L SLS on foam with multidirectional perturbations; 2x45 sec      Manual Therapy   Manual Therapy  Joint mobilization    Manual therapy comments  supine     Joint Mobilization  L 1st MTP distraction and mobs into flex/ex; grade III/IV      Ankle Exercises: Seated   Other Seated Ankle Exercises  1/2 kneeling forward lunge L 1st MTP extension stretch with toe on dumbell; 10x5"      Ankle Exercises: Stretches   Gastroc Stretch  2 reps;30 seconds    Gastroc Stretch Limitations  L LE; toe up against wall              PT Education - 08/08/17 1346    Education provided  Yes    Education Details  edu on continuing HEP and fitness regimen     Person(s) Educated  Patient    Methods  Explanation    Comprehension  Verbalized understanding       PT Short Term Goals - 08/08/17 0810      PT SHORT TERM GOAL #1   Title  Patient to be independent with inital HEP.    Time  2    Period  Weeks    Status  Achieved        PT Long Term Goals - 08/08/17 0810      PT LONG TERM GOAL #1   Title  Patient to be independent with advanced HEP.    Time  4    Period  Weeks    Status  Achieved      PT LONG TERM GOAL #2   Title  Patient to report tolerance of 1.5 hours of walking without c/o pain.    Time  4    Period  Weeks    Status  Achieved reports able to last all day in any type of shoe      PT LONG TERM GOAL #3   Title  [facility] and ankle AROM to allow for efficient gait pattern.    Time  4    Period  Weeks    Status  Partially Met L MTP 42 extension, 55 flexion      PT LONG TERM GOAL #4   Title  Patient to demonstrate >=4+/5 L LE strength.    Time  4    Period  Weeks    Status  Achieved      PT LONG TERM GOAL #5   Title  Patient to demonstrate reciprocal step-through pattern with use of 1 handrail as needed to climb up/down 1 flight of stairs to allow safe access to home.    Time  4    Period  Weeks    Status  Achieved      PT LONG TERM GOAL #  6   Title  Patient to report tolerance of wearing work shoe on L foot for 5 hours with <=2/10 pain.     Time  4    Period  Weeks    Status  Achieved Reports he can wear work shoes all day; some feeling of "snuggness" but tolerable      PT LONG TERM GOAL #7   Title  Patient to tolerate SLS on L LE for 15 sec without LOB.    Time  4    Period  Weeks    Status  Achieved            Plan - 08/08/17 1347    Clinical Impression Statement  Patient arrived to session with report of 85-90% improvement in L  foot since initial eval. Reports wearing shoes for a prolonged amount of time is his most limited factor, with intermittent swelling of L foot when wearing a shoe all day. However, patient reports being able to wear casual, dress, and work shoes without much problem. Updated goals this date- patient able to walk throughout day without increase in pain, met strength goal, met stair goal, and balance goal. Still a bit limited in L 1st MTP AROM, which is expected for his surgery. Patient overall functional and ready for return to work later this month. Patient tolerated joint mobs to L MTP into flexion and extension without c/o pain. Able to perform ankle and great toe stretches and closed chain exercises with good effort. VCs required to correct form during squats and lunges. HEP updated last session, and patient with no questions about HEP regimen. Completed session without pain. Patient to be D/C'd at this time as he has met or nearly met all goals and ready for return to work. Patient in agreement.     PT Treatment/Interventions  ADLs/Self Care Home Management;Cryotherapy;Electrical Stimulation;Iontophoresis 89m/ml Dexamethasone;Moist Heat;Therapeutic exercise;Therapeutic activities;Functional mobility training;Stair training;Gait training;Ultrasound;Balance training;Neuromuscular re-education;Patient/family education;Orthotic Fit/Training;Manual techniques;Vasopneumatic Device;Taping;Splinting;Dry needling;Passive range of motion;Scar mobilization;Compression bandaging    PT Next Visit Plan  d/c'd at this time    Consulted and Agree with Plan of Care  Patient       Patient will benefit from skilled therapeutic intervention in order to improve the following deficits and impairments:  Hypomobility, Impaired sensation, Increased edema, Pain, Decreased strength, Decreased activity tolerance, Decreased mobility, Difficulty walking, Decreased range of motion, Impaired flexibility, Postural dysfunction  Visit  Diagnosis: Stiffness of left foot, not elsewhere classified  Pain in left ankle and joints of left foot  Muscle weakness (generalized)  Difficulty in walking, not elsewhere classified     Problem List Patient Active Problem List   Diagnosis Date Noted  . Tobacco abuse 05/03/2017  . Depression with anxiety 05/09/2013  . Obesity, unspecified 04/07/2013  . Insomnia 04/07/2013  . Hypothyroidism 04/07/2013  . Essential hypertension 04/04/2013  . Grief reaction 04/04/2013     PHYSICAL THERAPY DISCHARGE SUMMARY  Visits from Start of Care: 17  Current functional level related to goals / functional outcomes: See above clinical summary   Remaining deficits: Intermittent swelling in L foot after prolonged shoe wear, decreased L 1st MTP ROM   Education / Equipment: HEP Plan: Patient agrees to discharge.  Patient goals were partially met. Patient is being discharged due to meeting the stated rehab goals.  ?????     YJanene Harvey PT, DPT 08/08/17 1:56 PM   [facility] [address] [place] [address]:  [phone]   Fax:  [phone]  Name: Tony Walters MRN: [id number] Date of Birth: [birth year]

## 2017-08-11 ENCOUNTER — Ambulatory Visit: Payer: BLUE CROSS/BLUE SHIELD | Admitting: Physical Therapy

## 2017-08-17 ENCOUNTER — Encounter: Payer: Self-pay | Admitting: Podiatry

## 2017-08-17 ENCOUNTER — Ambulatory Visit (INDEPENDENT_AMBULATORY_CARE_PROVIDER_SITE_OTHER): Payer: BLUE CROSS/BLUE SHIELD | Admitting: Podiatry

## 2017-08-17 ENCOUNTER — Ambulatory Visit (INDEPENDENT_AMBULATORY_CARE_PROVIDER_SITE_OTHER): Payer: BLUE CROSS/BLUE SHIELD

## 2017-08-17 DIAGNOSIS — M2012 Hallux valgus (acquired), left foot: Secondary | ICD-10-CM

## 2017-08-17 DIAGNOSIS — M722 Plantar fascial fibromatosis: Secondary | ICD-10-CM | POA: Diagnosis not present

## 2017-08-17 DIAGNOSIS — Z9889 Other specified postprocedural states: Secondary | ICD-10-CM

## 2017-08-17 MED ORDER — OXYCODONE-ACETAMINOPHEN 10-325 MG PO TABS: 1.0000 | ORAL_TABLET | Freq: Four times a day (QID) | ORAL | 0 refills | Status: AC | PRN

## 2017-08-17 MED ORDER — METHYLPREDNISOLONE 4 MG PO TBPK: ORAL_TABLET | ORAL | 0 refills | Status: DC

## 2017-08-17 MED FILL — OXYCODONE-APAP 10-325: 10-325 | 5 days supply | Qty: 20 | Fill #0

## 2017-08-17 MED FILL — METHYLPREDNISOLONE 4 MG TAB: 4 | 6 days supply | Qty: 21 | Fill #0

## 2017-08-17 NOTE — Progress Notes (Signed)
He presents today date of surgery 04/21/2017 status post [facility] bunionectomy of the left foot with excision of plantar fibroma.  He states that he seems to be doing pretty good there is exquisitely painful on certain days and just seems to give way.  He states that it seems to hurt in the joint more than anywhere else.  Objective: Vital signs are stable he is alert and oriented x3.  Pulses are palpable.  He has some tenderness on range of motion of the first metatarsophalangeal joint at the incisional plantar aspect has gone on to heal uneventfully.  Assessment: Well-healing surgical foot left.  Plan: At this point I am going to request that he continue out of work until pain has resolved 100% I did put him on anti-inflammatories as well as pain medication.  On follow-up with him in 1 month or so.

## 2017-09-12 ENCOUNTER — Telehealth: Payer: Self-pay | Admitting: Podiatry

## 2017-09-12 NOTE — Telephone Encounter (Signed)
This is Abby calling from Dr. Ricki Rodriguez office in regards to a peer to peer. We are calling to see if Dr. Milinda Pointer wanted to speak with Dr. Elisabeth Pigeon to add any restrictions or limitations regarding Mr. Moure disability claim. If so, the cal back number is [phone]. Thank you.

## 2017-09-13 ENCOUNTER — Telehealth: Payer: Self-pay | Admitting: Podiatry

## 2017-09-13 NOTE — Telephone Encounter (Signed)
This is Abby with Dr. Ricki Rodriguez office. I'm calling to leave a message for Dr. Milinda Pointer for a peer to peer on Tony Walters. This is just to see if the doctor wanted to speak to the reviewing physician, Dr. Elisabeth Pigeon to add any restrictions or limitations regarding this patients disability claim. If so, Dr. Ricki Rodriguez number is [phone]. Thank you.

## 2017-09-14 ENCOUNTER — Telehealth: Payer: Self-pay | Admitting: Podiatry

## 2017-09-14 ENCOUNTER — Ambulatory Visit: Payer: BLUE CROSS/BLUE SHIELD | Admitting: Family Medicine

## 2017-09-14 ENCOUNTER — Encounter: Payer: Self-pay | Admitting: Family Medicine

## 2017-09-14 VITALS — BP 134/86 | HR 106 | Temp 98.3°F | Ht 74.0 in | Wt 257.4 lb

## 2017-09-14 DIAGNOSIS — E039 Hypothyroidism, unspecified: Secondary | ICD-10-CM | POA: Diagnosis not present

## 2017-09-14 DIAGNOSIS — I10 Essential (primary) hypertension: Secondary | ICD-10-CM

## 2017-09-14 NOTE — Progress Notes (Signed)
Pre visit review using our clinic review tool, if applicable. No additional management support is needed unless otherwise documented below in the visit note. 

## 2017-09-14 NOTE — Progress Notes (Signed)
Chief Complaint  Patient presents with  . Follow-up    Subjective Tony Walters is a 42 y.o. male who presents for hypertension follow up. He does not routinely monitor home blood pressures. He is compliant with medication- HCTZ 25 mg/d. Patient has these side effects of medication: none He is adhering to a healthy diet overall. Current exercise: walking   Hypothyroidism Patient presents for follow-up of hypothyroidism.  Reports noncompliance with medication- Synthroid 50 mcg/d is taken every other day. Current symptoms include: denies fatigue, unexplained weight changes, heat/cold intolerance, bowel/skin changes or CVS symptoms He believes his dose should be not significantly changed  Past Medical History:  Diagnosis Date  . Chicken pox as a child  . Elevated BP 04/04/2013  . Grief reaction 04/04/2013  . HTN (hypertension) 04/04/2013  . PTSD (post-traumatic stress disorder)   . Unspecified hypothyroidism 04/07/2013    Review of Systems Cardiovascular: no chest pain Respiratory:  no shortness of breath  Exam BP 134/86 (BP Location: Left Arm, Patient Position: Sitting, Cuff Size: Large)   Pulse (!) 106   Temp 98.3 F (36.8 C) (Oral)   Ht 6\' 2"  (1.88 m)   Wt 257 lb 6 oz (116.7 kg)   SpO2 99%   BMI 33.04 kg/m  General:  well developed, well nourished, in no apparent distress Heart: RRR, no bruits, no LE edema Lungs: clear to auscultation, no accessory muscle use Neck: Supple, no thyromegaly, nodules, asymmetry Psych: well oriented with normal range of affect and appropriate judgment/insight  Acquired hypothyroidism - Plan: TSH, T4, free  Essential hypertension  Orders as above. Cont meds. Compliance encouraged. Counseled on diet and exercise. F/u in 6 mo for CPE or prn. The patient voiced understanding and agreement to the plan.  [place], DO 09/14/17  3:24 PM

## 2017-09-14 NOTE — Patient Instructions (Signed)
Take your thyroid medication daily.   Let's recheck labs in 6 weeks.  Clean up the diet.  Stay as active as able.   Let us know if you need anything.

## 2017-09-14 NOTE — Telephone Encounter (Signed)
Dr. Alvino Blood would like to have a peer to peer with you concerning Tony Walters.  Dr. Alvino Blood can be reached at [phone].

## 2017-09-19 ENCOUNTER — Encounter: Payer: Self-pay | Admitting: Podiatry

## 2017-09-19 ENCOUNTER — Ambulatory Visit (INDEPENDENT_AMBULATORY_CARE_PROVIDER_SITE_OTHER): Payer: BLUE CROSS/BLUE SHIELD

## 2017-09-19 ENCOUNTER — Ambulatory Visit (INDEPENDENT_AMBULATORY_CARE_PROVIDER_SITE_OTHER): Payer: BLUE CROSS/BLUE SHIELD | Admitting: Podiatry

## 2017-09-19 ENCOUNTER — Encounter

## 2017-09-19 DIAGNOSIS — Z9889 Other specified postprocedural states: Secondary | ICD-10-CM | POA: Diagnosis not present

## 2017-09-19 DIAGNOSIS — M722 Plantar fascial fibromatosis: Secondary | ICD-10-CM

## 2017-09-19 DIAGNOSIS — M2012 Hallux valgus (acquired), left foot: Secondary | ICD-10-CM

## 2017-09-19 NOTE — Progress Notes (Signed)
He presents today stating that his bunionectomy and his plantar fibroma surgery is doing much better she refers to the left foot.  Date of surgery April 21, 2017.  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  Objective: Vital signs are stable he is alert and oriented x3 still has tenderness on palpation of plantar fibroma there is still considerable swelling.  He has some limited range of motion on dorsiflexion but the light and range of motion that he does have is very free and flexible.   Radiographs taken today demonstrate a well-healing osteotomy screw is intact in good position.  Assessment: Well-healing surgical foot.  Still has some trials and tribulations with that I am not sure that is already to get back to work yet however he states that he needs to do so.  Plan: We will long get back to work I will follow-up with him in 1 month

## 2017-09-20 ENCOUNTER — Encounter: Payer: Self-pay | Admitting: *Deleted

## 2017-09-20 ENCOUNTER — Telehealth: Payer: Self-pay | Admitting: *Deleted

## 2017-09-20 ENCOUNTER — Telehealth: Payer: Self-pay | Admitting: Podiatry

## 2017-09-20 NOTE — Telephone Encounter (Signed)
Pt needs a letter for work stating that he will not be on his pain medication while working. Needs it as soon as possible (today) Please give him a call

## 2017-09-20 NOTE — Telephone Encounter (Signed)
I gave pt the note of 09/20/2017 and he states it should also state that none will be prescribed. I hand wrote on the note, "This office will not prescribe narcotics for this pt." Pt states that would be fine.

## 2017-09-20 NOTE — Telephone Encounter (Signed)
Entered in error

## 2017-09-20 NOTE — Telephone Encounter (Signed)
Pt presents to office for note stating he is not taking oxycodone prescribed by our office. I reviewed clinicals and medication orders, pt has not received a prescription for oxycodone-acetaminophen or other narcotics since 08/17/2017. Note written and given to pt.

## 2017-09-20 NOTE — Telephone Encounter (Signed)
Pt picked up the note 09/20/2017.

## 2017-10-17 ENCOUNTER — Encounter: Payer: Self-pay | Admitting: Podiatry

## 2017-10-17 ENCOUNTER — Ambulatory Visit (INDEPENDENT_AMBULATORY_CARE_PROVIDER_SITE_OTHER): Payer: BLUE CROSS/BLUE SHIELD | Admitting: Podiatry

## 2017-10-17 DIAGNOSIS — M779 Enthesopathy, unspecified: Secondary | ICD-10-CM

## 2017-10-17 DIAGNOSIS — M778 Other enthesopathies, not elsewhere classified: Secondary | ICD-10-CM

## 2017-10-17 MED ORDER — CELECOXIB 200 MG PO CAPS: 200.0000 mg | ORAL_CAPSULE | Freq: Every day | ORAL | 1 refills | Status: DC

## 2017-10-17 MED ORDER — TRIAMCINOLONE ACETONIDE 0.1 % EX CREA: 1.0000 "application " | TOPICAL_CREAM | Freq: Two times a day (BID) | CUTANEOUS | 2 refills | Status: DC

## 2017-10-17 MED FILL — TRIAMCINOLONE 0.1% CREAM: 0.1 | 10 days supply | Qty: 30 | Fill #0

## 2017-10-17 MED FILL — CELECOXIB 200 MG CAP: 200 | 30 days supply | Qty: 30 | Fill #0

## 2017-10-18 NOTE — Progress Notes (Signed)
He presents today for follow-up of left foot date of surgery 04/21/2017 [facility] bunionectomy excision plantar fibroma he states that it still hurts by the end of the day.  Objective: Vital signs are stable he is alert and oriented x3.  Pulses are palpable.  He has great range of motion of the first metatarsophalangeal joint mild tenderness on palpation between the intermetatarsal area.  Radiographs demonstrate a well-healing site.  Assessment: Well-healing surgical foot.  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  So he was scanned for set of orthotics today.  Changed his anti-inflammatory to Celebrex 200 mg once a day

## 2017-10-22 ENCOUNTER — Encounter (HOSPITAL_BASED_OUTPATIENT_CLINIC_OR_DEPARTMENT_OTHER): Payer: Self-pay | Admitting: *Deleted

## 2017-10-22 ENCOUNTER — Emergency Department (HOSPITAL_BASED_OUTPATIENT_CLINIC_OR_DEPARTMENT_OTHER)
Admission: EM | Admit: 2017-10-22 | Discharge: 2017-10-23 | Disposition: A | Discharge status: Home / Self Care | Payer: BLUE CROSS/BLUE SHIELD | Attending: Emergency Medicine | Admitting: Emergency Medicine

## 2017-10-22 ENCOUNTER — Other Ambulatory Visit: Payer: Self-pay

## 2017-10-22 DIAGNOSIS — M549 Dorsalgia, unspecified: Secondary | ICD-10-CM | POA: Diagnosis not present

## 2017-10-22 DIAGNOSIS — I10 Essential (primary) hypertension: Secondary | ICD-10-CM | POA: Diagnosis not present

## 2017-10-22 DIAGNOSIS — E039 Hypothyroidism, unspecified: Secondary | ICD-10-CM | POA: Diagnosis not present

## 2017-10-22 DIAGNOSIS — F17210 Nicotine dependence, cigarettes, uncomplicated: Secondary | ICD-10-CM | POA: Diagnosis not present

## 2017-10-22 DIAGNOSIS — M545 Low back pain, unspecified: Secondary | ICD-10-CM

## 2017-10-22 DIAGNOSIS — Z79899 Other long term (current) drug therapy: Secondary | ICD-10-CM | POA: Insufficient documentation

## 2017-10-22 MED ORDER — KETOROLAC TROMETHAMINE 30 MG/ML IJ SOLN
30.0000 mg | Freq: Once | INTRAMUSCULAR | Status: AC
  Administered 2017-10-23: 30 mg via INTRAMUSCULAR
  Filled 2017-10-22: qty 1

## 2017-10-22 MED ORDER — CYCLOBENZAPRINE HCL 5 MG PO TABS
5.0000 mg | ORAL_TABLET | Freq: Once | ORAL | Status: AC
  Administered 2017-10-23: 5 mg via ORAL
  Filled 2017-10-22: qty 1

## 2017-10-22 NOTE — ED Notes (Signed)
Pt given two cups of water in attempt to obtain urine sample. Pt aware of need.

## 2017-10-22 NOTE — ED Provider Notes (Signed)
[facility] EMERGENCY DEPARTMENT Provider Note   CSN: [id number] Arrival date & time: 10/22/17  2236     History   Chief Complaint Chief Complaint  Patient presents with  . Back Pain    HPI Tony Walters is a 42 y.o. male.  HPI  This 42 year old male who presents with back pain.  Patient reports onset of symptoms on Friday.  Patient denies any injury or heavy lifting.  He reports bilateral lower back pain which she describes as achy.  It is currently 4 out of 10.  He states pain is worse with ambulation and certain movements.  He has not taken anything for his pain.  He denies any urinary symptoms.  Denies any weakness, numbness, tingling of the lower extremity, radiation of the pain.  Denies any bowel or bladder difficulty.  Denies any hematuria or dysuria.  Denies any history of fevers, IV drug use, immunosuppression.  Patient does note that at his work physical he had asymptomatic microscopic blood in his urine.  He is to follow-up with his primary physician.  Past Medical History:  Diagnosis Date  . Chicken pox as a child  . Elevated BP 04/04/2013  . Grief reaction 04/04/2013  . HTN (hypertension) 04/04/2013  . PTSD (post-traumatic stress disorder)   . Unspecified hypothyroidism 04/07/2013    Patient Active Problem List   Diagnosis Date Noted  . Tobacco abuse 05/03/2017  . Depression with anxiety 05/09/2013  . Obesity, unspecified 04/07/2013  . Insomnia 04/07/2013  . Hypothyroidism 04/07/2013  . Essential hypertension 04/04/2013  . Grief reaction 04/04/2013    Past Surgical History:  Procedure Laterality Date  . HYDROCELE EXCISION / REPAIR  42 years old  . WISDOM TOOTH EXTRACTION  2010        Home Medications    Prior to Admission medications   Medication Sig Start Date End Date Taking? Authorizing Provider  celecoxib (CELEBREX) 200 MG capsule Take 1 capsule (200 mg total) by mouth daily. 10/17/17  Yes Hyatt, Max T, DPM  hydrochlorothiazide  (HYDRODIURIL) 25 MG tablet Take 1 tablet (25 mg total) by mouth daily. 03/15/17  Yes Shelda Pal, DO  levothyroxine (SYNTHROID, LEVOTHROID) 50 MCG tablet Take 1 tablet (50 mcg total) by mouth daily. 03/16/17  Yes Shelda Pal, DO  cyclobenzaprine (FLEXERIL) 5 MG tablet Take 1 tablet (5 mg total) by mouth 2 (two) times daily as needed. 10/23/17   Horton, Barbette Hair, MD  naproxen (NAPROSYN) 500 MG tablet Take 1 tablet (500 mg total) by mouth 2 (two) times daily. 10/23/17   Horton, Barbette Hair, MD  triamcinolone cream (KENALOG) 0.1 % Apply 1 application topically 2 (two) times daily. 10/17/17   Hyatt, Max T, DPM    Family History Family History  Problem Relation Age of Onset  . Congestive Heart Failure Father   . Hyperlipidemia Father   . Hypertension Father   . Kidney disease Maternal Grandmother   . Diabetes Maternal Grandmother        type 2  . Stroke Maternal Grandmother   . Alcohol abuse Paternal Grandmother   . Pneumonia Paternal Grandfather   . Asthma Brother     Social History Social History   Tobacco Use  . Smoking status: Current Every Day Smoker    Packs/day: 0.50    Years: 25.00    Pack years: 12.50    Types: Cigarettes  . Smokeless tobacco: Never Used  . Tobacco comment: quit in Nov 2014 but started back 03-18-13  Substance Use Topics  . Alcohol use: Yes    Comment: occasionally  . Drug use: No     Allergies   Patient has no known allergies.   Review of Systems Review of Systems  Constitutional: Negative for fever.  Genitourinary: Negative for dysuria and hematuria.  Musculoskeletal: Positive for back pain.  Neurological: Negative for weakness and numbness.  All other systems reviewed and are negative.    Physical Exam Updated Vital Signs BP (!) 164/100 (BP Location: Left Arm)   Pulse 73   Temp 98.2 F (36.8 C)   Resp 18   Ht 1.88 m (6\' 2" )   Wt 117.9 kg   SpO2 99%   BMI 33.38 kg/m   Physical Exam  Constitutional: He is  oriented to person, place, and time. He appears well-developed and well-nourished.  Overweight  HENT:  Head: Normocephalic and atraumatic.  Eyes: Pupils are equal, round, and reactive to light.  Cardiovascular: Normal rate, regular rhythm and normal heart sounds.  No murmur heard. Pulmonary/Chest: Effort normal and breath sounds normal. No respiratory distress. He has no wheezes.  Abdominal: Soft. Bowel sounds are normal. There is no tenderness. There is no rebound.  Musculoskeletal: He exhibits no edema.  Tenderness palpation bilateral paraspinous musculature of the lumbar spine, no midline tenderness, step-off, deformity  Neurological: He is alert and oriented to person, place, and time.  Strength bilateral lower extremities, no clonus, normal and equal patellar reflexes bilaterally  Skin: Skin is warm and dry.  Psychiatric: He has a normal mood and affect.  Nursing note and vitals reviewed.    ED Treatments / Results  Labs (all labs ordered are listed, but only abnormal results are displayed) Labs Reviewed  URINALYSIS, ROUTINE W REFLEX MICROSCOPIC - Abnormal; Notable for the following components:      Result Value   Hgb urine dipstick TRACE (*)    All other components within normal limits  URINALYSIS, MICROSCOPIC (REFLEX)    EKG None  Radiology No results found.  Procedures Procedures (including critical care time)  Medications Ordered in ED Medications  cyclobenzaprine (FLEXERIL) tablet 5 mg (has no administration in time range)  ketorolac (TORADOL) 30 MG/ML injection 30 mg (30 mg Intramuscular Given 10/23/17 0020)     Initial Impression / Assessment and Plan / ED Course  I have reviewed the triage vital signs and the nursing notes.  Pertinent labs & imaging results that were available during my care of the patient were reviewed by me and considered in my medical decision making (see chart for details).     Patient presents with back pain.  He is overall  nontoxic appearing and vital signs are reassuring.  There are no red flags.  He is neurologically intact.  Reports pain worse with range of motion and walking.  Suggestive of musculoskeletal etiology.  No signs or symptoms of cauda equina.  Patient was given IM Toradol.  He is driving so further sedating medications were withheld.  Given intact exam, do not feel imaging is necessary at this time.  Does report history of hematuria.  However, history is not consistent with kidney stone.  Urinalysis with 0-5 red cells but otherwise is clear.  Will treat supportively.  After history, exam, and medical workup I feel the patient has been appropriately medically screened and is safe for discharge home. Pertinent diagnoses were discussed with the patient. Patient was given return precautions.   Final Clinical Impressions(s) / ED Diagnoses   Final diagnoses:  Acute bilateral  low back pain without sciatica    ED Discharge Orders         Ordered    naproxen (NAPROSYN) 500 MG tablet  2 times daily     10/23/17 0047    cyclobenzaprine (FLEXERIL) 5 MG tablet  2 times daily PRN     10/23/17 0047           Merryl Hacker, MD 10/23/17 [phone]

## 2017-10-22 NOTE — ED Triage Notes (Signed)
Pt reports difficulty sleeping tonight due to pain in his lower back. Denies injury. States Sx started on friday

## 2017-10-23 LAB — URINALYSIS, ROUTINE W REFLEX MICROSCOPIC
Bilirubin Urine: NEGATIVE
Glucose, UA: NEGATIVE mg/dL
Ketones, ur: NEGATIVE mg/dL
Leukocytes, UA: NEGATIVE
Nitrite: NEGATIVE
Protein, ur: NEGATIVE mg/dL
Specific Gravity, Urine: 1.025 (ref 1.005–1.030)
pH: 5.5 (ref 5.0–8.0)

## 2017-10-23 LAB — URINALYSIS, MICROSCOPIC (REFLEX)
Bacteria, UA: NONE SEEN
WBC, UA: NONE SEEN WBC/hpf (ref 0–5)

## 2017-10-23 MED ORDER — NAPROXEN 500 MG PO TABS: 500.0000 mg | ORAL_TABLET | Freq: Two times a day (BID) | ORAL | 0 refills | Status: DC

## 2017-10-23 MED ORDER — CYCLOBENZAPRINE HCL 5 MG PO TABS: 5.0000 mg | ORAL_TABLET | Freq: Two times a day (BID) | ORAL | 0 refills | Status: DC | PRN

## 2017-10-24 MED FILL — NAPROXEN 500 MG TABLET: 500 | 15 days supply | Qty: 30 | Fill #0

## 2017-10-24 MED FILL — CYCLOBENZAPRINE 5 MG TABLET: 5 | 5 days supply | Qty: 10 | Fill #0

## 2017-10-26 ENCOUNTER — Other Ambulatory Visit (INDEPENDENT_AMBULATORY_CARE_PROVIDER_SITE_OTHER): Payer: BLUE CROSS/BLUE SHIELD

## 2017-10-26 DIAGNOSIS — E039 Hypothyroidism, unspecified: Secondary | ICD-10-CM | POA: Diagnosis not present

## 2017-10-26 LAB — TSH: TSH: 17.12 u[IU]/mL — ABNORMAL HIGH (ref 0.35–4.50)

## 2017-10-26 LAB — T4, FREE: Free T4: 0.61 ng/dL (ref 0.60–1.60)

## 2017-11-08 ENCOUNTER — Encounter: Payer: BLUE CROSS/BLUE SHIELD | Admitting: Orthotics

## 2017-11-15 ENCOUNTER — Ambulatory Visit: Payer: BLUE CROSS/BLUE SHIELD | Admitting: Orthotics

## 2017-11-15 DIAGNOSIS — M722 Plantar fascial fibromatosis: Secondary | ICD-10-CM

## 2017-11-15 DIAGNOSIS — M778 Other enthesopathies, not elsewhere classified: Secondary | ICD-10-CM

## 2017-11-15 DIAGNOSIS — M779 Enthesopathy, unspecified: Principal | ICD-10-CM

## 2017-11-15 DIAGNOSIS — M2012 Hallux valgus (acquired), left foot: Secondary | ICD-10-CM

## 2017-12-01 NOTE — Progress Notes (Signed)
Patient came in today to pick up custom made foot orthotics.  The goals were accomplished and the patient reported no dissatisfaction with said orthotics.  Patient was advised of breakin period and how to report any issues.Patient came in today to pick up custom made foot orthotics.  The goals were accomplished and the patient reported no dissatisfaction with said orthotics.  Patient was advised of breakin period and how to report any issues. 

## 2017-12-19 ENCOUNTER — Encounter: Payer: Self-pay | Admitting: Podiatry

## 2017-12-19 ENCOUNTER — Ambulatory Visit (INDEPENDENT_AMBULATORY_CARE_PROVIDER_SITE_OTHER): Payer: BLUE CROSS/BLUE SHIELD | Admitting: Podiatry

## 2017-12-19 ENCOUNTER — Ambulatory Visit (INDEPENDENT_AMBULATORY_CARE_PROVIDER_SITE_OTHER): Payer: BLUE CROSS/BLUE SHIELD

## 2017-12-19 DIAGNOSIS — M722 Plantar fascial fibromatosis: Secondary | ICD-10-CM | POA: Diagnosis not present

## 2017-12-19 DIAGNOSIS — M205X1 Other deformities of toe(s) (acquired), right foot: Secondary | ICD-10-CM | POA: Diagnosis not present

## 2017-12-19 NOTE — Patient Instructions (Signed)
Pre-Operative Instructions  Congratulations, you have decided to take an important step towards improving your quality of life.  You can be assured that the doctors and staff at [facility] will be with you every step of the way.  Here are some important things you should know:  1. Plan to be at the surgery center/hospital at least 1 (one) hour prior to your scheduled time, unless otherwise directed by the surgical center/hospital staff.  You must have a responsible adult accompany you, remain during the surgery and drive you home.  Make sure you have directions to the surgical center/hospital to ensure you arrive on time. 2. If you are having surgery at [facility] or [facility] hospitals, you will need a copy of your medical history and physical form from your family physician within one month prior to the date of surgery. We will give you a form for your primary physician to complete.  3. We make every effort to accommodate the date you request for surgery.  However, there are times where surgery dates or times have to be moved.  We will contact you as soon as possible if a change in schedule is required.   4. No aspirin/ibuprofen for one week before surgery.  If you are on aspirin, any non-steroidal anti-inflammatory medications (Mobic, Aleve, Ibuprofen) should not be taken seven (7) days prior to your surgery.  You make take Tylenol for pain prior to surgery.  5. Medications - If you are taking daily heart and blood pressure medications, seizure, reflux, allergy, asthma, anxiety, pain or diabetes medications, make sure you notify the surgery center/hospital before the day of surgery so they can tell you which medications you should take or avoid the day of surgery. 6. No food or drink after midnight the night before surgery unless directed otherwise by surgical center/hospital staff. 7. No alcoholic beverages 24-hours prior to surgery.  No smoking 24-hours prior or 24-hours after  surgery. 8. Wear loose pants or shorts. They should be loose enough to fit over bandages, boots, and casts. 9. Don't wear slip-on shoes. Sneakers are preferred. 10. Bring your boot with you to the surgery center/hospital.  Also bring crutches or a walker if your physician has prescribed it for you.  If you do not have this equipment, it will be provided for you after surgery. 11. If you have not been contacted by the surgery center/hospital by the day before your surgery, call to confirm the date and time of your surgery. 12. Leave-time from work may vary depending on the type of surgery you have.  Appropriate arrangements should be made prior to surgery with your employer. 13. Prescriptions will be provided immediately following surgery by your doctor.  Fill these as soon as possible after surgery and take the medication as directed. Pain medications will not be refilled on weekends and must be approved by the doctor. 14. Remove nail polish on the operative foot and avoid getting pedicures prior to surgery. 15. Wash the night before surgery.  The night before surgery wash the foot and leg well with water and the antibacterial soap provided. Be sure to pay special attention to beneath the toenails and in between the toes.  Wash for at least three (3) minutes. Rinse thoroughly with water and dry well with a towel.  Perform this wash unless told not to do so by your physician.  Enclosed: 1 Ice pack (please put in freezer the night before surgery)   1 Hibiclens skin cleaner     Pre-op instructions  If you have any questions regarding the instructions, please do not hesitate to call our office.  [place]: [address], [place], [place] [address] -- [phone]  [place]: [address] [place], [place] [address] -- [phone]  [place]: [address]  , [place] [address] -- [phone]  [place]: [address], [address], [place], [place] [address] -- [phone]  Website:  [email or web address] 

## 2017-12-20 NOTE — Progress Notes (Signed)
He presents today complaining of pain to the right lower extremity.  He states that this big toe and my fibroma like I had on my other foot have started to bother me to the point where it is affecting my ability to perform a daily activities and get around and work.  He states that he has to do a lot of squatting on his job and he states that it is very hard to do that and he started to feel pain in the contralateral hip and knee.  He denies any pain in his left foot which was previously corrected in the spring and summer but states that is doing much better.  He denies changes in his past medical history medications and allergies  Objective: Vital signs are stable he is alert and oriented x3 have reviewed his past medical history medications allergy surgeries and social history.  Pulses are strong and palpable right foot.  He has pain on range of motion and what appears to be a track bound arthritic first metatarsophalangeal joint with a lot of excessive eburnation and spurring.  He also has a very painful plantar fibroma to the medial most aspect of the medial band centrally located in the medial longitudinal arch.  It appears to be about 3 cm in length but only about a centimeter in width.  This does not appear to be associated with the dermis as of yet.  Otherwise the foot is moderately flat no open lesions or wounds no other major biomechanical issues.  Neurologic sensorium is normal.  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  Plan: Discussed etiology pathology conservative surgical therapies at this point because he is having significant pain with work will get a place to go ahead and place him out of work prior to surgery to allow this area to calm down before surgical intervention.  We consented him today for a Keller arthroplasty with a single silicone implant first metatarsal  phalangeal joint of the right foot and excision of plantar fibroma right foot.  He understands all of the possible's associated complications which we discussed previously but went over once again today consisting of postop pain bleeding swelling complications with surgery recurrence of the fibromas rejection of the implant postop pain bleeding swelling infection recurrence need for further surgery overcorrection under correction loss of digit loss of limb loss of life.  Dispensed information regarding the surgery also provided him with a new cam walker I will follow-up him prior to the holidays for surgical intervention.

## 2018-01-09 ENCOUNTER — Telehealth: Payer: Self-pay | Admitting: *Deleted

## 2018-01-09 NOTE — Telephone Encounter (Signed)
I am calling from Dr. Stephenie Acres office.  I see where you called me yesterday.  "Yes, everything is squared away.  I had not heard anything about my arrival time for Friday so I called you.  Then I called the surgery center and they told me what time to be there.  That's all I needed."

## 2018-01-10 ENCOUNTER — Other Ambulatory Visit: Payer: Self-pay | Admitting: Podiatry

## 2018-01-10 DIAGNOSIS — M79676 Pain in unspecified toe(s): Secondary | ICD-10-CM

## 2018-01-10 MED ORDER — CEPHALEXIN 500 MG PO CAPS: 500.0000 mg | ORAL_CAPSULE | Freq: Three times a day (TID) | ORAL | 0 refills | Status: DC

## 2018-01-10 MED ORDER — ONDANSETRON HCL 4 MG PO TABS: 4.0000 mg | ORAL_TABLET | Freq: Three times a day (TID) | ORAL | 0 refills | Status: DC | PRN

## 2018-01-10 MED ORDER — OXYCODONE-ACETAMINOPHEN 10-325 MG PO TABS: 1.0000 | ORAL_TABLET | Freq: Four times a day (QID) | ORAL | 0 refills | Status: AC | PRN

## 2018-01-12 ENCOUNTER — Encounter: Payer: Self-pay | Admitting: Podiatry

## 2018-01-12 DIAGNOSIS — M25571 Pain in right ankle and joints of right foot: Secondary | ICD-10-CM | POA: Diagnosis not present

## 2018-01-12 DIAGNOSIS — M21611 Bunion of right foot: Secondary | ICD-10-CM | POA: Diagnosis not present

## 2018-01-12 DIAGNOSIS — M720 Palmar fascial fibromatosis [Dupuytren]: Secondary | ICD-10-CM | POA: Diagnosis not present

## 2018-01-12 DIAGNOSIS — M205X1 Other deformities of toe(s) (acquired), right foot: Secondary | ICD-10-CM | POA: Diagnosis not present

## 2018-01-12 DIAGNOSIS — D2122 Benign neoplasm of connective and other soft tissue of left lower limb, including hip: Secondary | ICD-10-CM | POA: Diagnosis not present

## 2018-01-12 DIAGNOSIS — D492 Neoplasm of unspecified behavior of bone, soft tissue, and skin: Secondary | ICD-10-CM | POA: Diagnosis not present

## 2018-01-12 DIAGNOSIS — I10 Essential (primary) hypertension: Secondary | ICD-10-CM | POA: Diagnosis not present

## 2018-01-12 DIAGNOSIS — M2011 Hallux valgus (acquired), right foot: Secondary | ICD-10-CM | POA: Diagnosis not present

## 2018-01-12 MED FILL — CEPHALEXIN 500 MG CAPSULE: 500 | 10 days supply | Qty: 30 | Fill #0

## 2018-01-12 MED FILL — OXYCODONE-APAP 10-325: 10-325 | 7 days supply | Qty: 28 | Fill #0

## 2018-01-12 MED FILL — ONDANSETRON HCL 4 MG TABLET: 4 | 3 days supply | Qty: 9 | Fill #0

## 2018-01-19 ENCOUNTER — Ambulatory Visit (INDEPENDENT_AMBULATORY_CARE_PROVIDER_SITE_OTHER): Payer: BLUE CROSS/BLUE SHIELD

## 2018-01-19 ENCOUNTER — Encounter: Payer: Self-pay | Admitting: Podiatry

## 2018-01-19 ENCOUNTER — Ambulatory Visit (INDEPENDENT_AMBULATORY_CARE_PROVIDER_SITE_OTHER): Payer: BLUE CROSS/BLUE SHIELD | Admitting: Podiatry

## 2018-01-19 VITALS — BP 170/99 | HR 91 | Temp 97.1°F | Resp 16

## 2018-01-19 DIAGNOSIS — Z9889 Other specified postprocedural states: Secondary | ICD-10-CM

## 2018-01-19 DIAGNOSIS — M722 Plantar fascial fibromatosis: Secondary | ICD-10-CM

## 2018-01-19 DIAGNOSIS — M205X1 Other deformities of toe(s) (acquired), right foot: Secondary | ICD-10-CM

## 2018-01-19 MED ORDER — OXYCODONE-ACETAMINOPHEN 10-325 MG PO TABS: 1.0000 | ORAL_TABLET | Freq: Four times a day (QID) | ORAL | 0 refills | Status: DC | PRN

## 2018-01-19 MED FILL — OXYCODONE-APAP 10-325: 10-325 | 3 days supply | Qty: 15 | Fill #0

## 2018-01-23 NOTE — Progress Notes (Signed)
Subjective: Tony Walters is a 42 y.o. is seen today in office s/p right foot Keller bunionectomy with implant, right fibroma excision preformed on 01/12/2018 with Dr. Milinda Pointer.  He states it has been hurting but it has been getting better.  He is asking for refill pain medicine today.  He has been in the cam boot trying to stay off of his foot.  Denies any systemic complaints such as fevers, chills, nausea, vomiting. No calf pain, chest pain, shortness of breath.   His blood pressure is high today he has not yet taken his blood pressure medicine.  He has no other systemic effects of high blood pressure.  Objective: General: No acute distress, AAOx3  DP/PT pulses palpable 2/4, CRT < 3 sec to all digits.  Protective sensation intact. Motor function intact.  RIGHT foot: Incision is well coapted without any evidence of dehiscence with sutures intact. There is no surrounding erythema, ascending cellulitis, fluctuance, crepitus, malodor, drainage/purulence. There is mild edema around the surgical site. There is mild pain along the surgical site.  Incisions are healing well with any signs of infection No other areas of tenderness to bilateral lower extremities.  No other open lesions or pre-ulcerative lesions.  No pain with calf compression, swelling, warmth, erythema.   Assessment and Plan:  Status post right foot surgery, doing well with no complications   -Treatment options discussed including all alternatives, risks, and complications -X-rays were obtained and reviewed.  Status post first MPJ arthroplasty with implant. No evidence of acute fracture. -Antibiotic ointment and a bandage applied to the incisions.  He the dressing clean, dry, intact. -Remain in surgical boot. -Nonweightbearing -Ice/elevation -Pain medication as needed-refilled today -Monitor for any clinical signs or symptoms of infection and DVT/PE and directed to call the office immediately should any occur or go to the  ER. -Follow-up as scheduled with Dr. Milinda Pointer or sooner if any problems arise. In the meantime, encouraged to call the office with any questions, concerns, change in symptoms.   Celesta Gentile, DPM

## 2018-01-29 MED FILL — OXYCODONE-APAP 10-325: 10-325 | 3 days supply | Qty: 15 | Fill #0

## 2018-01-30 ENCOUNTER — Ambulatory Visit (INDEPENDENT_AMBULATORY_CARE_PROVIDER_SITE_OTHER): Payer: BLUE CROSS/BLUE SHIELD | Admitting: Podiatry

## 2018-01-30 DIAGNOSIS — M205X1 Other deformities of toe(s) (acquired), right foot: Secondary | ICD-10-CM | POA: Diagnosis not present

## 2018-01-30 DIAGNOSIS — M722 Plantar fascial fibromatosis: Secondary | ICD-10-CM

## 2018-01-30 DIAGNOSIS — Z9889 Other specified postprocedural states: Secondary | ICD-10-CM

## 2018-01-30 MED ORDER — OXYCODONE-ACETAMINOPHEN 10-325 MG PO TABS: 1.0000 | ORAL_TABLET | Freq: Four times a day (QID) | ORAL | 0 refills | Status: DC | PRN

## 2018-01-30 NOTE — Progress Notes (Signed)
He presents today about 2 weeks status post Keller bunionectomy and excision plantar fibroma right foot.  Date of surgery 01/12/2018.  He states that it throbs sometimes and the leg is sort of numb down the shin.  Objective: Vital signs are stable he is alert and oriented x3 dressed with dressing intact once removed demonstrates staples and sutures are intact good range of motion of the first metatarsophalangeal joint passively and actively.  He presented today with no cam walker because he was driving his car.  Assessment: Well-healing surgical foot.  Plan: Redressed today dressed a compressive dressing follow-up with him in 2 weeks for staple removal.  He is to remain nonweightbearing.  Also wrote another prescription for his pain medication.

## 2018-02-05 MED FILL — OXYCODONE-APAP 10-325: 10-325 | 3 days supply | Qty: 15 | Fill #0

## 2018-02-13 ENCOUNTER — Other Ambulatory Visit: Payer: BLUE CROSS/BLUE SHIELD

## 2018-02-15 ENCOUNTER — Ambulatory Visit (INDEPENDENT_AMBULATORY_CARE_PROVIDER_SITE_OTHER): Payer: BLUE CROSS/BLUE SHIELD | Admitting: Podiatry

## 2018-02-15 ENCOUNTER — Ambulatory Visit (INDEPENDENT_AMBULATORY_CARE_PROVIDER_SITE_OTHER): Payer: BLUE CROSS/BLUE SHIELD

## 2018-02-15 DIAGNOSIS — M205X1 Other deformities of toe(s) (acquired), right foot: Secondary | ICD-10-CM

## 2018-02-15 DIAGNOSIS — Z9889 Other specified postprocedural states: Secondary | ICD-10-CM

## 2018-02-15 DIAGNOSIS — M722 Plantar fascial fibromatosis: Secondary | ICD-10-CM

## 2018-02-15 NOTE — Progress Notes (Signed)
He presents today date of surgery 01/12/2018 status post Tony Walters bunionectomy with single silicone implant and excision of a plantar fibroma right states that my foot feels fine patient is walking in his postop shoe doing just fine denies fever chills nausea muscle aches pains calf pain back pain chest pain shortness of breath.  Objective: Vital signs are stable he is alert oriented x3 dressed her dressing intact was removed demonstrates no erythema edema cellulitis drainage or odor he has good range of motion of the first metatarsophalangeal joint staples and sutures are intact which were removed today.  Assessment: Well-healing surgical foot right.  Plan: Encourage range of motion exercises today recommend that he start washing the foot and cleaning it.  We will follow-up with him in about 2 weeks at which time I hope that he is in a pair of tennis shoes by then.

## 2018-02-27 ENCOUNTER — Ambulatory Visit (INDEPENDENT_AMBULATORY_CARE_PROVIDER_SITE_OTHER): Payer: BLUE CROSS/BLUE SHIELD

## 2018-02-27 ENCOUNTER — Ambulatory Visit (INDEPENDENT_AMBULATORY_CARE_PROVIDER_SITE_OTHER): Payer: Self-pay | Admitting: Podiatry

## 2018-02-27 DIAGNOSIS — M205X1 Other deformities of toe(s) (acquired), right foot: Secondary | ICD-10-CM

## 2018-02-27 DIAGNOSIS — Z9889 Other specified postprocedural states: Secondary | ICD-10-CM

## 2018-02-27 MED ORDER — METHYLPREDNISOLONE 4 MG PO TBPK: ORAL_TABLET | ORAL | 0 refills | Status: DC

## 2018-02-27 MED ORDER — OXYCODONE-ACETAMINOPHEN 10-325 MG PO TABS: 1.0000 | ORAL_TABLET | Freq: Four times a day (QID) | ORAL | 0 refills | Status: AC | PRN

## 2018-02-27 MED FILL — METHYLPREDNISOLONE 4 MG TAB: 4 | 6 days supply | Qty: 21 | Fill #0

## 2018-02-27 MED FILL — OXYCODONE-APAP 10-325: 10-325 | 7 days supply | Qty: 28 | Fill #0

## 2018-02-27 NOTE — Progress Notes (Signed)
He presents today date of surgery 01/12/2018 status post Tony Walters bunionectomy with implant right foot and excision plantar fibroma right foot.  States that he is having some pain around the incisional plantar aspect of the foot and the great toe is still stiff he states it seems to be little more swollen than previously noted and some of the numbness is still present.  He also relates that he is having some pain down the back of his calf and radiating up into his buttocks and sometimes it radiates from the buttocks to the foot.  Objective: Vital signs are stable alert and oriented x3.  Pulses are palpable.  He has good range of motion of the first metatarsophalangeal joint though it could be better.  Plantar aspect incision medial general arch is tight proximally and appears to be scarred down to the underlying soft tissue.  Otherwise his foot really looks good there is no signs of infection no open lesions no open wounds no signs of infection in general.  Assessment: Well-healing surgical foot he does have some neuralgias most likely associated with radiculopathy possibly associated with having worn the boot for so long with an abnormal gait that is causing some impingement.  Plan: Discussed etiology pathology conservative surgical therapies at the moment Sterapred Dosepak for the next few days to see if that will alleviate some of the pain in the foot hip and buttocks.  I also expressed to him that he should use more physical therapy range of motion exercises that he has been and that I will follow-up with him in 3 to 4 weeks.

## 2018-03-21 ENCOUNTER — Ambulatory Visit (INDEPENDENT_AMBULATORY_CARE_PROVIDER_SITE_OTHER): Payer: BLUE CROSS/BLUE SHIELD | Admitting: Family Medicine

## 2018-03-21 ENCOUNTER — Encounter: Payer: Self-pay | Admitting: Family Medicine

## 2018-03-21 VITALS — BP 152/100 | HR 74 | Temp 98.1°F | Ht 74.0 in | Wt 262.0 lb

## 2018-03-21 DIAGNOSIS — E039 Hypothyroidism, unspecified: Secondary | ICD-10-CM | POA: Diagnosis not present

## 2018-03-21 DIAGNOSIS — F524 Premature ejaculation: Secondary | ICD-10-CM | POA: Diagnosis not present

## 2018-03-21 DIAGNOSIS — Z0000 Encounter for general adult medical examination without abnormal findings: Secondary | ICD-10-CM | POA: Diagnosis not present

## 2018-03-21 MED ORDER — PAROXETINE HCL 10 MG PO TABS: 10.0000 mg | ORAL_TABLET | Freq: Every day | ORAL | 1 refills | Status: DC

## 2018-03-21 MED FILL — PARoxetine HCL 10 MG TABS: 10 | 30 days supply | Qty: 30 | Fill #0

## 2018-03-21 NOTE — Patient Instructions (Signed)
Give [place] 2-3 business days to get the results of your labs back.   Keep the diet clean and stay active.  Remember to take your blood pressure meds.   Let us know if you need anything.

## 2018-03-21 NOTE — Progress Notes (Signed)
Chief Complaint  Patient presents with  . Annual Exam   Well Male Tony Walters is here for a complete physical.   His last physical was >1 year ago.  Current diet: in general, a "good" diet.   Current exercise: none, recent surgery Weight trend: has gained a little Daytime fatigue? No. Seat belt? Yes.    Health maintenance Tetanus- Yes HIV- Yes  Past Medical History:  Diagnosis Date  . Chicken pox as a child  . Elevated BP 04/04/2013  . Grief reaction 04/04/2013  . HTN (hypertension) 04/04/2013  . PTSD (post-traumatic stress disorder)   . Unspecified hypothyroidism 04/07/2013     Past Surgical History:  Procedure Laterality Date  . HYDROCELE EXCISION / REPAIR  43 years old  . [place] EXTRACTION  2010    Medications  Current Outpatient Medications on File Prior to Visit  Medication Sig Dispense Refill  . hydrochlorothiazide (HYDRODIURIL) 25 MG tablet Take 1 tablet (25 mg total) by mouth daily. 30 tablet 6  . levothyroxine (SYNTHROID, LEVOTHROID) 50 MCG tablet Take 1 tablet (50 mcg total) by mouth daily. 30 tablet 3  . oxyCODONE-acetaminophen (PERCOCET) 10-325 MG tablet Take 1 tablet by mouth every 6 (six) hours as needed for pain. MAXIMUM TOTAL ACETAMINOPHEN DOSE IS 4000 MG PER DAY 15 tablet 0  . triamcinolone cream (KENALOG) 0.1 % Apply 1 application topically 2 (two) times daily. 30 g 2   Allergies No Known Allergies  Family History Family History  Problem Relation Age of Onset  . Congestive Heart Failure Father   . Hyperlipidemia Father   . Hypertension Father   . Kidney disease Maternal Grandmother   . Diabetes Maternal Grandmother        type 2  . Stroke Maternal Grandmother   . Alcohol abuse Paternal Grandmother   . Pneumonia Paternal Grandfather   . Asthma Brother     Review of Systems: Constitutional: no fevers or chills Eye:  no recent significant change in vision Ear/Nose/Mouth/Throat:  Ears:  no tinnitus or hearing  loss Nose/Mouth/Throat:  no complaints of nasal congestion, no sore throat Cardiovascular:  no chest pain, no palpitations Respiratory:  no cough and no shortness of breath Gastrointestinal:  no abdominal pain, no change in bowel habits GU:  Male: negative for dysuria, frequency, and incontinence and negative for prostate symptoms, +premature ejaculation Musculoskeletal/Extremities:  no pain, redness, or swelling of the joints Integumentary (Skin/Breast):  no abnormal skin lesions reported Neurologic:  no headaches, no numbness, tingling Endocrine: No unexpected weight changes Hematologic/Lymphatic:  no night sweats  Exam BP (!) 152/100 (BP Location: Left Arm, Patient Position: Sitting, Cuff Size: Large)   Pulse 74   Temp 98.1 F (36.7 C) (Oral)   Ht 6\' 2"  (1.88 m)   Wt 262 lb (118.8 kg)   SpO2 96%   BMI 33.64 kg/m  General:  well developed, well nourished, in no apparent distress Skin:  no significant moles, warts, or growths Head:  no masses, lesions, or tenderness Eyes:  pupils equal and round, sclera anicteric without injection Ears:  canals without lesions, TMs shiny without retraction, no obvious effusion, no erythema Nose:  nares patent, septum midline, mucosa normal Throat/Pharynx:  lips and gingiva without lesion; tongue and uvula midline; non-inflamed pharynx; no exudates or postnasal drainage Neck: neck supple without adenopathy, thyromegaly, or masses Lungs:  clear to auscultation, breath sounds equal bilaterally, no respiratory distress Cardio:  regular rate and rhythm, no bruits, no LE edema Abdomen:  abdomen soft,  nontender; bowel sounds normal; no masses or organomegaly Genital (male): circumcised penis, no lesions or discharge; testes present bilaterally without masses or tenderness Rectal: Deferred Musculoskeletal:  symmetrical muscle groups noted without atrophy or deformity Extremities:  no clubbing, cyanosis, or edema, no deformities, no skin  discoloration Neuro:  gait normal; deep tendon reflexes normal and symmetric Psych: well oriented with normal range of affect and appropriate judgment/insight  Assessment and Plan  Well adult exam - Plan: Lipid panel, Comprehensive metabolic panel, CBC  Acquired hypothyroidism - Plan: TSH  Premature ejaculation - Plan: PARoxetine (PAXIL) 10 MG tablet   Well 43 y.o. male. Counseled on diet and exercise. Counseled on risks and benefits of prostate cancer screening w psa, he opted to forego testing. Trial paroxetine for premature ejaculation. Offered counseling.  Take BP meds (forgot today due to travel). Other orders as above. Follow up in 1 mo. The patient voiced understanding and agreement to the plan.  [place], DO 03/21/18 3:23 PM

## 2018-03-22 ENCOUNTER — Other Ambulatory Visit (INDEPENDENT_AMBULATORY_CARE_PROVIDER_SITE_OTHER): Payer: BLUE CROSS/BLUE SHIELD

## 2018-03-22 DIAGNOSIS — E039 Hypothyroidism, unspecified: Secondary | ICD-10-CM | POA: Diagnosis not present

## 2018-03-22 LAB — COMPREHENSIVE METABOLIC PANEL
ALBUMIN: 4.6 g/dL (ref 3.5–5.2)
ALT: 49 U/L (ref 0–53)
AST: 30 U/L (ref 0–37)
Alkaline Phosphatase: 63 U/L (ref 39–117)
BILIRUBIN TOTAL: 0.6 mg/dL (ref 0.2–1.2)
BUN: 17 mg/dL (ref 6–23)
CO2: 28 mEq/L (ref 19–32)
CREATININE: 1.24 mg/dL (ref 0.40–1.50)
Calcium: 9.7 mg/dL (ref 8.4–10.5)
Chloride: 105 mEq/L (ref 96–112)
GFR: 76.5 mL/min (ref >60.00)
Glucose, Bld: 77 mg/dL (ref 70–99)
Potassium: 4.3 mEq/L (ref 3.5–5.1)
SODIUM: 141 meq/L (ref 135–145)
TOTAL PROTEIN: 7.2 g/dL (ref 6.0–8.3)

## 2018-03-22 LAB — CBC
HEMATOCRIT: 42.7 % (ref 39.0–52.0)
HEMOGLOBIN: 14.4 g/dL (ref 13.0–17.0)
MCHC: 33.7 g/dL (ref 30.0–36.0)
MCV: 84.1 fl (ref 78.0–100.0)
PLATELETS: 223 10*3/uL (ref 150.0–400.0)
RBC: 5.08 Mil/uL (ref 4.22–5.81)
RDW: 14.5 % (ref 11.5–15.5)
WBC: 10.2 10*3/uL (ref 4.0–10.5)

## 2018-03-22 LAB — TSH: TSH: 17.07 u[IU]/mL — AB (ref 0.35–4.50)

## 2018-03-22 LAB — LIPID PANEL
CHOLESTEROL: 177 mg/dL (ref 0–200)
HDL: 59.4 mg/dL (ref >39.00)
LDL Cholesterol: 102 mg/dL — ABNORMAL HIGH (ref 0–99)
NonHDL: 117.21
TRIGLYCERIDES: 75 mg/dL (ref 0.0–149.0)
Total CHOL/HDL Ratio: 3
VLDL: 15 mg/dL (ref 0.0–40.0)

## 2018-03-22 LAB — T4, FREE: Free T4: 0.58 ng/dL — ABNORMAL LOW (ref 0.60–1.60)

## 2018-03-27 ENCOUNTER — Encounter: Payer: Self-pay | Admitting: Podiatry

## 2018-03-27 ENCOUNTER — Telehealth: Payer: Self-pay | Admitting: *Deleted

## 2018-03-27 ENCOUNTER — Ambulatory Visit (INDEPENDENT_AMBULATORY_CARE_PROVIDER_SITE_OTHER): Payer: BLUE CROSS/BLUE SHIELD | Admitting: Podiatry

## 2018-03-27 ENCOUNTER — Ambulatory Visit (INDEPENDENT_AMBULATORY_CARE_PROVIDER_SITE_OTHER): Payer: BLUE CROSS/BLUE SHIELD

## 2018-03-27 DIAGNOSIS — M205X1 Other deformities of toe(s) (acquired), right foot: Secondary | ICD-10-CM

## 2018-03-27 DIAGNOSIS — M722 Plantar fascial fibromatosis: Secondary | ICD-10-CM

## 2018-03-27 DIAGNOSIS — Z9889 Other specified postprocedural states: Secondary | ICD-10-CM

## 2018-03-27 NOTE — Telephone Encounter (Signed)
Faxed order to [facility].

## 2018-03-27 NOTE — Telephone Encounter (Signed)
-----   Message from Rip Harbour, [facility] sent at 03/27/2018 11:25 AM EST ----- Regarding: PT referral PT - [place] off [address] - same place he went to for left foot/per Dr Milinda Pointer  S/p keller bunionectomy and excision plantar fibroma right - increase ROM, having numbness medial foot and lower leg   Duration: 3 x week x 4 weeks

## 2018-03-27 NOTE — Progress Notes (Signed)
He presents today status post Jake Michaelis bunion repair with implant right and excision plantar fibromas right he states that the foot is doing pretty good but he still has numbness along the anterior shin and medial calf.  He states that he still has some pain in the big toe but not nearly like it was.  He states that he is unable to squat or go up on his toes and feels that he cannot ambulate normally because of the numbness.  He states that he felt better after taking the steroid Dosepak that the neuropathy almost resolved.  Objective: Vital signs are stable alert oriented x3.  Pulses are palpable.  Neurologic sensorium seems to be diminished along the saphenous distribution.  Though I am concerned about the radiating pain that he has up into his buttocks and lower back.  He has stiffness on range of motion of the first metatarsophalangeal joint and the right plantar aspect of the foot appears to be healing normally.  Assessment: Well-healing surgical foot.  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  Plan: At this point he is going to remain out of work until further notice I am sending him to physical therapy for range of motion exercises and hopefully help with the neuritis.

## 2018-04-02 ENCOUNTER — Encounter: Payer: Self-pay | Admitting: Physical Therapy

## 2018-04-02 ENCOUNTER — Other Ambulatory Visit: Payer: Self-pay

## 2018-04-02 ENCOUNTER — Ambulatory Visit: Payer: BLUE CROSS/BLUE SHIELD | Attending: Podiatry | Admitting: Physical Therapy

## 2018-04-02 DIAGNOSIS — M6281 Muscle weakness (generalized): Secondary | ICD-10-CM | POA: Diagnosis not present

## 2018-04-02 DIAGNOSIS — M79671 Pain in right foot: Secondary | ICD-10-CM | POA: Diagnosis not present

## 2018-04-02 DIAGNOSIS — R262 Difficulty in walking, not elsewhere classified: Secondary | ICD-10-CM | POA: Diagnosis not present

## 2018-04-02 DIAGNOSIS — M25674 Stiffness of right foot, not elsewhere classified: Secondary | ICD-10-CM | POA: Insufficient documentation

## 2018-04-02 NOTE — Therapy (Signed)
[facility] [address]  [place], [place], [address] Phone: [phone]   Fax:  [phone]  Physical Therapy Evaluation  Patient Details  Name: Tony Walters MRN: [id number] Date of Birth: [birth year] Referring Provider (PT): Tyson Dense, MD   Encounter Date: 04/02/2018  PT End of Session - 04/02/18 0934    Visit Number  1    Number of Visits  13    Date for PT Re-Evaluation  05/14/18    Authorization Type  BCBS    PT Start Time  0815   pt late   PT Stop Time  0845    PT Time Calculation (min)  30 min    Activity Tolerance  Patient tolerated treatment well;Patient limited by pain    Behavior During Therapy  [facility] for tasks assessed/performed       Past Medical History:  Diagnosis Date  . Chicken pox as a child  . Elevated BP 04/04/2013  . Grief reaction 04/04/2013  . HTN (hypertension) 04/04/2013  . PTSD (post-traumatic stress disorder)   . Unspecified hypothyroidism 04/07/2013    Past Surgical History:  Procedure Laterality Date  . HYDROCELE EXCISION / REPAIR  43 years old  . WISDOM TOOTH EXTRACTION  2010    There were no vitals filed for this visit.   Subjective Assessment - 04/02/18 0817    Subjective  Patient reports undergoing R bunionectomy on 01/12/18. Reports average pain levels since the surgery are 3-5/10. Patient reports pain over dorsal and plantar surfaces, over incisions. Worse with standing, walking, sometimes sitting. Better with elevation and pain meds. Patient is out of work currently. Reports that he is still having swelling and reports little decreased sensation and tingling in R shin radiating to medial arch and with intermittent R buttock N/T. Started after surgery. Denies LBP besides a back "spasm in September."    Pertinent History  hypothyroidism, PTSD, HTN, elevated BP    Limitations  Sitting;Lifting    How long can you sit comfortably?  1 hour    How long can you stand  comfortably?  few minutes    How long can you walk comfortably?  few minutes    Patient Stated Goals  work on strengthening     Currently in Pain?  Yes    Pain Score  4     Pain Location  Foot   1st digit   Pain Orientation  Right    Pain Descriptors / Indicators  Discomfort    Pain Type  Chronic pain         [facility] PT Assessment - 04/02/18 0001      Assessment   Medical Diagnosis  Hallux limitus of R foot, planar fibromatosis, s/p R foot surgery    Referring Provider (PT)  Tyson Dense, MD    Onset Date/Surgical Date  01/13/19    Next MD Visit  03/35/20    Prior Therapy  Yes      Precautions   Precautions  None      Restrictions   Weight Bearing Restrictions  No      Balance Screen   Has the patient fallen in the past 6 months  No    Has the patient had a decrease in activity level because of a fear of falling?   No    Is the patient reluctant to leave their home because of a fear of falling?   No      Home Environment  Living Environment  Private residence    Living Arrangements  Alone      Prior Function   Level of Independence  Independent    Vocation  Full time employment    Vocation Requirements  standing, walking    Leisure  basketball      Cognition   Overall Cognitive Status  Within Functional Limits for tasks assessed      Observation/Other Assessments   Observations  well-healed R dorsal and plantar foot incisions; mild R great toe swelling    Focus on Therapeutic Outcomes (FOTO)   next session      Sensation   Light Touch  Impaired by gross assessment   decreased sensation over R medial and lateral shin     Coordination   Gross Motor Movements are Fluid and Coordinated  Yes      Posture/Postural Control   Posture/Postural Control  Postural limitations    Postural Limitations  Rounded Shoulders      ROM / Strength   AROM / PROM / Strength  AROM;Strength      AROM   AROM Assessment Site  Ankle;Other (comment)    Right/Left Ankle  Right;Left    R MTP flex 20 deg, ex 4 deg w/ moderate pain   Right Ankle Dorsiflexion  10   mild pain    Right Ankle Plantar Flexion  43   mild pain in calf   Right Ankle Inversion  32    Right Ankle Eversion  16    Left Ankle Dorsiflexion  10    Left Ankle Plantar Flexion  50    Left Ankle Inversion  38    Left Ankle Eversion  21      Strength   Strength Assessment Site  Hip;Knee;Ankle    Right/Left Hip  Right;Left    Right Hip Flexion  4+/5    Right Hip ABduction  4+/5    Right Hip ADduction  4/5    Left Hip Flexion  4+/5    Left Hip ABduction  4+/5    Left Hip ADduction  4/5    Right/Left Knee  Right;Left    Right Knee Flexion  4/5    Right Knee Extension  4/5    Left Knee Flexion  4+/5    Left Knee Extension  4+/5    Right/Left Ankle  Right;Left    Right Ankle Dorsiflexion  4/5    Right Ankle Plantar Flexion  3+/5    Right Ankle Inversion  4/5    Right Ankle Eversion  4/5    Left Ankle Dorsiflexion  4+/5    Left Ankle Plantar Flexion  4+/5    Left Ankle Inversion  4+/5    Left Ankle Eversion  4+/5      Palpation   Palpation comment  TTP adjecent to dorsal and plantar foot incisions on R; c/o tingling with palpation of tibialis anterior      Ambulation/Gait   Gait Pattern  Step-through pattern;Decreased weight shift to right;Decreased step length - left;Decreased stance time - right    Ambulation Surface  Level;Indoor    Gait velocity  slightly decreased                Objective measurements completed on examination: See above findings.              PT Education - 04/02/18 0934    Education Details  prognosis, POC, HEP    Person(s) Educated  Patient    Methods  Explanation;Demonstration;Tactile cues;Verbal cues;Handout    Comprehension  Verbalized understanding;Returned demonstration       PT Short Term Goals - 04/02/18 1035      PT SHORT TERM GOAL #1   Title  Patient to be independent with inital HEP.    Time  3    Period  Weeks    Status  New     Target Date  04/23/18        PT Long Term Goals - 04/02/18 1036      PT LONG TERM GOAL #1   Title  Patient to be independent with advanced HEP.    Time  6    Period  Weeks    Status  New    Target Date  05/14/18      PT LONG TERM GOAL #2   Title  Patient to report tolerance of 1.5 hours of walking without c/o pain.    Time  6    Period  Weeks    Status  New    Target Date  05/14/18      PT LONG TERM GOAL #3   Title  Patient to demonstrate [facility] R 1st MTP and ankle AROM to allow for efficient gait pattern.    Time  6    Period  Weeks    Status  New    Target Date  05/14/18      PT LONG TERM GOAL #4   Title  Patient to demonstrate >=4+/5 R LE strength.    Time  6    Period  Weeks    Status  New    Target Date  05/14/18      PT LONG TERM GOAL #5   Title  Patient to demonstrate reciprocal step-through pattern with use of 1 handrail as needed to climb up/down 1 flight of stairs to allow safe access to home.    Time  6    Period  Weeks    Status  New    Target Date  05/14/18             Plan - 04/02/18 0935    Clinical Impression Statement  Patient is a 43y/o M presenting to OPPT with c/o R foot pain s/p R bunionectomy w/ excision of plantar fibromas on 01/12/18. Worse with standing, walking, sometimes sitting. Patient also with report of N/T in R shin radiating to medial arch and intermittently to R buttock with onset after surgery. Patient today ambulating with slight gait deviations with B feet in tennis shoes. Demonstrated marked weakness in R LE, decreased and painful R ankle and great toe AROM, decreased sensation over R medial and lateral anterior lower leg, and TTP adjacent to dorsal and plantar foot incisions on R; c/o tingling with palpation of tibialis anterior. Educated patient on gentle stretching and strengthening HEP and advised not to push into pain- patient reported understanding. Would benefit from skilled PT services 2x/week for 6 weeks to address  aforementioned impairments and return to PLOF.     Personal Factors and Comorbidities  Past/Current Experience;Comorbidity 3+    Comorbidities  hypothyroidism, PTSD, HTN, elevated BP    Examination-Activity Limitations  Bathing;Sit;Sleep;Carry;Squat;Stairs;Stand;Lift;Transfers;Locomotion Level    Examination-Participation Restrictions  Church;Cleaning;Community Activity;Shop;Interpersonal Relationship;Yard Work;Laundry;Meal Prep    Stability/Clinical Decision Making  Evolving/Moderate complexity    Clinical Decision Making  Moderate    Rehab Potential  Good    PT Frequency  2x / week    PT Duration  6 weeks  PT Treatment/Interventions  ADLs/Self Care Home Management;Cryotherapy;Electrical Stimulation;Functional mobility training;Stair training;Gait training;Ultrasound;Moist Heat;Therapeutic activities;Therapeutic exercise;Balance training;Neuromuscular re-education;Patient/family education;Orthotic Fit/Training;Passive range of motion;Scar mobilization;Manual techniques;Dry needling;Energy conservation;Splinting;Taping;Vasopneumatic Device    PT Next Visit Plan  reassess HEP; FOTO    Consulted and Agree with Plan of Care  Patient       Patient will benefit from skilled therapeutic intervention in order to improve the following deficits and impairments:  Hypomobility, Impaired sensation, Increased edema, Decreased scar mobility, Decreased activity tolerance, Decreased strength, Pain, Increased fascial restricitons, Decreased balance, Difficulty walking, Improper body mechanics, Decreased range of motion, Postural dysfunction  Visit Diagnosis: Pain in right foot  Stiffness of right foot, not elsewhere classified  Muscle weakness (generalized)  Difficulty in walking, not elsewhere classified     Problem List Patient Active Problem List   Diagnosis Date Noted  . Premature ejaculation 03/21/2018  . Tobacco abuse 05/03/2017  . Depression with anxiety 05/09/2013  . Obesity, unspecified  04/07/2013  . Insomnia 04/07/2013  . Hypothyroidism 04/07/2013  . Essential hypertension 04/04/2013  . Grief reaction 04/04/2013     Janene Harvey, PT, DPT 04/02/18 10:39 AM   [facility] [address]  [place], [place], [address] Phone: [phone]   Fax:  [phone]  Name: Tony Walters MRN: [id number] Date of Birth: 09/04/1975

## 2018-04-04 ENCOUNTER — Ambulatory Visit: Payer: BLUE CROSS/BLUE SHIELD

## 2018-04-04 ENCOUNTER — Other Ambulatory Visit: Payer: Self-pay

## 2018-04-04 DIAGNOSIS — M79671 Pain in right foot: Secondary | ICD-10-CM | POA: Diagnosis not present

## 2018-04-04 DIAGNOSIS — R262 Difficulty in walking, not elsewhere classified: Secondary | ICD-10-CM | POA: Diagnosis not present

## 2018-04-04 DIAGNOSIS — M6281 Muscle weakness (generalized): Secondary | ICD-10-CM | POA: Diagnosis not present

## 2018-04-04 DIAGNOSIS — M25674 Stiffness of right foot, not elsewhere classified: Secondary | ICD-10-CM

## 2018-04-04 NOTE — Therapy (Signed)
[facility] [address]  [place], [place], [address] Phone: [phone]   Fax:  [phone]  Physical Therapy Treatment  Patient Details  Name: Tony Walters MRN: [id number] Date of Birth: 10-24-1975 Referring Provider (PT): Tyson Dense, MD   Encounter Date: 04/04/2018  PT End of Session - 04/04/18 0928    Visit Number  2    Number of Visits  13    Date for PT Re-Evaluation  05/14/18    Authorization Type  BCBS    PT Start Time  [phone]    PT Stop Time  1017    PT Time Calculation (min)  54 min    Activity Tolerance  Patient tolerated treatment well;Patient limited by pain    Behavior During Therapy  [facility] for tasks assessed/performed       Past Medical History:  Diagnosis Date  . Chicken pox as a child  . Elevated BP 04/04/2013  . Grief reaction 04/04/2013  . HTN (hypertension) 04/04/2013  . PTSD (post-traumatic stress disorder)   . Unspecified hypothyroidism 04/07/2013    Past Surgical History:  Procedure Laterality Date  . HYDROCELE EXCISION / REPAIR  43 years old  . WISDOM TOOTH EXTRACTION  2010    There were no vitals filed for this visit.  Subjective Assessment - 04/04/18 0924    Subjective  Pt. reporting discomfort in R glute/LE keeping him awake at night limiting his sleep quality.  Reports no issues with updated HEP.      Pertinent History  hypothyroidism, PTSD, HTN, elevated BP    Patient Stated Goals  work on strengthening     Currently in Pain?  Yes    Pain Score  4     Pain Location  Toe (Comment which one)   R great toe   Pain Orientation  Right    Pain Descriptors / Indicators  --   discomfort    Pain Type  Chronic pain    Pain Radiating Towards  Numbness/discomfort into R anterior shin, and discomfort into R glute    Aggravating Factors   Standing, walking short distances, sitting    Multiple Pain Sites  No         [facility] PT Assessment - 04/04/18 0001      Observation/Other  Assessments   Focus on Therapeutic Outcomes (FOTO)   32% (68% limitation)                   [facility] Adult PT Treatment/Exercise - 04/04/18 0951      Modalities   Modalities  Vasopneumatic      Vasopneumatic   Number Minutes Vasopneumatic   10 minutes    Vasopnuematic Location   Ankle   R   Vasopneumatic Pressure  Low    Vasopneumatic Temperature   Coldest temp.        Manual Therapy   Manual Therapy  Joint mobilization;Passive ROM    Manual therapy comments  supine     Joint Mobilization  R great toe distraction + AP/PA mobs for improved flexion/extension ROM     Passive ROM  Manual R great toe extension stretch       Ankle Exercises: Stretches   Gastroc Stretch  30 seconds;2 reps   Cues required to avoid overpressure for gentle stretch   Gastroc Stretch Limitations  leaning into wall     Other Stretch  Seated R great toe extension stretch 3 x 30 sec  Other Stretch  Seated R great toe extension stretch with heel slide under table to point of extension stretch 2 x 30 sec       Ankle Exercises: Aerobic   Recumbent Bike  Lvl 1, 6 min - cues to maximize R ankle AROM      Ankle Exercises: Seated   Heel Raises  20 reps;3 seconds    Heel Raises Limitations  UE pressure on knees     Toe Raise  20 reps;3 seconds    Toe Raise Limitations  UE pressure on knees     Other Seated Ankle Exercises  Seated arch creation 3" x 10 reps     Other Seated Ankle Exercises  Seated toe AROM R great toe extension 3" x 15 reps       Ankle Exercises: Supine   Other Supine Ankle Exercises  R ankle PF, DF, EV, IV with yellow TB therapist anchoring x 15 reps              PT Education - 04/04/18 1016    Education Details  HEP update    Person(s) Educated  Patient    Methods  Explanation;Demonstration;Verbal cues;Handout    Comprehension  Verbalized understanding;Returned demonstration;Verbal cues required;Need further instruction       PT Short Term Goals - 04/04/18 1937       PT SHORT TERM GOAL #1   Title  Patient to be independent with inital HEP.    Time  3    Period  Weeks    Status  On-going    Target Date  04/23/18        PT Long Term Goals - 04/04/18 9024      PT LONG TERM GOAL #1   Title  Patient to be independent with advanced HEP.    Time  6    Period  Weeks    Status  On-going      PT LONG TERM GOAL #2   Title  Patient to report tolerance of 1.5 hours of walking without c/o pain.    Time  6    Period  Weeks    Status  On-going      PT LONG TERM GOAL #3   Title  Patient to demonstrate [facility] R 1st MTP and ankle AROM to allow for efficient gait pattern.    Time  6    Period  Weeks    Status  On-going      PT LONG TERM GOAL #4   Title  Patient to demonstrate >=4+/5 R LE strength.    Time  6    Period  Weeks    Status  On-going      PT LONG TERM GOAL #5   Title  Patient to demonstrate reciprocal step-through pattern with use of 1 handrail as needed to climb up/down 1 flight of stairs to allow safe access to home.    Time  6    Period  Weeks    Status  On-going            Plan - 04/04/18 0928    Clinical Impression Statement  Tony Walters reporting HEP has gone well since last session.  Tolerated addition of seated arch creation, seated heel/toe raise, and gentle standing gastroc calf stretch well today.  Did require occasional cueing to avoid overpressure with stretches for improved tolerance.  HEP updated.  Ended visit with ice/compression to R foot/ankle as pt. still with visible swelling in this area.  Will  continue to progress toward goals in coming sessions.      Personal Factors and Comorbidities  Past/Current Experience;Comorbidity 3+    Comorbidities  hypothyroidism, PTSD, HTN, elevated BP    Examination-Activity Limitations  Bathing;Sit;Sleep;Carry;Squat;Stairs;Stand;Lift;Transfers;Locomotion Level    Examination-Participation Restrictions  Church;Cleaning;Community Activity;Shop;Interpersonal Relationship;Yard Work;Laundry;Meal  Prep    Stability/Clinical Decision Making  Evolving/Moderate complexity    Rehab Potential  Good    PT Treatment/Interventions  ADLs/Self Care Home Management;Cryotherapy;Electrical Stimulation;Functional mobility training;Stair training;Gait training;Ultrasound;Moist Heat;Therapeutic activities;Therapeutic exercise;Balance training;Neuromuscular re-education;Patient/family education;Orthotic Fit/Training;Passive range of motion;Scar mobilization;Manual techniques;Dry needling;Energy conservation;Splinting;Taping;Vasopneumatic Device    PT Next Visit Plan  Monitor tolerance to updated HEP     Consulted and Agree with Plan of Care  Patient       Patient will benefit from skilled therapeutic intervention in order to improve the following deficits and impairments:  Hypomobility, Impaired sensation, Increased edema, Decreased scar mobility, Decreased activity tolerance, Decreased strength, Pain, Increased fascial restricitons, Decreased balance, Difficulty walking, Improper body mechanics, Decreased range of motion, Postural dysfunction  Visit Diagnosis: Pain in right foot  Stiffness of right foot, not elsewhere classified  Muscle weakness (generalized)  Difficulty in walking, not elsewhere classified     Problem List Patient Active Problem List   Diagnosis Date Noted  . Premature ejaculation 03/21/2018  . Tobacco abuse 05/03/2017  . Depression with anxiety 05/09/2013  . Obesity, unspecified 04/07/2013  . Insomnia 04/07/2013  . Hypothyroidism 04/07/2013  . Essential hypertension 04/04/2013  . Grief reaction 04/04/2013    Bess Harvest, PTA 04/04/18 10:25 AM   [facility] [address]  [place], [place], [address] Phone: [phone]   Fax:  [phone]  Name: Tony Walters MRN: [id number] Date of Birth: 1975-08-27

## 2018-04-10 ENCOUNTER — Ambulatory Visit: Payer: BLUE CROSS/BLUE SHIELD | Admitting: Physical Therapy

## 2018-04-10 ENCOUNTER — Encounter: Payer: Self-pay | Admitting: Physical Therapy

## 2018-04-10 ENCOUNTER — Other Ambulatory Visit: Payer: Self-pay

## 2018-04-10 DIAGNOSIS — R262 Difficulty in walking, not elsewhere classified: Secondary | ICD-10-CM

## 2018-04-10 DIAGNOSIS — M79671 Pain in right foot: Secondary | ICD-10-CM

## 2018-04-10 DIAGNOSIS — M6281 Muscle weakness (generalized): Secondary | ICD-10-CM | POA: Diagnosis not present

## 2018-04-10 DIAGNOSIS — M25674 Stiffness of right foot, not elsewhere classified: Secondary | ICD-10-CM | POA: Diagnosis not present

## 2018-04-10 NOTE — Therapy (Signed)
[facility] [address]  [place], [place], [address] Phone: [phone]   Fax:  [phone]  Physical Therapy Treatment  Patient Details  Name: Tony Walters MRN: [id number] Date of Birth: [birth year] Referring Provider (PT): Tyson Dense, MD   Encounter Date: 04/10/2018  PT End of Session - 04/10/18 0922    Visit Number  3    Number of Visits  13    Date for PT Re-Evaluation  05/14/18    Authorization Type  BCBS    PT Start Time  0846    PT Stop Time  0936    PT Time Calculation (min)  50 min    Activity Tolerance  Patient tolerated treatment well    Behavior During Therapy  [facility] for tasks assessed/performed       Past Medical History:  Diagnosis Date  . Chicken pox as a child  . Elevated BP 04/04/2013  . Grief reaction 04/04/2013  . HTN (hypertension) 04/04/2013  . PTSD (post-traumatic stress disorder)   . Unspecified hypothyroidism 04/07/2013    Past Surgical History:  Procedure Laterality Date  . HYDROCELE EXCISION / REPAIR  43 years old  . WISDOM TOOTH EXTRACTION  2010    There were no vitals filed for this visit.  Subjective Assessment - 04/10/18 0848    Subjective  Reports that he is having some pain in his big toe this AM.     Pertinent History  hypothyroidism, PTSD, HTN, elevated BP    Patient Stated Goals  work on strengthening     Currently in Pain?  Yes    Pain Score  6     Pain Location  Toe (Comment which one)   1st digit   Pain Orientation  Right    Pain Descriptors / Indicators  Throbbing    Pain Type  Chronic pain                       [facility] Adult PT Treatment/Exercise - 04/10/18 0001      Self-Care   Self-Care  Other Self-Care Comments    Other Self-Care Comments   edu and practice of R plantar foot massage with ball to tolerance      Exercises   Exercises  Ankle;Lumbar      Lumbar Exercises: Quadruped   Madcat/Old Horse  10 reps    Madcat/Old Horse  Limitations  cues for posterior pelvic tilt    Other Quadruped Lumbar Exercises  child pose rocking 10x5"      Vasopneumatic   Number Minutes Vasopneumatic   15 minutes    Vasopnuematic Location   Ankle   R   Vasopneumatic Pressure  Low    Vasopneumatic Temperature   Coldest temp.        Ankle Exercises: Aerobic   Nustep  L3 x 52min LEs only      Ankle Exercises: Stretches   Soleus Stretch  1 rep;30 seconds   longsitting with strap   Gastroc Stretch  1 rep;30 seconds   longsitting with strap     Ankle Exercises: Seated   ABC's Limitations  R great toe extension 2x15    Ankle Circles/Pumps  Strengthening;Right;20 reps    Ankle Circles/Pumps Limitations  4 way ankle with yellow TB x20 each direction   mild pain in ankle with inversion   Heel Raises  Both;20 reps    Heel Raises Limitations  2x20 with 7lbs over knees  Toe Raise  20 reps    Toe Raise Limitations  R LE 2x20     Other Seated Ankle Exercises  Seated arch creation 2 sets 5"x10 reps                PT Short Term Goals - 04/04/18 3474      PT SHORT TERM GOAL #1   Title  Patient to be independent with inital HEP.    Time  3    Period  Weeks    Status  On-going    Target Date  04/23/18        PT Long Term Goals - 04/04/18 2595      PT LONG TERM GOAL #1   Title  Patient to be independent with advanced HEP.    Time  6    Period  Weeks    Status  On-going      PT LONG TERM GOAL #2   Title  Patient to report tolerance of 1.5 hours of walking without c/o pain.    Time  6    Period  Weeks    Status  On-going      PT LONG TERM GOAL #3   Title  Patient to demonstrate [facility] R 1st MTP and ankle AROM to allow for efficient gait pattern.    Time  6    Period  Weeks    Status  On-going      PT LONG TERM GOAL #4   Title  Patient to demonstrate >=4+/5 R LE strength.    Time  6    Period  Weeks    Status  On-going      PT LONG TERM GOAL #5   Title  Patient to demonstrate reciprocal step-through pattern  with use of 1 handrail as needed to climb up/down 1 flight of stairs to allow safe access to home.    Time  6    Period  Weeks    Status  On-going            Plan - 04/10/18 6387    Clinical Impression Statement  Patient arrived to session with report of slightly increased R 1st digit pain this AM without known cause. Able to perform 4 way ankle with light banded resistance with good form and no compensations. Demonstrated decreased ROM with sitting heel raises today. Reporting pain with sitting great toe extension, but tolerable. Educated patient on plantar foot massage with ball for pain relief and improved scar mobility. Introduced gentle lumbopelvic stretching to identify if patient's N/T may be coming from spine, however patient denying change in symptoms with these exercises. Ended session with [place] to R ankle for pain relief. Patient without complaints at end of session.     Comorbidities  hypothyroidism, PTSD, HTN, elevated BP    PT Treatment/Interventions  ADLs/Self Care Home Management;Cryotherapy;Electrical Stimulation;Functional mobility training;Stair training;Gait training;Ultrasound;Moist Heat;Therapeutic activities;Therapeutic exercise;Balance training;Neuromuscular re-education;Patient/family education;Orthotic Fit/Training;Passive range of motion;Scar mobilization;Manual techniques;Dry needling;Energy conservation;Splinting;Taping;Vasopneumatic Device    PT Next Visit Plan  Monitor tolerance to updated HEP     Consulted and Agree with Plan of Care  Patient       Patient will benefit from skilled therapeutic intervention in order to improve the following deficits and impairments:  Hypomobility, Impaired sensation, Increased edema, Decreased scar mobility, Decreased activity tolerance, Decreased strength, Pain, Increased fascial restricitons, Decreased balance, Difficulty walking, Improper body mechanics, Decreased range of motion, Postural dysfunction  Visit  Diagnosis: Pain in right foot  Stiffness  of right foot, not elsewhere classified  Muscle weakness (generalized)  Difficulty in walking, not elsewhere classified     Problem List Patient Active Problem List   Diagnosis Date Noted  . Premature ejaculation 03/21/2018  . Tobacco abuse 05/03/2017  . Depression with anxiety 05/09/2013  . Obesity, unspecified 04/07/2013  . Insomnia 04/07/2013  . Hypothyroidism 04/07/2013  . Essential hypertension 04/04/2013  . Grief reaction 04/04/2013     Janene Harvey, PT, DPT 04/10/18 10:45 AM   [facility] [address]  [place], [place], [address] Phone: [phone]   Fax:  [phone]  Name: Tony Walters MRN: [id number] Date of Birth: [birth year]

## 2018-04-13 ENCOUNTER — Ambulatory Visit: Payer: BLUE CROSS/BLUE SHIELD | Admitting: Physical Therapy

## 2018-04-16 ENCOUNTER — Encounter: Payer: Self-pay | Admitting: Family Medicine

## 2018-04-16 ENCOUNTER — Other Ambulatory Visit: Payer: Self-pay

## 2018-04-16 ENCOUNTER — Ambulatory Visit: Payer: BLUE CROSS/BLUE SHIELD | Admitting: Family Medicine

## 2018-04-16 VITALS — BP 142/86 | HR 87 | Temp 98.0°F | Ht 74.0 in | Wt 262.5 lb

## 2018-04-16 DIAGNOSIS — I10 Essential (primary) hypertension: Secondary | ICD-10-CM | POA: Diagnosis not present

## 2018-04-16 MED ORDER — HYDROCHLOROTHIAZIDE 25 MG PO TABS: 25.0000 mg | ORAL_TABLET | Freq: Every day | ORAL | 1 refills | Status: DC

## 2018-04-16 NOTE — Progress Notes (Signed)
Chief Complaint  Patient presents with  . Follow-up    Subjective Tony Walters is a 43 y.o. male who presents for hypertension follow up. He does monitor home blood pressures. Blood pressures ranging from 140's/80's on average. He ran out of BP medication. Was rx'd HCTZ 25 mg/d. Patient has these side effects of medication: none He is adhering to a healthy diet overall. Current exercise: walking dog   Past Medical History:  Diagnosis Date  . Chicken pox as a child  . Elevated BP 04/04/2013  . Grief reaction 04/04/2013  . HTN (hypertension) 04/04/2013  . PTSD (post-traumatic stress disorder)   . Unspecified hypothyroidism 04/07/2013    Review of Systems Cardiovascular: no chest pain Respiratory:  no shortness of breath  Exam BP (!) 142/86 (BP Location: Left Arm, Patient Position: Sitting, Cuff Size: Large)   Pulse 87   Temp 98 F (36.7 C) (Oral)   Ht 6\' 2"  (1.88 m)   Wt 262 lb 8 oz (119.1 kg)   SpO2 96%   BMI 33.70 kg/m  General:  well developed, well nourished, in no apparent distress Heart: RRR, no bruits, no LE edema Lungs: clear to auscultation, no accessory muscle use Psych: well oriented with normal range of affect and appropriate judgment/insight  Essential hypertension - Plan: hydrochlorothiazide (HYDRODIURIL) 25 MG tablet  Orders as above. Will provide written copy as well. Counseled on diet and exercise. Stop smoking. Ck BP at home. F/u in 1 mo. The patient voiced understanding and agreement to the plan.  Dyess, DO 04/16/18  7:59 AM

## 2018-04-16 NOTE — Patient Instructions (Signed)
Keep the diet clean and stay active.  Keep checking your BP at home.  Stop smoking.  Let us know if you need anything.

## 2018-04-17 ENCOUNTER — Ambulatory Visit: Payer: BLUE CROSS/BLUE SHIELD | Admitting: Physical Therapy

## 2018-04-20 ENCOUNTER — Ambulatory Visit: Payer: BLUE CROSS/BLUE SHIELD

## 2018-04-24 ENCOUNTER — Ambulatory Visit (INDEPENDENT_AMBULATORY_CARE_PROVIDER_SITE_OTHER): Payer: BLUE CROSS/BLUE SHIELD

## 2018-04-24 ENCOUNTER — Other Ambulatory Visit: Payer: Self-pay

## 2018-04-24 ENCOUNTER — Ambulatory Visit: Payer: BLUE CROSS/BLUE SHIELD

## 2018-04-24 ENCOUNTER — Ambulatory Visit (INDEPENDENT_AMBULATORY_CARE_PROVIDER_SITE_OTHER): Payer: BLUE CROSS/BLUE SHIELD | Admitting: Podiatry

## 2018-04-24 VITALS — Temp 97.2°F

## 2018-04-24 DIAGNOSIS — Z9889 Other specified postprocedural states: Secondary | ICD-10-CM

## 2018-04-24 DIAGNOSIS — M205X1 Other deformities of toe(s) (acquired), right foot: Secondary | ICD-10-CM | POA: Diagnosis not present

## 2018-04-24 DIAGNOSIS — M79671 Pain in right foot: Secondary | ICD-10-CM | POA: Diagnosis not present

## 2018-04-24 DIAGNOSIS — M25674 Stiffness of right foot, not elsewhere classified: Secondary | ICD-10-CM | POA: Diagnosis not present

## 2018-04-24 DIAGNOSIS — R262 Difficulty in walking, not elsewhere classified: Secondary | ICD-10-CM | POA: Diagnosis not present

## 2018-04-24 DIAGNOSIS — M722 Plantar fascial fibromatosis: Secondary | ICD-10-CM

## 2018-04-24 DIAGNOSIS — M6281 Muscle weakness (generalized): Secondary | ICD-10-CM | POA: Diagnosis not present

## 2018-04-24 MED ORDER — HYDROCODONE-ACETAMINOPHEN 5-325 MG PO TABS: 1.0000 | ORAL_TABLET | ORAL | 0 refills | Status: DC | PRN

## 2018-04-24 MED ORDER — HYDROCODONE-ACETAMINOPHEN 5-325 MG PO TABS: 1.0000 | ORAL_TABLET | ORAL | 0 refills | Status: AC | PRN

## 2018-04-24 MED FILL — PARoxetine HCL 10 MG TABS: 10 | 30 days supply | Qty: 30 | Fill #1

## 2018-04-24 MED FILL — HYDROCHLOROTHIAZIDE 25 MG T: 25 | 30 days supply | Qty: 30 | Fill #0

## 2018-04-24 MED FILL — HYDROCODON-APAP 5-325: 5-325 | 3 days supply | Qty: 15 | Fill #0

## 2018-04-24 NOTE — Progress Notes (Addendum)
Subjective: Tony Walters is a 43 y.o. is seen today in office s/p right foot plantar fibroma excision as well as pillar implant bunionectomy preformed on 01/11/18.  Overall he states he still any discomfort is been going to physical therapy.  He does not feel he is ready go back to work.  He states that he has stiffness to his big toe joint.  Also he states that he has continued numbness to his leg after having a nerve block.  He states that he feels this is the nerve block because he does not have the same symptoms the left side when it was done and is complaining more of his symptoms to the leg.  This is been an ongoing issue for him.  Denies any systemic complaints such as fevers, chills, nausea, vomiting. No calf pain, chest pain, shortness of breath.   Objective: General: No acute distress, AAOx3  DP/PT pulses palpable 2/4, CRT < 3 sec to all digits.  Protective sensation intact. Motor function intact.  RIGHT foot: Incision is well coapted without any evidence of dehiscence. There is no surrounding erythema, ascending cellulitis, fluctuance, crepitus, malodor, drainage/purulence. There is mild edema around the surgical site. There is mild pain along the surgical site.  Along the plantar incision on the distal aspect there is a small area of fibroma which remains at this point he is tenderness as well.  There is no fluctuation or crepitation or any signs of infection.  Again the majority of his issues he thinks is coming from nerve pain to his legs.  He does feels overall getting somewhat better. No other areas of tenderness to bilateral lower extremities.  No other open lesions or pre-ulcerative lesions.  No pain with calf compression, swelling, warmth, erythema.   Assessment and Plan:  Status post right foot surgery with continued, but slowly improving pain  -Treatment options discussed including all alternatives, risks, and complications -X-rays were obtained and reviewed.  Narrowing of  the first MPJ.  No evidence of acute fracture. -Regards to his pain the majority of symptoms appear to be due to decreased range of motion the first MPJ.  Wearing continue physical therapy for this.  Discussed orthotics primas to hold off for now.  Regards to the plantar incision I prescribed a compound cream today through [facility] to include verapamil.  Will discuss with anesthesia the neuritis symptoms. -Ice/elevation -Pain medication as needed. -Monitor for any clinical signs or symptoms of infection and DVT/PE and directed to call the office immediately should any occur or go to the ER. -Follow-up as scheduled with Dr. Milinda Pointer or sooner if any problems arise. In the meantime, encouraged to call the office with any questions, concerns, change in symptoms.   Celesta Gentile, DPM  Spoke to Dr. Ouida Sills at [facility] about nerve block issues. He is going to contact the patient.

## 2018-04-24 NOTE — Therapy (Signed)
[facility] [address]  [place], [place], [address] Phone: [phone]   Fax:  [phone]  Physical Therapy Treatment  Patient Details  Name: Tony Walters MRN: [id number] Date of Birth: 19-Oct-1975 Referring Provider (PT): Tyson Dense, MD   Encounter Date: 04/24/2018  PT End of Session - 04/24/18 0851    Visit Number  4    Number of Visits  13    Date for PT Re-Evaluation  05/14/18    Authorization Type  BCBS    PT Start Time  0845    PT Stop Time  0950    PT Time Calculation (min)  65 min    Activity Tolerance  Patient tolerated treatment well    Behavior During Therapy  [facility] for tasks assessed/performed       Past Medical History:  Diagnosis Date  . Chicken pox as a child  . Elevated BP 04/04/2013  . Grief reaction 04/04/2013  . HTN (hypertension) 04/04/2013  . PTSD (post-traumatic stress disorder)   . Unspecified hypothyroidism 04/07/2013    Past Surgical History:  Procedure Laterality Date  . HYDROCELE EXCISION / REPAIR  43 years old  . WISDOM TOOTH EXTRACTION  2010    There were no vitals filed for this visit.  Subjective Assessment - 04/24/18 0848    Subjective  Pt. noting R toe pain still rising to 7/10 at worst while dressing this morning.      Pertinent History  hypothyroidism, PTSD, HTN, elevated BP    How long can you sit comfortably?  not limited    How long can you stand comfortably?  30 min     How long can you walk comfortably?  an hour on level surface    Patient Stated Goals  work on strengthening     Currently in Pain?  Yes    Pain Score  3    pain rising to 7/10 at worst    Pain Location  Toe (Comment which one)   1st digit   Pain Orientation  Right    Pain Descriptors / Indicators  Throbbing    Pain Type  Chronic pain         [facility] PT Assessment - 04/24/18 0001      AROM   Right/Left Ankle  Right;Left    Right Ankle Dorsiflexion  11    Right Ankle Plantar Flexion   50    Right Ankle Inversion  40    Right Ankle Eversion  23    Left Ankle Dorsiflexion  10    Left Ankle Plantar Flexion  55    Left Ankle Inversion  38    Left Ankle Eversion  21      Strength   Strength Assessment Site  Hip;Knee;Ankle    Right/Left Hip  Right;Left    Right Hip Flexion  4+/5    Right Hip ABduction  4+/5    Right Hip ADduction  4+/5    Left Hip Flexion  4+/5    Left Hip ABduction  5/5    Left Hip ADduction  4+/5    Right/Left Knee  Right;Left    Right Knee Flexion  5/5    Right Knee Extension  5/5    Left Knee Flexion  5/5    Left Knee Extension  5/5    Right/Left Ankle  Right;Left    Right Ankle Dorsiflexion  4+/5    Right Ankle Inversion  4+/5  Right Ankle Eversion  4+/5    Left Ankle Dorsiflexion  4+/5    Left Ankle Inversion  4+/5    Left Ankle Eversion  4+/5                   [facility] Adult PT Treatment/Exercise - 04/24/18 0916      Ambulation/Gait   Stairs  Yes    Stairs Assistance  6: Modified independent (Device/Increase time)    Stair Management Technique  No rails;Alternating pattern    Number of Stairs  14    Height of Stairs  7    Gait Comments  Pt. able to ascend/descend stairs reciprocally however with noted hip ER       Lumbar Exercises: Standing   Heel Raises  --      Manual Therapy   Manual Therapy  Joint mobilization;Passive ROM    Manual therapy comments  supine     Joint Mobilization  R ankle talocrural mobs for improved ROM       Ankle Exercises: Stretches   Plantar Fascia Stretch  2 reps;30 seconds    Plantar Fascia Stretch Limitations  + R great toe stretch leaning into wall     Soleus Stretch  30 seconds;2 reps    Gastroc Stretch  30 seconds;2 reps      Ankle Exercises: Aerobic   Nustep  L3 x 35mn LEs only      Ankle Exercises: Standing   Vector Stance  Right;3 reps;15 seconds    Vector Stance Limitations  "clocks" at counter; intermittent UE support     SLS  R 3 x 15" at counter     Heel Raises  15  reps;Both             PT Education - 04/24/18 0958    Education Details  HEP update    Person(s) Educated  Patient    Methods  Explanation;Demonstration;Verbal cues;Handout    Comprehension  Verbalized understanding;Returned demonstration;Verbal cues required;Need further instruction       PT Short Term Goals - 04/24/18 0920      PT SHORT TERM GOAL #1   Title  Patient to be independent with inital HEP.    Time  3    Period  Weeks    Status  Achieved    Target Date  04/23/18        PT Long Term Goals - 04/24/18 0925      PT LONG TERM GOAL #1   Title  Patient to be independent with advanced HEP.    Time  6    Period  Weeks    Status  Partially Met      PT LONG TERM GOAL #2   Title  Patient to report tolerance of 1.5 hours of walking without c/o pain.    Time  6    Period  Weeks    Status  Partially Met      PT LONG TERM GOAL #3   Title  Patient to demonstrate [facility] 1[facility] MTP and ankle AROM to allow for efficient gait pattern.    Time  6    Period  Weeks    Status  Partially Met      PT LONG TERM GOAL #4   Title  Patient to demonstrate >=4+/5 R LE strength.    Time  6    Period  Weeks    Status  Achieved      PT LONG TERM GOAL #5  Title  Patient to demonstrate reciprocal step-through pattern with use of 1 handrail as needed to climb up/down 1 flight of stairs to allow safe access to home.    Time  6    Period  Weeks    Status  Achieved            Plan - 04/24/18 8588    Clinical Impression Statement  Pt. has made good progress with therapy.  Able to achieve STG #1 now independent with HEP.  Able to partially achieve LTG #2 today reporting able to ambulate for one hour without increased pain from baseline 3/10 level.  Able to demo improved R ankle AROM in all motions today partially achieving LTG #3 however still showing greatest deficit in AROM DF.  MT focusing on R ankle talocrural joint mobs and therex focused on standing calf/R great toe extension  stretching for improved ROM.  HEP updated to include additional standing calf stretches today.  Pt. able to achieve LTG #4 today demonstrating R LE strength with MMT of 4+/5 strength or greater.  Pt. still demonstrating R ankle stability deficits with dynamic SLS activities in session today.  Able to navigate stairs reciprocally today achieving LTG #5 however with R hip ER compensation descending likely due to limited R ankle DF.  Pt. will continue to benefit from further skilled therapy to maximize functional mobility and walking tolerance.      Personal Factors and Comorbidities  Past/Current Experience;Comorbidity 3+    Comorbidities  hypothyroidism, PTSD, HTN, elevated BP    Examination-Activity Limitations  Bathing;Sit;Sleep;Carry;Squat;Stairs;Stand;Lift;Transfers;Locomotion Level    Examination-Participation Restrictions  Church;Cleaning;Community Activity;Shop;Interpersonal Relationship;Yard Work;Laundry;Meal Prep    Stability/Clinical Decision Making  Evolving/Moderate complexity    Rehab Potential  Good    PT Treatment/Interventions  ADLs/Self Care Home Management;Cryotherapy;Electrical Stimulation;Functional mobility training;Stair training;Gait training;Ultrasound;Moist Heat;Therapeutic activities;Therapeutic exercise;Balance training;Neuromuscular re-education;Patient/family education;Orthotic Fit/Training;Passive range of motion;Scar mobilization;Manual techniques;Dry needling;Energy conservation;Splinting;Taping;Vasopneumatic Device    PT Next Visit Plan  Monitor tolerance to updated HEP     Consulted and Agree with Plan of Care  Patient       Patient will benefit from skilled therapeutic intervention in order to improve the following deficits and impairments:  Hypomobility, Impaired sensation, Increased edema, Decreased scar mobility, Decreased activity tolerance, Decreased strength, Pain, Increased fascial restricitons, Decreased balance, Difficulty walking, Improper body mechanics,  Decreased range of motion, Postural dysfunction  Visit Diagnosis: Pain in right foot  Stiffness of right foot, not elsewhere classified  Muscle weakness (generalized)  Difficulty in walking, not elsewhere classified     Problem List Patient Active Problem List   Diagnosis Date Noted  . Premature ejaculation 03/21/2018  . Tobacco abuse 05/03/2017  . Depression with anxiety 05/09/2013  . Obesity, unspecified 04/07/2013  . Insomnia 04/07/2013  . Hypothyroidism 04/07/2013  . Essential hypertension 04/04/2013  . Grief reaction 04/04/2013    Bess Harvest, PTA 04/24/18 10:09 AM   [facility] [address]  [place], [place], [address] Phone: [phone]   Fax:  [phone]  Name: Tony Walters MRN: [id number] Date of Birth: 1975/02/21

## 2018-04-27 ENCOUNTER — Encounter: Payer: Self-pay | Admitting: Physical Therapy

## 2018-04-27 ENCOUNTER — Other Ambulatory Visit: Payer: Self-pay

## 2018-04-27 ENCOUNTER — Ambulatory Visit: Payer: BLUE CROSS/BLUE SHIELD

## 2018-04-27 ENCOUNTER — Ambulatory Visit: Payer: BLUE CROSS/BLUE SHIELD | Attending: Podiatry | Admitting: Physical Therapy

## 2018-04-27 DIAGNOSIS — M79671 Pain in right foot: Secondary | ICD-10-CM | POA: Diagnosis not present

## 2018-04-27 DIAGNOSIS — R262 Difficulty in walking, not elsewhere classified: Secondary | ICD-10-CM

## 2018-04-27 DIAGNOSIS — M6281 Muscle weakness (generalized): Secondary | ICD-10-CM | POA: Diagnosis not present

## 2018-04-27 DIAGNOSIS — M25674 Stiffness of right foot, not elsewhere classified: Secondary | ICD-10-CM | POA: Diagnosis not present

## 2018-04-27 NOTE — Therapy (Signed)
[facility] [address]  [place], [place], [address] Phone: [phone]   Fax:  [phone]  Physical Therapy Treatment  Patient Details  Name: Tony Walters MRN: [id number] Date of Birth: 22-Nov-1975 Referring Provider (PT): Tyson Dense, MD   Encounter Date: 04/27/2018  PT End of Session - 04/27/18 0942    Visit Number  5    Number of Visits  13    Date for PT Re-Evaluation  05/14/18    Authorization Type  BCBS    PT Start Time  [phone]    PT Stop Time  0932    PT Time Calculation (min)  43 min    Activity Tolerance  Patient tolerated treatment well;Patient limited by pain    Behavior During Therapy  [facility] for tasks assessed/performed       Past Medical History:  Diagnosis Date  . Chicken pox as a child  . Elevated BP 04/04/2013  . Grief reaction 04/04/2013  . HTN (hypertension) 04/04/2013  . PTSD (post-traumatic stress disorder)   . Unspecified hypothyroidism 04/07/2013    Past Surgical History:  Procedure Laterality Date  . HYDROCELE EXCISION / REPAIR  43 years old  . WISDOM TOOTH EXTRACTION  2010    There were no vitals filed for this visit.  Subjective Assessment - 04/27/18 0850    Subjective  Reports that he hasn't been having a whole lot of pain lately. Does not currently have feeling in R anterior shin currently.     Pertinent History  hypothyroidism, PTSD, HTN, elevated BP    Patient Stated Goals  work on strengthening     Currently in Pain?  No/denies                       [facility] Adult PT Treatment/Exercise - 04/27/18 0001      Lumbar Exercises: Machines for Strengthening   Cybex Knee Extension  B LEs 10x25#   cues to decrease speed   Cybex Knee Flexion  B LEs 15x25#   cues to decrease speed   Leg Press  B LEs 10x45#      Lumbar Exercises: Standing   Functional Squats  15 reps    Functional Squats Limitations  at treadmill; cues to bring bottom back      Manual Therapy    Manual Therapy  Joint mobilization;Passive ROM;Soft tissue mobilization    Manual therapy comments  supine     Joint Mobilization  R 1st MTP flexion, extension, abduction mobilizations grade III to tolerance    Soft tissue mobilization  STM and scar massage to R plantar scar   most tenderness near bottom of incision   Passive ROM  R 1st MTP flexion and extension PROM with prolonged holds to tolerance with gentle distraction for improved tolerance      Ankle Exercises: Aerobic   Nustep  L4 x 1mn LEs only      Ankle Exercises: Seated   Ankle Circles/Pumps Limitations  4 way ankle with red TB x20 each direction      Ankle Exercises: Standing   Heel Raises  15 reps;Both;Limitations   2x15 at treadmill rail   Toe Raise  Limitations    Toe Raise Limitations  R great toe ex stretch + active toe ex 10x3" at treadmill rail   to tolerance   Other Standing Ankle Exercises  B ankle "skiers" in 3 directions x10 cycles   cues to keep heels down  PT Education - 04/27/18 0941    Education Details  adminstered red and green TB to progress 4 way ankle at homr; updated HEP    Person(s) Educated  Patient    Methods  Explanation;Demonstration;Tactile cues;Verbal cues;Handout    Comprehension  Verbalized understanding;Returned demonstration       PT Short Term Goals - 04/24/18 0920      PT SHORT TERM GOAL #1   Title  Patient to be independent with inital HEP.    Time  3    Period  Weeks    Status  Achieved    Target Date  04/23/18        PT Long Term Goals - 04/24/18 0925      PT LONG TERM GOAL #1   Title  Patient to be independent with advanced HEP.    Time  6    Period  Weeks    Status  Partially Met      PT LONG TERM GOAL #2   Title  Patient to report tolerance of 1.5 hours of walking without c/o pain.    Time  6    Period  Weeks    Status  Partially Met      PT LONG TERM GOAL #3   Title  Patient to demonstrate [facility] R 1st MTP and ankle AROM to allow for  efficient gait pattern.    Time  6    Period  Weeks    Status  Partially Met      PT LONG TERM GOAL #4   Title  Patient to demonstrate >=4+/5 R LE strength.    Time  6    Period  Weeks    Status  Achieved      PT LONG TERM GOAL #5   Title  Patient to demonstrate reciprocal step-through pattern with use of 1 handrail as needed to climb up/down 1 flight of stairs to allow safe access to home.    Time  6    Period  Weeks    Status  Achieved            Plan - 04/27/18 0942    Clinical Impression Statement  Patient arrived to session with no new complaints. Tolerated R 1st MTP PROM and joint mobilizations for improved ROM; patient with most discomfort with toe extension. Also with remaining tenderness in bottom portion of R plantar incision with STM. Progressed 4 way ankle with increased banded resistance with good form and tolerance. Tolerated standing ankle and foot strengthening with mild discomfort with toe extension stretch. Advised patient not to push into pain. Tolerated LER machine strengthening with cues to decreased speed and increase control. Patient declined modalities at end of session as he was having minimal pain at end of session. Updated HEP with exercises that were well-tolerated today. Patient reported understanding.      Comorbidities  hypothyroidism, PTSD, HTN, elevated BP    PT Treatment/Interventions  ADLs/Self Care Home Management;Cryotherapy;Electrical Stimulation;Functional mobility training;Stair training;Gait training;Ultrasound;Moist Heat;Therapeutic activities;Therapeutic exercise;Balance training;Neuromuscular re-education;Patient/family education;Orthotic Fit/Training;Passive range of motion;Scar mobilization;Manual techniques;Dry needling;Energy conservation;Splinting;Taping;Vasopneumatic Device    PT Next Visit Plan  progress LE strengthening and great toe ROM    Consulted and Agree with Plan of Care  Patient       Patient will benefit from skilled  therapeutic intervention in order to improve the following deficits and impairments:  Hypomobility, Impaired sensation, Increased edema, Decreased scar mobility, Decreased activity tolerance, Decreased strength, Pain, Increased fascial restricitons, Decreased balance, Difficulty  walking, Improper body mechanics, Decreased range of motion, Postural dysfunction  Visit Diagnosis: Pain in right foot  Stiffness of right foot, not elsewhere classified  Muscle weakness (generalized)  Difficulty in walking, not elsewhere classified     Problem List Patient Active Problem List   Diagnosis Date Noted  . Premature ejaculation 03/21/2018  . Tobacco abuse 05/03/2017  . Depression with anxiety 05/09/2013  . Obesity, unspecified 04/07/2013  . Insomnia 04/07/2013  . Hypothyroidism 04/07/2013  . Essential hypertension 04/04/2013  . Grief reaction 04/04/2013    Janene Harvey, PT, DPT 04/27/18 9:50 AM   [facility] [address]  [place], [place], [address] Phone: [phone]   Fax:  [phone]  Name: Tony Walters MRN: [id number] Date of Birth: 1975/09/18

## 2018-05-01 ENCOUNTER — Ambulatory Visit: Payer: BLUE CROSS/BLUE SHIELD

## 2018-05-01 ENCOUNTER — Other Ambulatory Visit: Payer: Self-pay

## 2018-05-01 DIAGNOSIS — M25674 Stiffness of right foot, not elsewhere classified: Secondary | ICD-10-CM | POA: Diagnosis not present

## 2018-05-01 DIAGNOSIS — R262 Difficulty in walking, not elsewhere classified: Secondary | ICD-10-CM

## 2018-05-01 DIAGNOSIS — M6281 Muscle weakness (generalized): Secondary | ICD-10-CM

## 2018-05-01 DIAGNOSIS — M79671 Pain in right foot: Secondary | ICD-10-CM

## 2018-05-01 NOTE — Therapy (Signed)
[facility] [address]  [place], [place], [address] Phone: [phone]   Fax:  [phone]  Physical Therapy Treatment  Patient Details  Name: Tony Walters MRN: [id number] Date of Birth: 1975/12/19 Referring Provider (PT): Tyson Dense, MD   Encounter Date: 05/01/2018  PT End of Session - 05/01/18 0851    Visit Number  6    Number of Visits  13    Date for PT Re-Evaluation  05/14/18    Authorization Type  BCBS    PT Start Time  0847    PT Stop Time  0940    PT Time Calculation (min)  53 min    Activity Tolerance  Patient tolerated treatment well;Patient limited by pain    Behavior During Therapy  [facility] for tasks assessed/performed       Past Medical History:  Diagnosis Date  . Chicken pox as a child  . Elevated BP 04/04/2013  . Grief reaction 04/04/2013  . HTN (hypertension) 04/04/2013  . PTSD (post-traumatic stress disorder)   . Unspecified hypothyroidism 04/07/2013    Past Surgical History:  Procedure Laterality Date  . HYDROCELE EXCISION / REPAIR  43 years old  . WISDOM TOOTH EXTRACTION  2010    There were no vitals filed for this visit.  Subjective Assessment - 05/01/18 0849    Subjective  Pt. reporting R great toe pain and anterior shin numbness somewhat increased over last few days without known trigger.      Pertinent History  hypothyroidism, PTSD, HTN, elevated BP    Patient Stated Goals  work on strengthening     Currently in Pain?  Yes    Pain Score  4     Pain Location  Toe (Comment which one)    Pain Orientation  Right    Pain Descriptors / Indicators  Throbbing;Sharp    Pain Type  Chronic pain    Pain Radiating Towards  numbness/discomfort into R anterior shin     Multiple Pain Sites  No         [facility] PT Assessment - 05/01/18 0001      Assessment   Next MD Visit  05/15/18                   [facility] Adult PT Treatment/Exercise - 05/01/18 0913      Lumbar Exercises:  Aerobic   Recumbent Bike  Lvl 2, 6 min       Modalities   Modalities  Electrical Stimulation      Electrical Stimulation   Electrical Stimulation Location  Dorsum of R foot    Electrical Stimulation Action  IFC    Electrical Stimulation Parameters  to tolerance, 10'    Electrical Stimulation Goals  Pain      Manual Therapy   Manual Therapy  Joint mobilization;Passive ROM;Soft tissue mobilization    Manual therapy comments  supine     Joint Mobilization  R 1st MTP flexion, extension, abduction mobilizations grade III to tolerance    Soft tissue mobilization  STM and scar massage to R plantar scar    Passive ROM  R 1st MTP flexion and extension PROM with prolonged holds to tolerance with gentle distraction       Ankle Exercises: Stretches   Gastroc Stretch  30 seconds;2 reps    Gastroc Stretch Limitations  + HS with strap       Ankle Exercises: Standing   Heel Raises  20  reps    Toe Raise  20 reps;3 seconds      Ankle Exercises: Aerobic   Recumbent Bike  Lvl 2, 6 min                PT Short Term Goals - 04/24/18 0920      PT SHORT TERM GOAL #1   Title  Patient to be independent with inital HEP.    Time  3    Period  Weeks    Status  Achieved    Target Date  04/23/18        PT Long Term Goals - 04/24/18 0925      PT LONG TERM GOAL #1   Title  Patient to be independent with advanced HEP.    Time  6    Period  Weeks    Status  Partially Met      PT LONG TERM GOAL #2   Title  Patient to report tolerance of 1.5 hours of walking without c/o pain.    Time  6    Period  Weeks    Status  Partially Met      PT LONG TERM GOAL #3   Title  Patient to demonstrate [facility] R 1st MTP and ankle AROM to allow for efficient gait pattern.    Time  6    Period  Weeks    Status  Partially Met      PT LONG TERM GOAL #4   Title  Patient to demonstrate >=4+/5 R LE strength.    Time  6    Period  Weeks    Status  Achieved      PT LONG TERM GOAL #5   Title  Patient to  demonstrate reciprocal step-through pattern with use of 1 handrail as needed to climb up/down 1 flight of stairs to allow safe access to home.    Time  6    Period  Weeks    Status  Achieved            Plan - 05/01/18 7619    Clinical Impression Statement  Pt. reporting increase in R great toe pain and mildly increased numbness in R anterior shin today without known trigger.  Pt. encouraged to contact MD if this continues to worsen.  Session focused on MT for improved R great toe ROM for improved gait mechanics and functional mobility.  Also able to progress heel/toe raises in standing with pt. complaining of increased R great toe pain following.  Ended visit with E-stim application to R great toe/dorsum of foot with good overall pain relief noted following.      Personal Factors and Comorbidities  Past/Current Experience;Comorbidity 3+    Comorbidities  hypothyroidism, PTSD, HTN, elevated BP    Examination-Activity Limitations  Bathing;Sit;Sleep;Carry;Squat;Stairs;Stand;Lift;Transfers;Locomotion Level    Examination-Participation Restrictions  Church;Cleaning;Community Activity;Shop;Interpersonal Relationship;Yard Work;Laundry;Meal Prep    Stability/Clinical Decision Making  Evolving/Moderate complexity    Rehab Potential  Good    PT Treatment/Interventions  ADLs/Self Care Home Management;Cryotherapy;Electrical Stimulation;Functional mobility training;Stair training;Gait training;Ultrasound;Moist Heat;Therapeutic activities;Therapeutic exercise;Balance training;Neuromuscular re-education;Patient/family education;Orthotic Fit/Training;Passive range of motion;Scar mobilization;Manual techniques;Dry needling;Energy conservation;Splinting;Taping;Vasopneumatic Device    PT Next Visit Plan  progress LE strengthening and great toe ROM    Consulted and Agree with Plan of Care  Patient       Patient will benefit from skilled therapeutic intervention in order to improve the following deficits and  impairments:  Hypomobility, Impaired sensation, Increased edema, Decreased scar mobility, Decreased activity tolerance,  Decreased strength, Pain, Increased fascial restricitons, Decreased balance, Difficulty walking, Improper body mechanics, Decreased range of motion, Postural dysfunction  Visit Diagnosis: Pain in right foot  Stiffness of right foot, not elsewhere classified  Muscle weakness (generalized)  Difficulty in walking, not elsewhere classified     Problem List Patient Active Problem List   Diagnosis Date Noted  . Premature ejaculation 03/21/2018  . Tobacco abuse 05/03/2017  . Depression with anxiety 05/09/2013  . Obesity, unspecified 04/07/2013  . Insomnia 04/07/2013  . Hypothyroidism 04/07/2013  . Essential hypertension 04/04/2013  . Grief reaction 04/04/2013    Bess Harvest, PTA 05/01/18 12:59 PM   [facility] [address]  [place], [place], [address] Phone: [phone]   Fax:  [phone]  Name: Rody Keadle MRN: [id number] Date of Birth: 1975/03/04

## 2018-05-03 ENCOUNTER — Encounter: Payer: Self-pay | Admitting: Physical Therapy

## 2018-05-03 ENCOUNTER — Other Ambulatory Visit: Payer: Self-pay

## 2018-05-03 ENCOUNTER — Ambulatory Visit: Payer: BLUE CROSS/BLUE SHIELD | Admitting: Physical Therapy

## 2018-05-03 DIAGNOSIS — M6281 Muscle weakness (generalized): Secondary | ICD-10-CM | POA: Diagnosis not present

## 2018-05-03 DIAGNOSIS — M25674 Stiffness of right foot, not elsewhere classified: Secondary | ICD-10-CM

## 2018-05-03 DIAGNOSIS — M79671 Pain in right foot: Secondary | ICD-10-CM

## 2018-05-03 DIAGNOSIS — R262 Difficulty in walking, not elsewhere classified: Secondary | ICD-10-CM | POA: Diagnosis not present

## 2018-05-03 NOTE — Therapy (Signed)
[facility] [address]  [place], [place], [address] Phone: [phone]   Fax:  [phone]  Physical Therapy Treatment  Patient Details  Name: Tony Walters MRN: [id number] Date of Birth: 05/17/1975 Referring Provider (PT): Tyson Dense, MD   Encounter Date: 05/03/2018  PT End of Session - 05/03/18 0927    Visit Number  7    Number of Visits  13    Date for PT Re-Evaluation  05/14/18    Authorization Type  BCBS    PT Start Time  0846    PT Stop Time  0941    PT Time Calculation (min)  55 min    Activity Tolerance  Patient tolerated treatment well;Patient limited by pain    Behavior During Therapy  [facility] for tasks assessed/performed       Past Medical History:  Diagnosis Date  . Chicken pox as a child  . Elevated BP 04/04/2013  . Grief reaction 04/04/2013  . HTN (hypertension) 04/04/2013  . PTSD (post-traumatic stress disorder)   . Unspecified hypothyroidism 04/07/2013    Past Surgical History:  Procedure Laterality Date  . HYDROCELE EXCISION / REPAIR  43 years old  . WISDOM TOOTH EXTRACTION  2010    There were no vitals filed for this visit.  Subjective Assessment - 05/03/18 0848    Subjective  Reports that he has not been to sleep today- just drove from [place] because his uncle passed away. Has been having more pain in R toe recently. Having numbness in R shin and glute currently.     Pertinent History  hypothyroidism, PTSD, HTN, elevated BP    Patient Stated Goals  work on strengthening     Currently in Pain?  Yes    Pain Score  4     Pain Location  Toe (Comment which one)    Pain Orientation  Right    Pain Descriptors / Indicators  Throbbing;Sharp    Pain Type  Chronic pain                       [facility] Adult PT Treatment/Exercise - 05/03/18 0001      Self-Care   Self-Care  Other Self-Care Comments    Other Self-Care Comments   Edu and practice of ball massage to R buttock for  relief of pain and N/T      Lumbar Exercises: Stretches   Piriformis Stretch  Right;2 reps;30 seconds    Piriformis Stretch Limitations  sititng KTOS    Figure 4 Stretch  2 reps;With overpressure;Seated    Figure 4 Stretch Limitations  R LE      Lumbar Exercises: Aerobic   Recumbent Bike  Lvl 2, 6 min       Lumbar Exercises: Seated   Other Seated Lumbar Exercises  R LE sitting sciatic glide x10   reporting mildly improved symptoms     Vasopneumatic   Number Minutes Vasopneumatic   15 minutes    Vasopnuematic Location   Ankle   R   Vasopneumatic Pressure  Low    Vasopneumatic Temperature   Coldest temp.        Ankle Exercises: Stretches   Soleus Stretch  30 seconds;2 reps   at wall   Gastroc Stretch  30 seconds;2 reps    Gastroc Stretch Limitations  at wall      Ankle Exercises: Seated   Ankle Circles/Pumps Limitations  4 way ankle with green  TB x20 each direction      Ankle Exercises: Standing   SLS  R 3 x 30" at chair    Heel Raises  Both;20 reps   unable to tolerance B conc/R ecc            PT Education - 05/03/18 0927    Education Details  updated to HEP; administered green TB for 4 way ankle    Person(s) Educated  Patient    Methods  Explanation;Demonstration;Tactile cues;Verbal cues;Handout    Comprehension  Verbalized understanding;Returned demonstration       PT Short Term Goals - 04/24/18 0920      PT SHORT TERM GOAL #1   Title  Patient to be independent with inital HEP.    Time  3    Period  Weeks    Status  Achieved    Target Date  04/23/18        PT Long Term Goals - 04/24/18 0925      PT LONG TERM GOAL #1   Title  Patient to be independent with advanced HEP.    Time  6    Period  Weeks    Status  Partially Met      PT LONG TERM GOAL #2   Title  Patient to report tolerance of 1.5 hours of walking without c/o pain.    Time  6    Period  Weeks    Status  Partially Met      PT LONG TERM GOAL #3   Title  Patient to demonstrate [facility]  R 1st MTP and ankle AROM to allow for efficient gait pattern.    Time  6    Period  Weeks    Status  Partially Met      PT LONG TERM GOAL #4   Title  Patient to demonstrate >=4+/5 R LE strength.    Time  6    Period  Weeks    Status  Achieved      PT LONG TERM GOAL #5   Title  Patient to demonstrate reciprocal step-through pattern with use of 1 handrail as needed to climb up/down 1 flight of stairs to allow safe access to home.    Time  6    Period  Weeks    Status  Achieved            Plan - 05/03/18 7096    Clinical Impression Statement  Patient reporting increased R 1st MTP pain without known cause today. Also reporting R anterior shin and buttock numbness at beginning of session, worse with sitting. Patient reported mild improvement in R buttock pain and N/T with sitting sciatic nerve glides. Reported near resolution of symptoms with ball on wall massage and sitting piriformis stretching. Updated HEP with these exercises for continued pain relief- patient reported understanding. Progressed 4 way ankle with increased banded resistance with good tolerance. Attempted to perform B concentric/R eccentric heel raises, however patient limited by pain. Better tolerance for B heel raises. Ended session with gameready to R ankle per patient's request. Patient with report of improvement in pain levels at conclusion of sesison.    Comorbidities  hypothyroidism, PTSD, HTN, elevated BP    PT Treatment/Interventions  ADLs/Self Care Home Management;Cryotherapy;Electrical Stimulation;Functional mobility training;Stair training;Gait training;Ultrasound;Moist Heat;Therapeutic activities;Therapeutic exercise;Balance training;Neuromuscular re-education;Patient/family education;Orthotic Fit/Training;Passive range of motion;Scar mobilization;Manual techniques;Dry needling;Energy conservation;Splinting;Taping;Vasopneumatic Device    PT Next Visit Plan  progress LE strengthening and great toe ROM     Consulted and  Agree with Plan of Care  Patient       Patient will benefit from skilled therapeutic intervention in order to improve the following deficits and impairments:  Hypomobility, Impaired sensation, Increased edema, Decreased scar mobility, Decreased activity tolerance, Decreased strength, Pain, Increased fascial restricitons, Decreased balance, Difficulty walking, Improper body mechanics, Decreased range of motion, Postural dysfunction  Visit Diagnosis: Pain in right foot  Stiffness of right foot, not elsewhere classified  Muscle weakness (generalized)  Difficulty in walking, not elsewhere classified     Problem List Patient Active Problem List   Diagnosis Date Noted  . Premature ejaculation 03/21/2018  . Tobacco abuse 05/03/2017  . Depression with anxiety 05/09/2013  . Obesity, unspecified 04/07/2013  . Insomnia 04/07/2013  . Hypothyroidism 04/07/2013  . Essential hypertension 04/04/2013  . Grief reaction 04/04/2013     Janene Harvey, PT, DPT 05/03/18 9:46 AM   [facility] [address]  [place], [place], [address] Phone: [phone]   Fax:  [phone]  Name: Tivis Wherry MRN: [id number] Date of Birth: 01-02-1976

## 2018-05-08 ENCOUNTER — Other Ambulatory Visit: Payer: Self-pay | Admitting: Family Medicine

## 2018-05-08 ENCOUNTER — Other Ambulatory Visit: Payer: Self-pay

## 2018-05-08 ENCOUNTER — Ambulatory Visit: Payer: BLUE CROSS/BLUE SHIELD

## 2018-05-08 DIAGNOSIS — R262 Difficulty in walking, not elsewhere classified: Secondary | ICD-10-CM | POA: Diagnosis not present

## 2018-05-08 DIAGNOSIS — M6281 Muscle weakness (generalized): Secondary | ICD-10-CM | POA: Diagnosis not present

## 2018-05-08 DIAGNOSIS — M79671 Pain in right foot: Secondary | ICD-10-CM | POA: Diagnosis not present

## 2018-05-08 DIAGNOSIS — M25674 Stiffness of right foot, not elsewhere classified: Secondary | ICD-10-CM

## 2018-05-08 MED FILL — LEVOTHYROXINE 50 MCG TABLET: 50 | 30 days supply | Qty: 30 | Fill #0

## 2018-05-08 NOTE — Therapy (Signed)
[facility] [address]  [place], [place], [address] Phone: [phone]   Fax:  [phone]  Physical Therapy Treatment  Patient Details  Name: Tony Walters MRN: [id number] Date of Birth: 28-Oct-1975 Referring Provider (PT): Tyson Dense, MD   Encounter Date: 05/08/2018  PT End of Session - 05/08/18 0845    Visit Number  8    Number of Visits  13    Date for PT Re-Evaluation  05/14/18    Authorization Type  BCBS    PT Start Time  0840    PT Stop Time  0940    PT Time Calculation (min)  60 min    Activity Tolerance  Patient tolerated treatment well;Patient limited by pain    Behavior During Therapy  [facility] for tasks assessed/performed       Past Medical History:  Diagnosis Date  . Chicken pox as a child  . Elevated BP 04/04/2013  . Grief reaction 04/04/2013  . HTN (hypertension) 04/04/2013  . PTSD (post-traumatic stress disorder)   . Unspecified hypothyroidism 04/07/2013    Past Surgical History:  Procedure Laterality Date  . HYDROCELE EXCISION / REPAIR  43 years old  . WISDOM TOOTH EXTRACTION  2010    There were no vitals filed for this visit.  Subjective Assessment - 05/08/18 0842    Subjective  Pt. noting good relief from recent HEP update.      Pertinent History  hypothyroidism, PTSD, HTN, elevated BP    Patient Stated Goals  work on strengthening     Currently in Pain?  Yes    Pain Score  4    R toe pain rising to 10/10 briefly with sharp pain while sitting in recliner yesterday.     Pain Location  Toe (Comment which one)    Pain Orientation  Right    Pain Descriptors / Indicators  Throbbing;Sharp    Pain Type  Chronic pain    Pain Radiating Towards  numbness/discomfort into R anterior shin     Pain Onset  More than a month ago    Pain Frequency  Intermittent    Aggravating Factors   standing, walking distance     Multiple Pain Sites  No                       [facility] Adult PT  Treatment/Exercise - 05/08/18 0919      Lumbar Exercises: Aerobic   Recumbent Bike  Lvl 3, 7 min       Vasopneumatic   Number Minutes Vasopneumatic   10 minutes    Vasopnuematic Location   Ankle   R   Vasopneumatic Pressure  Medium    Vasopneumatic Temperature   Coldest temp.        Manual Therapy   Manual Therapy  Joint mobilization;Passive ROM;Soft tissue mobilization    Manual therapy comments  supine     Joint Mobilization  R 1st MTP flexion, extension mobilizations grade III to tolerance    Soft tissue mobilization  STM and scar massage to R plantar scar; pt. remains tender    Passive ROM  R 1st MTP flexion and extension PROM with prolonged holds to tolerance with gentle distraction       Ankle Exercises: Machines for Strengthening   Cybex Leg Press  B straight leg calf raise: 35# 2 x 20 reps      Ankle Exercises: Standing   Vector Stance  Right;2 reps   x 20 sec    Vector Stance Limitations  L LE toe-touch to cones     SLS  R 2 x 20" at wall; eyes closed     Heel Raises  Both;20 reps    Toe Walk (Round Trip)  x 4 x 15 ft     Side Shuffle (Round Trip)  Side stepping with TB at forefoot 4 x 15 ft                PT Short Term Goals - 04/24/18 0920      PT SHORT TERM GOAL #1   Title  Patient to be independent with inital HEP.    Time  3    Period  Weeks    Status  Achieved    Target Date  04/23/18        PT Long Term Goals - 04/24/18 0925      PT LONG TERM GOAL #1   Title  Patient to be independent with advanced HEP.    Time  6    Period  Weeks    Status  Partially Met      PT LONG TERM GOAL #2   Title  Patient to report tolerance of 1.5 hours of walking without c/o pain.    Time  6    Period  Weeks    Status  Partially Met      PT LONG TERM GOAL #3   Title  Patient to demonstrate [facility] R 1st MTP and ankle AROM to allow for efficient gait pattern.    Time  6    Period  Weeks    Status  Partially Met      PT LONG TERM GOAL #4   Title  Patient  to demonstrate >=4+/5 R LE strength.    Time  6    Period  Weeks    Status  Achieved      PT LONG TERM GOAL #5   Title  Patient to demonstrate reciprocal step-through pattern with use of 1 handrail as needed to climb up/down 1 flight of stairs to allow safe access to home.    Time  6    Period  Weeks    Status  Achieved            Plan - 05/08/18 0845    Clinical Impression Statement  Pt. noting he felt intense, quick, sharp pain in R great toe while sitting in recliner yesterday which subsided quickly without known trigger.  Does feel he has had good relief from recent HEP update with R hip/LE pain and performing these exercises daily.  Tolerated addition of 35# straight-leg calf raises on leg press machine well today.  MT addressing R great toe mobility for improved ROM and scar mobility.  Pt. tolerated all activities in session well today with most difficulty with heel walk at counter.  Ended visit with pt. noting pain returning to baseline and applied ice/compression to R foot/ankle for reduction in post-exercise swelling and soreness.  Will continue to progress toward goals.      Personal Factors and Comorbidities  Past/Current Experience;Comorbidity 3+    Comorbidities  hypothyroidism, PTSD, HTN, elevated BP    Examination-Activity Limitations  Bathing;Sit;Sleep;Carry;Squat;Stairs;Stand;Lift;Transfers;Locomotion Level    Examination-Participation Restrictions  Church;Cleaning;Community Activity;Shop;Interpersonal Relationship;Yard Work;Laundry;Meal Prep    Stability/Clinical Decision Making  Evolving/Moderate complexity    Rehab Potential  Good    PT Treatment/Interventions  ADLs/Self Care Home Management;Cryotherapy;Electrical Stimulation;Functional mobility training;Stair training;Gait  training;Ultrasound;Moist Heat;Therapeutic activities;Therapeutic exercise;Balance training;Neuromuscular re-education;Patient/family education;Orthotic Fit/Training;Passive range of motion;Scar  mobilization;Manual techniques;Dry needling;Energy conservation;Splinting;Taping;Vasopneumatic Device    PT Next Visit Plan  progress LE strengthening and great toe ROM    Consulted and Agree with Plan of Care  Patient       Patient will benefit from skilled therapeutic intervention in order to improve the following deficits and impairments:  Hypomobility, Impaired sensation, Increased edema, Decreased scar mobility, Decreased activity tolerance, Decreased strength, Pain, Increased fascial restricitons, Decreased balance, Difficulty walking, Improper body mechanics, Decreased range of motion, Postural dysfunction  Visit Diagnosis: Pain in right foot  Stiffness of right foot, not elsewhere classified  Muscle weakness (generalized)  Difficulty in walking, not elsewhere classified     Problem List Patient Active Problem List   Diagnosis Date Noted  . Premature ejaculation 03/21/2018  . Tobacco abuse 05/03/2017  . Depression with anxiety 05/09/2013  . Obesity, unspecified 04/07/2013  . Insomnia 04/07/2013  . Hypothyroidism 04/07/2013  . Essential hypertension 04/04/2013  . Grief reaction 04/04/2013    Bess Harvest, PTA 05/08/18 10:06 AM   [facility] [address]  [place], [place], [address] Phone: [phone]   Fax:  [phone]  Name: Harlee Pursifull MRN: [id number] Date of Birth: Jun 17, 1975

## 2018-05-11 ENCOUNTER — Other Ambulatory Visit: Payer: Self-pay

## 2018-05-11 ENCOUNTER — Ambulatory Visit: Payer: BLUE CROSS/BLUE SHIELD

## 2018-05-11 DIAGNOSIS — M25674 Stiffness of right foot, not elsewhere classified: Secondary | ICD-10-CM

## 2018-05-11 DIAGNOSIS — M79671 Pain in right foot: Secondary | ICD-10-CM

## 2018-05-11 DIAGNOSIS — M6281 Muscle weakness (generalized): Secondary | ICD-10-CM

## 2018-05-11 DIAGNOSIS — R262 Difficulty in walking, not elsewhere classified: Secondary | ICD-10-CM | POA: Diagnosis not present

## 2018-05-11 NOTE — Therapy (Signed)
[facility] [address]  [place], [place], [address] Phone: [phone]   Fax:  [phone]  Physical Therapy Treatment  Patient Details  Name: Tony Walters MRN: [id number] Date of Birth: 09-13-1975 Referring Provider (PT): Tyson Dense, MD   Encounter Date: 05/11/2018  PT End of Session - 05/11/18 0848    Visit Number  9    Number of Visits  13    Date for PT Re-Evaluation  05/14/18    Authorization Type  BCBS    PT Start Time  0845    PT Stop Time  0940    PT Time Calculation (min)  55 min    Activity Tolerance  Patient tolerated treatment well;Patient limited by pain    Behavior During Therapy  [facility] for tasks assessed/performed       Past Medical History:  Diagnosis Date  . Chicken pox as a child  . Elevated BP 04/04/2013  . Grief reaction 04/04/2013  . HTN (hypertension) 04/04/2013  . PTSD (post-traumatic stress disorder)   . Unspecified hypothyroidism 04/07/2013    Past Surgical History:  Procedure Laterality Date  . HYDROCELE EXCISION / REPAIR  42 years old  . WISDOM TOOTH EXTRACTION  2010    There were no vitals filed for this visit.  Subjective Assessment - 05/11/18 0846    Subjective  Pt. reporting he is still feeling "electric" pain in R great toe occasionally throughout day without known trigger.      Pertinent History  hypothyroidism, PTSD, HTN, elevated BP    Patient Stated Goals  work on strengthening     Currently in Pain?  Yes    Pain Score  3    Pt. reporting pain rising to 9/10 at worst   Pain Location  Toe (Comment which one)    Pain Orientation  Right    Pain Descriptors / Indicators  Sharp   "Electric"   Pain Type  Chronic pain    Pain Radiating Towards  numbness/discomfort into R anterior shin     Pain Onset  More than a month ago    Pain Frequency  Intermittent    Aggravating Factors   unsure     Multiple Pain Sites  No                       [facility]  Adult PT Treatment/Exercise - 05/11/18 0001      Lumbar Exercises: Aerobic   Recumbent Bike  Lvl 3, 7 min       Manual Therapy   Manual Therapy  Joint mobilization;Passive ROM;Soft tissue mobilization    Manual therapy comments  supine     Joint Mobilization  R 1st MTP flexion, extension mobilizations grade III to tolerance    Soft tissue mobilization  STM/strumming  and scar massage to R plantar scar; pt. remains tender    Passive ROM  R 1st MTP flexion and extension PROM with prolonged holds to tolerance with gentle distraction       Ankle Exercises: Stretches   Plantar Fascia Stretch  2 reps;30 seconds   into base of wall + great toe extension    Soleus Stretch  30 seconds;2 reps   into wall    Gastroc Stretch  30 seconds;2 reps    Gastroc Stretch Limitations  at wall      Ankle Exercises: Supine   T-Band  R great toe + DF x 15 resp with yellow  TB               PT Short Term Goals - 04/24/18 0920      PT SHORT TERM GOAL #1   Title  Patient to be independent with inital HEP.    Time  3    Period  Weeks    Status  Achieved    Target Date  04/23/18        PT Long Term Goals - 04/24/18 0925      PT LONG TERM GOAL #1   Title  Patient to be independent with advanced HEP.    Time  6    Period  Weeks    Status  Partially Met      PT LONG TERM GOAL #2   Title  Patient to report tolerance of 1.5 hours of walking without c/o pain.    Time  6    Period  Weeks    Status  Partially Met      PT LONG TERM GOAL #3   Title  Patient to demonstrate [facility] R 1st MTP and ankle AROM to allow for efficient gait pattern.    Time  6    Period  Weeks    Status  Partially Met      PT LONG TERM GOAL #4   Title  Patient to demonstrate >=4+/5 R LE strength.    Time  6    Period  Weeks    Status  Achieved      PT LONG TERM GOAL #5   Title  Patient to demonstrate reciprocal step-through pattern with use of 1 handrail as needed to climb up/down 1 flight of stairs to allow safe  access to home.    Time  6    Period  Weeks    Status  Achieved            Plan - 05/11/18 7829    Clinical Impression Statement  Pt. reporting his R great toe pain is somewhat less frequent however this is still his primary complaint.  This R great toe pain does not have a trigger and lasts a short duration.  Pt. with complaint of R great toe pain x 2 in session with standing exercise which quickly subsided.  Tolerated MT focused on improving great toe ROM well today.  Ended visit with pt. deferring modalities.  Will continue to progress toward goals.      Personal Factors and Comorbidities  Past/Current Experience;Comorbidity 3+    Comorbidities  hypothyroidism, PTSD, HTN, elevated BP    Examination-Activity Limitations  Bathing;Sit;Sleep;Carry;Squat;Stairs;Stand;Lift;Transfers;Locomotion Level    Examination-Participation Restrictions  Church;Cleaning;Community Activity;Shop;Interpersonal Relationship;Yard Work;Laundry;Meal Prep    Stability/Clinical Decision Making  Evolving/Moderate complexity    Rehab Potential  Good    PT Treatment/Interventions  ADLs/Self Care Home Management;Cryotherapy;Electrical Stimulation;Functional mobility training;Stair training;Gait training;Ultrasound;Moist Heat;Therapeutic activities;Therapeutic exercise;Balance training;Neuromuscular re-education;Patient/family education;Orthotic Fit/Training;Passive range of motion;Scar mobilization;Manual techniques;Dry needling;Energy conservation;Splinting;Taping;Vasopneumatic Device    PT Next Visit Plan  progress LE strengthening and great toe ROM    Consulted and Agree with Plan of Care  Patient       Patient will benefit from skilled therapeutic intervention in order to improve the following deficits and impairments:  Hypomobility, Impaired sensation, Increased edema, Decreased scar mobility, Decreased activity tolerance, Decreased strength, Pain, Increased fascial restricitons, Decreased balance, Difficulty  walking, Improper body mechanics, Decreased range of motion, Postural dysfunction  Visit Diagnosis: Pain in right foot  Stiffness of right foot, not elsewhere classified  Muscle  weakness (generalized)  Difficulty in walking, not elsewhere classified     Problem List Patient Active Problem List   Diagnosis Date Noted  . Premature ejaculation 03/21/2018  . Tobacco abuse 05/03/2017  . Depression with anxiety 05/09/2013  . Obesity, unspecified 04/07/2013  . Insomnia 04/07/2013  . Hypothyroidism 04/07/2013  . Essential hypertension 04/04/2013  . Grief reaction 04/04/2013    Bess Harvest, PTA 05/11/18 12:34 PM   [facility] [address]  [place], [place], [address] Phone: [phone]   Fax:  [phone]  Name: Tony Walters MRN: [id number] Date of Birth: [birth year]

## 2018-05-14 ENCOUNTER — Ambulatory Visit: Payer: BLUE CROSS/BLUE SHIELD | Admitting: Family Medicine

## 2018-05-15 ENCOUNTER — Ambulatory Visit: Payer: BLUE CROSS/BLUE SHIELD

## 2018-05-15 ENCOUNTER — Other Ambulatory Visit: Payer: Self-pay

## 2018-05-15 ENCOUNTER — Ambulatory Visit (INDEPENDENT_AMBULATORY_CARE_PROVIDER_SITE_OTHER): Payer: BLUE CROSS/BLUE SHIELD | Admitting: Podiatry

## 2018-05-15 ENCOUNTER — Telehealth: Payer: Self-pay | Admitting: *Deleted

## 2018-05-15 ENCOUNTER — Encounter: Payer: Self-pay | Admitting: Podiatry

## 2018-05-15 VITALS — Temp 97.7°F

## 2018-05-15 DIAGNOSIS — M25674 Stiffness of right foot, not elsewhere classified: Secondary | ICD-10-CM

## 2018-05-15 DIAGNOSIS — M205X1 Other deformities of toe(s) (acquired), right foot: Secondary | ICD-10-CM

## 2018-05-15 DIAGNOSIS — M79671 Pain in right foot: Secondary | ICD-10-CM

## 2018-05-15 DIAGNOSIS — Z9889 Other specified postprocedural states: Secondary | ICD-10-CM

## 2018-05-15 DIAGNOSIS — M6281 Muscle weakness (generalized): Secondary | ICD-10-CM

## 2018-05-15 DIAGNOSIS — R262 Difficulty in walking, not elsewhere classified: Secondary | ICD-10-CM

## 2018-05-15 DIAGNOSIS — M722 Plantar fascial fibromatosis: Secondary | ICD-10-CM | POA: Diagnosis not present

## 2018-05-15 MED ORDER — NONFORMULARY OR COMPOUNDED ITEM: 3 refills | Status: DC

## 2018-05-15 NOTE — Telephone Encounter (Signed)
-----   Message from Rip Harbour, [facility] sent at 05/15/2018 11:54 AM EDT ----- Regarding: Refill compound Please refill scar cream #14 from [facility] refills

## 2018-05-15 NOTE — Therapy (Signed)
[facility] [address]  [place], [place], [address] Phone: [phone]   Fax:  [phone]  Physical Therapy Treatment  Patient Details  Name: Tony Walters MRN: [id number] Date of Birth: December 10, 1975 Referring Provider (PT): Tyson Dense, MD   Encounter Date: 05/15/2018  PT End of Session - 05/15/18 0854    Visit Number  10    Number of Visits  13    Date for PT Re-Evaluation  05/14/18    Authorization Type  BCBS    PT Start Time  0846    PT Stop Time  0933    PT Time Calculation (min)  47 min    Activity Tolerance  Patient tolerated treatment well;Patient limited by pain    Behavior During Therapy  [facility] for tasks assessed/performed       Past Medical History:  Diagnosis Date  . Chicken pox as a child  . Elevated BP 04/04/2013  . Grief reaction 04/04/2013  . HTN (hypertension) 04/04/2013  . PTSD (post-traumatic stress disorder)   . Unspecified hypothyroidism 04/07/2013    Past Surgical History:  Procedure Laterality Date  . HYDROCELE EXCISION / REPAIR  43 years old  . WISDOM TOOTH EXTRACTION  2010    There were no vitals filed for this visit.  Subjective Assessment - 05/15/18 0853    Subjective  Pt. reporting he felt better after last session.      Pertinent History  hypothyroidism, PTSD, HTN, elevated BP    Patient Stated Goals  work on strengthening     Currently in Pain?  Yes    Pain Score  2     Pain Location  Toe (Comment which one)   R great toe   Pain Orientation  Right    Pain Descriptors / Indicators  Sharp    Pain Type  Chronic pain    Pain Radiating Towards  numbness/discomfort into R anterior shin     Pain Onset  More than a month ago    Pain Frequency  Intermittent    Multiple Pain Sites  No         [facility] PT Assessment - 05/15/18 0001      Observation/Other Assessments   Focus on Therapeutic Outcomes (FOTO)   57% (43% limitation)       ROM / Strength   AROM / PROM /  Strength  PROM      AROM   AROM Assessment Site  Other (comment)   R great toe ext AROM 25dg, PROM 45 dg   Right/Left Ankle  Right    Right Ankle Dorsiflexion  12      PROM   Right/Left Ankle  Right    Right Ankle Dorsiflexion  18   seated with therapist overpressure                   [facility] Adult PT Treatment/Exercise - 05/15/18 0001      Ambulation/Gait   Stairs  Yes    Stairs Assistance  6: Modified independent (Device/Increase time)    Stair Management Technique  No rails;Alternating pattern    Number of Stairs  14    Height of Stairs  7    Gait Comments  Pt. able to ascend/descend stairs reciprocally with good eccentric control and only slight evidence of limited R great toe extension ROM; pt. able to perform safely      Self-Care   Self-Care  Other Self-Care Comments  Other Self-Care Comments   Discussed need for ongoing HEP adherence focusing on R calf stretching, great toe extension stretching, R SLS "clocks", and heel raises; pt. verbalized understanding       Lumbar Exercises: Aerobic   Recumbent Bike  Lvl 3, 7 min       Ankle Exercises: Stretches   Plantar Fascia Stretch  2 reps;30 seconds   at wall: DF + great toe extension stretch    Soleus Stretch  30 seconds;2 reps   at wall    Gastroc Stretch  30 seconds;2 reps    Gastroc Stretch Limitations  at wall      Ankle Exercises: Standing   Vector Stance  Right;3 reps    Vector Stance Limitations  R SLS + L LE "clocks" 3 x 20 sec; 1st set on foam (pt. unable to perform movement with good control); last two sets of 20 sec on hard surface    Heel Raises  Both;20 reps    Heel Raises Limitations  Cues provided for full ROM       Ankle Exercises: Machines for Strengthening   Cybex Leg Press  B straight leg calf raise: 35# 2 x 20 reps   Focusing on eccentic control and full ROM               PT Short Term Goals - 04/24/18 0920      PT SHORT TERM GOAL #1   Title  Patient to be independent  with inital HEP.    Time  3    Period  Weeks    Status  Achieved    Target Date  04/23/18        PT Long Term Goals - 05/15/18 0907      PT LONG TERM GOAL #1   Title  Patient to be independent with advanced HEP.    Time  6    Period  Weeks    Status  Partially Met      PT LONG TERM GOAL #2   Title  Patient to report tolerance of 1.5 hours of walking without c/o pain.    Time  6    Period  Weeks    Status  Achieved   05/15/18: "yeah I can do an hour and a half".     PT LONG TERM GOAL #3   Title  Patient to demonstrate [facility] R 1st MTP and ankle AROM to allow for efficient gait pattern.    Time  6    Period  Weeks    Status  Partially Met      PT LONG TERM GOAL #4   Title  Patient to demonstrate >=4+/5 R LE strength.    Time  6    Period  Weeks    Status  Achieved      PT LONG TERM GOAL #5   Title  Patient to demonstrate reciprocal step-through pattern with use of 1 handrail as needed to climb up/down 1 flight of stairs to allow safe access to home.    Time  6    Period  Weeks    Status  Achieved            Plan - 05/15/18 0920    Clinical Impression Statement  Shamus has made good progress with therapy.  Able to achieve LTG #2 today reporting he is able to ambulate at least an hour and a half without pain on a typical day.  Does still demonstrate limited R ankle  DF AROM/PROM and R great toe extension ROM which is evident at times with stair navigation and gait pattern.  Session focused on MT with R great toe A/P MTP mobilizations for improved ROM and incisional massage along plantar surface of R foot for improved tissue quality and comfort with wt. bearing activities.  Pt. noting improved mobility following MT performed last session and today.  Ended session discussing current HEP adherence and encouraged pt. to continue daily calf/great toe extension stretching along with heel raise and R SLS "clocks".  Pt. verbalized understanding.  Pt. to see MD later today for f/u.   Will continue to progress toward goals.        Personal Factors and Comorbidities  Past/Current Experience;Comorbidity 3+    Comorbidities  hypothyroidism, PTSD, HTN, elevated BP    Examination-Activity Limitations  Bathing;Sit;Sleep;Carry;Squat;Stairs;Stand;Lift;Transfers;Locomotion Level    Examination-Participation Restrictions  Church;Cleaning;Community Activity;Shop;Interpersonal Relationship;Yard Work;Laundry;Meal Prep    Stability/Clinical Decision Making  Evolving/Moderate complexity    Rehab Potential  Good    PT Treatment/Interventions  ADLs/Self Care Home Management;Cryotherapy;Electrical Stimulation;Functional mobility training;Stair training;Gait training;Ultrasound;Moist Heat;Therapeutic activities;Therapeutic exercise;Balance training;Neuromuscular re-education;Patient/family education;Orthotic Fit/Training;Passive range of motion;Scar mobilization;Manual techniques;Dry needling;Energy conservation;Splinting;Taping;Vasopneumatic Device    PT Next Visit Plan  progress LE strengthening and great toe ROM    Consulted and Agree with Plan of Care  Patient       Patient will benefit from skilled therapeutic intervention in order to improve the following deficits and impairments:  Hypomobility, Impaired sensation, Increased edema, Decreased scar mobility, Decreased activity tolerance, Decreased strength, Pain, Increased fascial restricitons, Decreased balance, Difficulty walking, Improper body mechanics, Decreased range of motion, Postural dysfunction  Visit Diagnosis: Pain in right foot  Stiffness of right foot, not elsewhere classified  Muscle weakness (generalized)  Difficulty in walking, not elsewhere classified     Problem List Patient Active Problem List   Diagnosis Date Noted  . Premature ejaculation 03/21/2018  . Tobacco abuse 05/03/2017  . Depression with anxiety 05/09/2013  . Obesity, unspecified 04/07/2013  . Insomnia 04/07/2013  . Hypothyroidism 04/07/2013  .  Essential hypertension 04/04/2013  . Grief reaction 04/04/2013    Bess Harvest, PTA 05/15/18 10:10 AM   [facility] [address]  [place], [place], [address] Phone: [phone]   Fax:  [phone]  Name: Jorma Tassinari MRN: [id number] Date of Birth: 09-Jul-1975

## 2018-05-16 ENCOUNTER — Encounter: Payer: Self-pay | Admitting: Podiatry

## 2018-05-16 ENCOUNTER — Ambulatory Visit (INDEPENDENT_AMBULATORY_CARE_PROVIDER_SITE_OTHER): Payer: BLUE CROSS/BLUE SHIELD | Admitting: Family Medicine

## 2018-05-16 ENCOUNTER — Encounter: Payer: Self-pay | Admitting: Family Medicine

## 2018-05-16 DIAGNOSIS — E6609 Other obesity due to excess calories: Secondary | ICD-10-CM | POA: Diagnosis not present

## 2018-05-16 DIAGNOSIS — I10 Essential (primary) hypertension: Secondary | ICD-10-CM | POA: Diagnosis not present

## 2018-05-16 NOTE — Progress Notes (Signed)
CC: HTN  Subjective Tony Walters is a 43 y.o. male who presents for hypertension follow up. Due to outbreak, we are interacting via web portal for an electronic face-to-face visit. I verified patient's ID using 2 identifiers.  He does not monitor home blood pressures. He is compliant with medication- HCTZ 25 mg/d. Patient has these side effects of medication: none He is adhering to a healthy diet overall. Current exercise: some walking   Past Medical History:  Diagnosis Date  . Chicken pox as a child  . Elevated BP 04/04/2013  . Grief reaction 04/04/2013  . HTN (hypertension) 04/04/2013  . PTSD (post-traumatic stress disorder)   . Unspecified hypothyroidism 04/07/2013    Review of Systems Cardiovascular: no chest pain Respiratory:  no shortness of breath  Exam No conversational dyspnea Age appropriate judgment and insight Nml affect and mood  Essential hypertension  Obesity due to excess calories without serious comorbidity, unspecified classification  Cont HCTZ. Counseled on diet and exercise. I do want him to ck his BP at home. If >140/90, I would like to see him sooner.  F/u in 6 mo. The patient voiced understanding and agreement to the plan.  [place], DO 05/16/18  8:03 AM

## 2018-05-16 NOTE — Progress Notes (Signed)
He presents today date of surgery 01/12/2018 status post Jake Michaelis bunion implant and excision plantar fibroma states that the lump on the bottom of the foot is still sore and the toe does not move as well and the shin is still numb but it seems to be doing better PT is working but they will work with me a little bit longer.  Objective: Vital signs are stable he is alert and oriented x3 small residual fibroma to the distal aspect of the incision site otherwise good range of motion of the first metatarsal phalangeal joint of the right foot.  It needs to improve considerably but it seems that the scar tissue is reducing and it is getting there.  Radiographs demonstrate well-placed Keller arthroplasty.  He has numbness along the entire anterior shin extending to the third of the saphenous nerve as well to the medial foot  Assessment: Slowly resolving surgical foot and leg right.  Plan: I am going to talk to anesthesiology about his numbness in his right leg I am going to request that we order a topical fibroma medication.  But he is going to continue physical therapy.  I will follow-up with him in a few weeks.

## 2018-05-18 ENCOUNTER — Other Ambulatory Visit: Payer: Self-pay

## 2018-05-18 ENCOUNTER — Ambulatory Visit: Payer: BLUE CROSS/BLUE SHIELD

## 2018-05-18 DIAGNOSIS — M79671 Pain in right foot: Secondary | ICD-10-CM

## 2018-05-18 DIAGNOSIS — M6281 Muscle weakness (generalized): Secondary | ICD-10-CM

## 2018-05-18 DIAGNOSIS — R262 Difficulty in walking, not elsewhere classified: Secondary | ICD-10-CM | POA: Diagnosis not present

## 2018-05-18 DIAGNOSIS — M25674 Stiffness of right foot, not elsewhere classified: Secondary | ICD-10-CM | POA: Diagnosis not present

## 2018-05-18 NOTE — Therapy (Signed)
[facility] [address]  [place], [place], [address] Phone: [phone]   Fax:  [phone]  Physical Therapy Treatment  Patient Details  Name: Tony Walters MRN: [id number] Date of Birth: 1975/01/29 Referring Provider (PT): Tyson Dense, MD   Encounter Date: 05/18/2018  PT End of Session - 05/18/18 0906    Visit Number  11    Number of Visits  13    Date for PT Re-Evaluation  05/14/18    Authorization Type  BCBS    PT Start Time  0901    PT Stop Time  0945    PT Time Calculation (min)  44 min    Activity Tolerance  Patient tolerated treatment well;Patient limited by pain    Behavior During Therapy  [facility] for tasks assessed/performed       Past Medical History:  Diagnosis Date  . Chicken pox as a child  . Elevated BP 04/04/2013  . Grief reaction 04/04/2013  . HTN (hypertension) 04/04/2013  . PTSD (post-traumatic stress disorder)   . Unspecified hypothyroidism 04/07/2013    Past Surgical History:  Procedure Laterality Date  . HYDROCELE EXCISION / REPAIR  43 years old  . WISDOM TOOTH EXTRACTION  2010    There were no vitals filed for this visit.  Subjective Assessment - 05/18/18 0905    Subjective  Pt. reporting MD wanting him to continue with therapy after recent f/u.      Pertinent History  hypothyroidism, PTSD, HTN, elevated BP    Patient Stated Goals  work on strengthening     Currently in Pain?  Yes    Pain Score  2     Pain Location  Toe (Comment which one)    Pain Orientation  Right    Pain Descriptors / Indicators  Sharp    Pain Type  Chronic pain    Pain Radiating Towards  numbness/discomfort into R anterior shin     Pain Onset  More than a month ago    Pain Frequency  Intermittent    Aggravating Factors   unsure    Multiple Pain Sites  No         [facility] PT Assessment - 05/18/18 0001      PROM   Right/Left Ankle  Right    Right Ankle Dorsiflexion  23   in wt. bearing position  seated with pt. leaning into DF                  [facility] Adult PT Treatment/Exercise - 05/18/18 0930      Manual Therapy   Manual Therapy  Joint mobilization;Passive ROM;Soft tissue mobilization    Manual therapy comments  supine     Joint Mobilization  R 1st MTP flexion, extension mobilizations grade III to tolerance    Soft tissue mobilization  STM/strumming  and scar massage to R plantar scar; pt. remains tender    Passive ROM  R 1st MTP flexion and extension PROM with prolonged holds to tolerance with gentle distraction       Ankle Exercises: Stretches   Plantar Fascia Stretch  2 reps;30 seconds    Soleus Stretch  30 seconds;2 reps    Gastroc Stretch  30 seconds;2 reps    Gastroc Stretch Limitations  at wall      Ankle Exercises: Standing   Heel Raises  Both;10 reps    Heel Raises Limitations  TRX - focusing on full great toe extension ROM  Other Standing Ankle Exercises  R hip extension fitter (1 blue, 1 black band) with toe in "toe-off position" on slider 2 x 10 reps     Other Standing Ankle Exercises  TRX squat depth to tolerance for calf stretch x 15 rpes                PT Short Term Goals - 04/24/18 0920      PT SHORT TERM GOAL #1   Title  Patient to be independent with inital HEP.    Time  3    Period  Weeks    Status  Achieved    Target Date  04/23/18        PT Long Term Goals - 05/15/18 0907      PT LONG TERM GOAL #1   Title  Patient to be independent with advanced HEP.    Time  6    Period  Weeks    Status  Partially Met      PT LONG TERM GOAL #2   Title  Patient to report tolerance of 1.5 hours of walking without c/o pain.    Time  6    Period  Weeks    Status  Achieved   05/15/18: "yeah I can do an hour and a half".     PT LONG TERM GOAL #3   Title  Patient to demonstrate [facility] R 1st MTP and ankle AROM to allow for efficient gait pattern.    Time  6    Period  Weeks    Status  Partially Met      PT LONG TERM GOAL #4   Title   Patient to demonstrate >=4+/5 R LE strength.    Time  6    Period  Weeks    Status  Achieved      PT LONG TERM GOAL #5   Title  Patient to demonstrate reciprocal step-through pattern with use of 1 handrail as needed to climb up/down 1 flight of stairs to allow safe access to home.    Time  6    Period  Weeks    Status  Achieved            Plan - 05/18/18 3500    Clinical Impression Statement  Pt. reporting MD wanting him to continue with therapy at recent f/u.  Pt. reporting he wishes to continue with therapy and notes he feels he responded well after last session without soreness.  Session focusing on MT for improved R great toe extension/flexion ROM along with functional strengthening + functional great toe extension ROM.  Pt. still with pain rising to 6/10 at times with forced wt. bearing toe extension positioning which recovers with rest.  Progressing well toward goals.      Personal Factors and Comorbidities  Past/Current Experience;Comorbidity 3+    Comorbidities  hypothyroidism, PTSD, HTN, elevated BP    Examination-Activity Limitations  Bathing;Sit;Sleep;Carry;Squat;Stairs;Stand;Lift;Transfers;Locomotion Level    Examination-Participation Restrictions  Church;Cleaning;Community Activity;Shop;Interpersonal Relationship;Yard Work;Laundry;Meal Prep    Rehab Potential  Good    PT Treatment/Interventions  ADLs/Self Care Home Management;Cryotherapy;Electrical Stimulation;Functional mobility training;Stair training;Gait training;Ultrasound;Moist Heat;Therapeutic activities;Therapeutic exercise;Balance training;Neuromuscular re-education;Patient/family education;Orthotic Fit/Training;Passive range of motion;Scar mobilization;Manual techniques;Dry needling;Energy conservation;Splinting;Taping;Vasopneumatic Device    PT Next Visit Plan  progress LE strengthening and great toe ROM    Consulted and Agree with Plan of Care  Patient       Patient will benefit from skilled therapeutic  intervention in order to improve the following deficits and  impairments:  Hypomobility, Impaired sensation, Increased edema, Decreased scar mobility, Decreased activity tolerance, Decreased strength, Pain, Increased fascial restricitons, Decreased balance, Difficulty walking, Improper body mechanics, Decreased range of motion, Postural dysfunction  Visit Diagnosis: Pain in right foot  Stiffness of right foot, not elsewhere classified  Muscle weakness (generalized)  Difficulty in walking, not elsewhere classified     Problem List Patient Active Problem List   Diagnosis Date Noted  . Premature ejaculation 03/21/2018  . Tobacco abuse 05/03/2017  . Depression with anxiety 05/09/2013  . Obesity, unspecified 04/07/2013  . Insomnia 04/07/2013  . Hypothyroidism 04/07/2013  . Essential hypertension 04/04/2013  . Grief reaction 04/04/2013    Bess Harvest, PTA 05/18/18 10:52 AM   [facility] [address]  [place], [place], [address] Phone: [phone]   Fax:  [phone]  Name: Tony Walters MRN: [id number] Date of Birth: 1975-08-17

## 2018-05-22 ENCOUNTER — Other Ambulatory Visit: Payer: Self-pay

## 2018-05-22 ENCOUNTER — Ambulatory Visit: Payer: BLUE CROSS/BLUE SHIELD

## 2018-05-22 DIAGNOSIS — M6281 Muscle weakness (generalized): Secondary | ICD-10-CM | POA: Diagnosis not present

## 2018-05-22 DIAGNOSIS — R262 Difficulty in walking, not elsewhere classified: Secondary | ICD-10-CM

## 2018-05-22 DIAGNOSIS — M79671 Pain in right foot: Secondary | ICD-10-CM | POA: Diagnosis not present

## 2018-05-22 DIAGNOSIS — M25674 Stiffness of right foot, not elsewhere classified: Secondary | ICD-10-CM

## 2018-05-22 NOTE — Therapy (Signed)
[facility] [address]  [place], [place], [address] Phone: [phone]   Fax:  [phone]  Physical Therapy Treatment  Patient Details  Name: Tony Walters MRN: [id number] Date of Birth: 12/14/1975 Referring Provider (PT): Tyson Dense, MD   Encounter Date: 05/22/2018  PT End of Session - 05/22/18 0852    Visit Number  12    Number of Visits  13    Date for PT Re-Evaluation  05/14/18    Authorization Type  BC[employer]    PT Start Time  0849    PT Stop Time  0927    PT Time Calculation (min)  38 min    Activity Tolerance  Patient tolerated treatment well;Patient limited by pain    Behavior During Therapy  [facility] for tasks assessed/performed       Past Medical History:  Diagnosis Date  . Chicken pox as a child  . Elevated BP 04/04/2013  . Grief reaction 04/04/2013  . HTN (hypertension) 04/04/2013  . PTSD (post-traumatic stress disorder)   . Unspecified hypothyroidism 04/07/2013    Past Surgical History:  Procedure Laterality Date  . HYDROCELE EXCISION / REPAIR  43 years old  . WISDOM TOOTH EXTRACTION  2010    There were no vitals filed for this visit.  Subjective Assessment - 05/22/18 0851    Subjective  Pt. reporting he felt fine over weekend.      Pertinent History  hypothyroidism, PTSD, HTN, elevated BP    Patient Stated Goals  work on strengthening     Currently in Pain?  Yes    Pain Score  3     Pain Location  Toe (Comment which one)    Pain Orientation  Right    Pain Descriptors / Indicators  Sharp    Pain Type  Chronic pain    Pain Radiating Towards  numbness/discomfort into R anterior shin     Pain Onset  More than a month ago    Pain Frequency  Intermittent    Multiple Pain Sites  No         [facility] PT Assessment - 05/22/18 0001      Assessment   Medical Diagnosis  Hallux limitus of R foot, planar fibromatosis, s/p R foot surgery    Referring Provider (PT)  Tyson Dense, MD    Onset  Date/Surgical Date  01/13/19    Next MD Visit  5.19.20                   [facility] Adult PT Treatment/Exercise - 05/22/18 0910      Ambulation/Gait   Stairs  Yes    Stairs Assistance  6: Modified independent (Device/Increase time)    Stair Management Technique  No rails;Alternating pattern    Number of Stairs  28    Height of Stairs  7    Gait Comments  Pt. able to ascend/descend stairs reciprocally with good eccentric control and only slight evidence of limited R great toe extension ROM; pt. able to perform safely; reported decreased pain as compared to ~ 1 week ago when stairs descending performed      Knee/Hip Exercises: Standing   Forward Lunges  Right;Left;10 reps    Forward Lunges Limitations  TRX focusing on R great toe functional extension stretch     Functional Squat  10 reps;3 seconds    Functional Squat Limitations  TRX - heel raise at bottom of squat  Manual Therapy   Manual Therapy  Joint mobilization;Passive ROM;Soft tissue mobilization    Manual therapy comments  supine     Joint Mobilization  R 1st MTP flexion, extension mobilizations grade III to tolerance    Soft tissue mobilization  STM/strumming  and scar massage to R plantar scar; pt. remains tender    Passive ROM  R 1st MTP flexion and extension PROM with prolonged holds to tolerance with gentle distraction       Ankle Exercises: Standing   Heel Raises  Both;15 reps    Other Standing Ankle Exercises  Heel walking at counter x 2 laps; intermittent UE support at counte r    Other Standing Ankle Exercises  TRX squat depth to tolerance for calf stretch x 15 rpes    3" hold      Ankle Exercises: Stretches   Plantar Fascia Stretch  2 reps;30 seconds    Plantar Fascia Stretch Limitations  + R great toe stretch leaning into wall     Soleus Stretch  30 seconds;2 reps    Gastroc Stretch  30 seconds;3 reps    Gastroc Stretch Limitations  at wall               PT Short Term Goals - 04/24/18  0920      PT SHORT TERM GOAL #1   Title  Patient to be independent with inital HEP.    Time  3    Period  Weeks    Status  Achieved    Target Date  04/23/18        PT Long Term Goals - 05/15/18 0907      PT LONG TERM GOAL #1   Title  Patient to be independent with advanced HEP.    Time  6    Period  Weeks    Status  Partially Met      PT LONG TERM GOAL #2   Title  Patient to report tolerance of 1.5 hours of walking without c/o pain.    Time  6    Period  Weeks    Status  Achieved   05/15/18: "yeah I can do an hour and a half".     PT LONG TERM GOAL #3   Title  Patient to demonstrate [facility] R 1st MTP and ankle AROM to allow for efficient gait pattern.    Time  6    Period  Weeks    Status  Partially Met      PT LONG TERM GOAL #4   Title  Patient to demonstrate >=4+/5 R LE strength.    Time  6    Period  Weeks    Status  Achieved      PT LONG TERM GOAL #5   Title  Patient to demonstrate reciprocal step-through pattern with use of 1 handrail as needed to climb up/down 1 flight of stairs to allow safe access to home.    Time  6    Period  Weeks    Status  Achieved            Plan - 05/22/18 0853    Clinical Impression Statement  [name] reporting he felt good over weekend.  Able to demo improved tolerance for descending stairs with decrease in R great toe pain on end range extension ROM with stairs.  This may be attributed to improving R great toe extension ROM and improved gastroc flexibility.  Session focusing on functional R ankle/great toe extension ROM  and strengthening with addition of TRX lunge and progression of squat.  Pt. ended session with pain returning to baseline.  Progressing well toward goals.      Personal Factors and Comorbidities  Past/Current Experience;Comorbidity 3+    Comorbidities  hypothyroidism, PTSD, HTN, elevated BP    Examination-Activity Limitations  Bathing;Sit;Sleep;Carry;Squat;Stairs;Stand;Lift;Transfers;Locomotion Level     Examination-Participation Restrictions  Church;Cleaning;Community Activity;Shop;Interpersonal Relationship;Yard Work;Laundry;Meal Prep    Rehab Potential  Good    PT Treatment/Interventions  ADLs/Self Care Home Management;Cryotherapy;Electrical Stimulation;Functional mobility training;Stair training;Gait training;Ultrasound;Moist Heat;Therapeutic activities;Therapeutic exercise;Balance training;Neuromuscular re-education;Patient/family education;Orthotic Fit/Training;Passive range of motion;Scar mobilization;Manual techniques;Dry needling;Energy conservation;Splinting;Taping;Vasopneumatic Device    PT Next Visit Plan  progress LE strengthening and great toe ROM    Consulted and Agree with Plan of Care  Patient       Patient will benefit from skilled therapeutic intervention in order to improve the following deficits and impairments:  Hypomobility, Impaired sensation, Increased edema, Decreased scar mobility, Decreased activity tolerance, Decreased strength, Pain, Increased fascial restricitons, Decreased balance, Difficulty walking, Improper body mechanics, Decreased range of motion, Postural dysfunction  Visit Diagnosis: Pain in right foot  Stiffness of right foot, not elsewhere classified  Muscle weakness (generalized)  Difficulty in walking, not elsewhere classified     Problem List Patient Active Problem List   Diagnosis Date Noted  . Premature ejaculation 03/21/2018  . Tobacco abuse 05/03/2017  . Depression with anxiety 05/09/2013  . Obesity, unspecified 04/07/2013  . Insomnia 04/07/2013  . Hypothyroidism 04/07/2013  . Essential hypertension 04/04/2013  . Grief reaction 04/04/2013    Bess Harvest, PTA 05/22/18 11:05 AM   [facility] [address]  [place], [place], [address] Phone: [phone]   Fax:  [phone]  Name: Brevin Mcfadden MRN: [id number] Date of Birth: 1976-01-05

## 2018-05-25 ENCOUNTER — Ambulatory Visit: Payer: BLUE CROSS/BLUE SHIELD | Attending: Podiatry | Admitting: Physical Therapy

## 2018-05-25 ENCOUNTER — Other Ambulatory Visit: Payer: Self-pay

## 2018-05-25 ENCOUNTER — Encounter: Payer: Self-pay | Admitting: Physical Therapy

## 2018-05-25 DIAGNOSIS — R262 Difficulty in walking, not elsewhere classified: Secondary | ICD-10-CM | POA: Diagnosis not present

## 2018-05-25 DIAGNOSIS — M6281 Muscle weakness (generalized): Secondary | ICD-10-CM | POA: Insufficient documentation

## 2018-05-25 DIAGNOSIS — M25674 Stiffness of right foot, not elsewhere classified: Secondary | ICD-10-CM

## 2018-05-25 DIAGNOSIS — M79671 Pain in right foot: Secondary | ICD-10-CM | POA: Diagnosis not present

## 2018-05-25 NOTE — Therapy (Signed)
[facility] [address]  [place], [place], [address] Phone: [phone]   Fax:  [phone]  Physical Therapy Treatment  Patient Details  Name: Tony Walters MRN: [id number] Date of Birth: [birth year] Referring Provider (PT): Tyson Dense, MD   Encounter Date: 05/25/2018  PT End of Session - 05/25/18 0932    Visit Number  13    Number of Visits  25    Date for PT Re-Evaluation  07/06/18    Authorization Type  [employer]    PT Start Time  0849    PT Stop Time  0939    PT Time Calculation (min)  50 min    Activity Tolerance  Patient tolerated treatment well;Patient limited by pain    Behavior During Therapy  [facility] for tasks assessed/performed       Past Medical History:  Diagnosis Date  . Chicken pox as a child  . Elevated BP 04/04/2013  . Grief reaction 04/04/2013  . HTN (hypertension) 04/04/2013  . PTSD (post-traumatic stress disorder)   . Unspecified hypothyroidism 04/07/2013    Past Surgical History:  Procedure Laterality Date  . HYDROCELE EXCISION / REPAIR  43 years old  . WISDOM TOOTH EXTRACTION  2010    There were no vitals filed for this visit.  Subjective Assessment - 05/25/18 0850    Subjective  Reports that he is having some pain but is otherwise okay. Reports 60-65% improvement since initial eval- better walking/standing tolerance and pain levels. Would like to work on standing on tippy toes without pain, be able to be on feet for 10 hours to return to work, get back into wearing work shoes without pain, balance.     Pertinent History  hypothyroidism, PTSD, HTN, elevated BP    Patient Stated Goals  work on strengthening     Currently in Pain?  Yes    Pain Score  3     Pain Location  Toe (Comment which one)   1st    Pain Orientation  Right    Pain Descriptors / Indicators  Aching    Pain Type  Chronic pain         [facility] PT Assessment - 05/25/18 0001      Assessment   Medical Diagnosis  Hallux  limitus of R foot, planar fibromatosis, s/p R foot surgery    Referring Provider (PT)  Tyson Dense, MD    Onset Date/Surgical Date  01/13/19      Observation/Other Assessments   Focus on Therapeutic Outcomes (FOTO)   45% (55% limitation)       AROM   Right Ankle Dorsiflexion  9   great toe MTP ex 20 degrees   Right Ankle Plantar Flexion  58   great toe MTP flex 35 degrees with pain   Right Ankle Inversion  27   moderate pain in toe and ankle   Right Ankle Eversion  18      Strength   Right Hip Flexion  4/5    Right Hip ABduction  4+/5    Right Hip ADduction  4+/5    Left Hip Flexion  5/5    Left Hip ABduction  4+/5    Left Hip ADduction  4+/5    Right Knee Flexion  4+/5    Right Knee Extension  4+/5    Left Knee Flexion  4/5    Left Knee Extension  4/5    Right Ankle Dorsiflexion  4+/5  R 1st digit extension 4+/5 w/ pain   Right Ankle Plantar Flexion  4-/5   R 1st digit flexion 4+/5 w/ pain   Right Ankle Inversion  4+/5    Right Ankle Eversion  4+/5    Left Ankle Dorsiflexion  4+/5    Left Ankle Plantar Flexion  4+/5    Left Ankle Inversion  4+/5    Left Ankle Eversion  4+/5                   [facility] Adult PT Treatment/Exercise - 05/25/18 0001      Ambulation/Gait   Stairs  Yes    Stairs Assistance  6: Modified independent (Device/Increase time)    Stair Management Technique  No rails;Alternating pattern    Number of Stairs  13    Height of Stairs  8    Gait Comments  slightly antalgic when ascending/descending and decreased eccentric control when descending; pt reporting pain when ascending      Lumbar Exercises: Aerobic   Recumbent Bike  Lvl 3, 6 min       Vasopneumatic   Number Minutes Vasopneumatic   15 minutes    Vasopnuematic Location   Ankle   R   Vasopneumatic Pressure  Low    Vasopneumatic Temperature   Coldest temp.        Ankle Exercises: Standing   SLS  R SLS on foam 2x20"    intermittent UE support on chair   Heel Raises  Both;10  reps   at chair            PT Education - 05/25/18 0926    Education Details  update and consolidation of HEP; discussion on objective progress thus far and importance of consistency with HEP    Person(s) Educated  Patient    Methods  Explanation;Demonstration;Tactile cues;Verbal cues;Handout    Comprehension  Verbalized understanding;Returned demonstration       PT Short Term Goals - 05/25/18 0934      PT SHORT TERM GOAL #1   Title  Patient to be independent with inital HEP.    Time  3    Period  Weeks    Status  Achieved    Target Date  04/23/18        PT Long Term Goals - 05/25/18 0934      PT LONG TERM GOAL #1   Title  Patient to be independent with advanced HEP.    Time  6    Period  Weeks    Status  Partially Met   performing HEP 2x/week   Target Date  07/06/18      PT LONG TERM GOAL #2   Title  Patient to report tolerance of 1.5 hours of walking without c/o pain.    Time  6    Period  Weeks    Status  Achieved   05/15/18: "yeah I can do an hour and a half".     PT LONG TERM GOAL #3   Title  Patient to demonstrate [facility] R 1st MTP and ankle AROM to allow for efficient gait pattern.    Time  6    Period  Weeks    Status  Partially Met   Improvements demonstrated in R ankle plantarflexion and R great toe MTP flexion and extension   Target Date  07/06/18      PT LONG TERM GOAL #4   Title  Patient to demonstrate >=4+/5 R LE strength.  Time  6    Period  Weeks    Status  Partially Met   still showing impairments in R hip flexion, L knee flexion/extension, and R plantarflexion strength   Target Date  07/06/18      PT LONG TERM GOAL #5   Title  Patient to demonstrate reciprocal step-through pattern with use of 1 handrail as needed to climb up/down 1 flight of stairs to allow safe access to home.    Time  6    Period  Weeks    Status  Partially Met   slightly antalgic when ascending/descending stairs and decreased eccentric control when descending; pt  reporting pain when ascending   Target Date  07/06/18      Additional Long Term Goals   Additional Long Term Goals  Yes      PT LONG TERM GOAL #6   Title  Patient to report tolerance of wearing work shoe on R foot for 8 hours with <=2/10 pain.     Time  6    Period  Weeks    Status  New    Target Date  07/06/18      PT LONG TERM GOAL #7   Title  Patient to tolerate SLS on foam on R LE for 30 sec without LOB.    Time  6    Period  Weeks    Status  New    Target Date  07/06/18            Plan - 05/25/18 0947    Clinical Impression Statement  Patient arrived to session with report of 60-65% improvement in R great toe since starting PT, noting improvements in standing and walking tolerance as well as pain levels. Patient would like to continue working on prolonged standing tolerance, ability to balance in a heel raise, and tolerate wearing work shoes for extended period of time in order to return to work. Patient is showing improvements in R ankle plantarflexion and R great toe MTP flexion and extension AROM. Strength testing revealed remaining impairments in R hip flexion, L knee flexion/extension, and R plantarflexion strength. Patient slightly antalgic on R LE when ascending/descending stairs and demonstrating decreased eccentric control when descending. Patient still reporting pain when ascending stairs at this time. Patient has met prolonged standing/walking tolerance goal at this time. Adjusted goals to reflect patient's work requirement for hopeful return to work and PLOF. Took time to review and consolidate HEP with patient for improved compliance as patient with report of performing HEP 2x/week. Received consolidated HEP and reported understanding. Ended session with [place] to R foot for pain relief; reported no pain at end of session. Patient would benefit from additional skilled PT services 2x/week for 6 weeks to address aforementioned impairments and return to work.      Comorbidities  hypothyroidism, PTSD, HTN, elevated BP    PT Frequency  2x / week    PT Duration  6 weeks    PT Treatment/Interventions  ADLs/Self Care Home Management;Cryotherapy;Electrical Stimulation;Functional mobility training;Stair training;Gait training;Ultrasound;Moist Heat;Therapeutic activities;Therapeutic exercise;Balance training;Neuromuscular re-education;Patient/family education;Orthotic Fit/Training;Passive range of motion;Scar mobilization;Manual techniques;Dry needling;Energy conservation;Splinting;Taping;Vasopneumatic Device    PT Next Visit Plan  progress LE strengthening and great toe ROM    Consulted and Agree with Plan of Care  Patient       Patient will benefit from skilled therapeutic intervention in order to improve the following deficits and impairments:  Hypomobility, Impaired sensation, Increased edema, Decreased scar mobility, Decreased activity tolerance, Decreased strength,  Pain, Increased fascial restricitons, Decreased balance, Difficulty walking, Improper body mechanics, Decreased range of motion, Postural dysfunction  Visit Diagnosis: Pain in right foot  Stiffness of right foot, not elsewhere classified  Muscle weakness (generalized)  Difficulty in walking, not elsewhere classified     Problem List Patient Active Problem List   Diagnosis Date Noted  . Premature ejaculation 03/21/2018  . Tobacco abuse 05/03/2017  . Depression with anxiety 05/09/2013  . Obesity, unspecified 04/07/2013  . Insomnia 04/07/2013  . Hypothyroidism 04/07/2013  . Essential hypertension 04/04/2013  . Grief reaction 04/04/2013     Janene Harvey, PT, DPT 05/25/18 9:58 AM   [facility] [address]  [place], [place], [address] Phone: [phone]   Fax:  [phone]  Name: Jahziel Sinn MRN: [id number] Date of Birth: April 12, 1975

## 2018-05-29 ENCOUNTER — Ambulatory Visit: Payer: BLUE CROSS/BLUE SHIELD | Admitting: Physical Therapy

## 2018-05-29 ENCOUNTER — Other Ambulatory Visit: Payer: Self-pay

## 2018-05-29 ENCOUNTER — Encounter: Payer: Self-pay | Admitting: Physical Therapy

## 2018-05-29 DIAGNOSIS — R262 Difficulty in walking, not elsewhere classified: Secondary | ICD-10-CM | POA: Diagnosis not present

## 2018-05-29 DIAGNOSIS — M79671 Pain in right foot: Secondary | ICD-10-CM | POA: Diagnosis not present

## 2018-05-29 DIAGNOSIS — M25674 Stiffness of right foot, not elsewhere classified: Secondary | ICD-10-CM | POA: Diagnosis not present

## 2018-05-29 DIAGNOSIS — M6281 Muscle weakness (generalized): Secondary | ICD-10-CM | POA: Diagnosis not present

## 2018-05-29 NOTE — Therapy (Signed)
[facility] [address]  [place], [place], [address] Phone: [phone]   Fax:  [phone]  Physical Therapy Treatment  Patient Details  Name: Tony Walters MRN: [id number] Date of Birth: 14-Feb-1975 Referring Provider (PT): Tyson Dense, MD   Encounter Date: 05/29/2018  PT End of Session - 05/29/18 0926    Visit Number  14    Number of Visits  25    Date for PT Re-Evaluation  07/06/18    Authorization Type  BCBS    PT Start Time  [phone]    PT Stop Time  0940    PT Time Calculation (min)  54 min    Activity Tolerance  Patient tolerated treatment well;Patient limited by pain    Behavior During Therapy  [facility] for tasks assessed/performed       Past Medical History:  Diagnosis Date  . Chicken pox as a child  . Elevated BP 04/04/2013  . Grief reaction 04/04/2013  . HTN (hypertension) 04/04/2013  . PTSD (post-traumatic stress disorder)   . Unspecified hypothyroidism 04/07/2013    Past Surgical History:  Procedure Laterality Date  . HYDROCELE EXCISION / REPAIR  43 years old  . WISDOM TOOTH EXTRACTION  2010    There were no vitals filed for this visit.  Subjective Assessment - 05/29/18 0846    Subjective  Feels a little achey in the R toe after performing his stretches this AM. Has been more consistent with HEP since last session.     Pertinent History  hypothyroidism, PTSD, HTN, elevated BP    Patient Stated Goals  work on strengthening     Currently in Pain?  Yes    Pain Score  4     Pain Location  Toe (Comment which one)   1st digit   Pain Orientation  Right    Pain Descriptors / Indicators  Aching    Pain Type  Chronic pain                       [facility] Adult PT Treatment/Exercise - 05/29/18 0001      Exercises   Exercises  Knee/Hip      Lumbar Exercises: Aerobic   Recumbent Bike  Lvl 3, 6 min       Knee/Hip Exercises: Machines for Strengthening   Cybex Leg Press  R LE 10x20#; R LE  heel raise 15x15#      Knee/Hip Exercises: Standing   Other Standing Knee Exercises  toe walking 2x15 along treadmill    10/10 pain- discontinued     Vasopneumatic   Number Minutes Vasopneumatic   15 minutes    Vasopnuematic Location   Ankle   R   Vasopneumatic Pressure  Medium    Vasopneumatic Temperature   Coldest temp.        Manual Therapy   Manual Therapy  Joint mobilization;Passive ROM;Soft tissue mobilization    Manual therapy comments  supine     Joint Mobilization  R 1st MTP flexion, extension mobilizations grade III to tolerance    Soft tissue mobilization  STM to R plantar scar- most tenderness along distal and proximal ends    Passive ROM  R 1st MTP flexion and extension PROM with prolonged holds to tolerance with gentle distraction       Ankle Exercises: Seated   Other Seated Ankle Exercises  R great toe flexion & extension self-mobilizations 20x3" each way   stopped at  11 reps of flexion d/t pain     Ankle Exercises: Standing   Heel Raises  Right;15 reps    Heel Raises Limitations  R conc/ecc heel raise at treadmill rail    Other Standing Ankle Exercises  sidestepping with red TB at toes 4x24f    Other Standing Ankle Exercises  L LE split squats 10x at treadmill rail   focusing on R toe stretch              PT Short Term Goals - 05/25/18 0934      PT SHORT TERM GOAL #1   Title  Patient to be independent with inital HEP.    Time  3    Period  Weeks    Status  Achieved    Target Date  04/23/18        PT Long Term Goals - 05/29/18 1026      PT LONG TERM GOAL #1   Title  Patient to be independent with advanced HEP.    Time  6    Period  Weeks    Status  Partially Met   performing HEP 2x/week     PT LONG TERM GOAL #2   Title  Patient to report tolerance of 1.5 hours of walking without c/o pain.    Time  6    Period  Weeks    Status  Achieved   05/15/18: "yeah I can do an hour and a half".     PT LONG TERM GOAL #3   Title  Patient to  demonstrate [facility] and ankle AROM to allow for efficient gait pattern.    Time  6    Period  Weeks    Status  Partially Met   Improvements demonstrated in R ankle plantarflexion and R great toe MTP flexion and extension     PT LONG TERM GOAL #4   Title  Patient to demonstrate >=4+/5 R LE strength.    Time  6    Period  Weeks    Status  Partially Met   still showing impairments in R hip flexion, L knee flexion/extension, and R plantarflexion strength     PT LONG TERM GOAL #5   Title  Patient to demonstrate reciprocal step-through pattern with use of 1 handrail as needed to climb up/down 1 flight of stairs to allow safe access to home.    Time  6    Period  Weeks    Status  Partially Met   slightly antalgic when ascending/descending stairs and decreased eccentric control when descending; pt reporting pain when ascending     PT LONG TERM GOAL #6   Title  Patient to report tolerance of wearing work shoe on R foot for 8 hours with <=2/10 pain.     Time  6    Period  Weeks    Status  On-going      PT LONG TERM GOAL #7   Title  Patient to tolerate SLS on foam on R LE for 30 sec without LOB.    Time  6    Period  Weeks    Status  On-going            Plan - 05/29/18 06144   Clinical Impression Statement  Patient arrived to session with report of mild soreness in R 1st digit with AM and intermittent "charley horse" in R plantar arch while performing stretches at home. Mentioned improved compliance with HEP compared to  last session. Advised patient to use tennis ball massage to ease muscle spasm pain. Patient tolerated manual therapy to R foot and great toe- patient with good tolerance of PROM, most tenderness with STM to distal and proximal ends of plantar incision. Challenged calf strength and great toe extension ROM with toe walking which patient reported severe pain with, thus it was discontinued. Plan to attempt again next session. Also reported pain with great toe flexion in  sitting, thus cut reps in half. Good tolerance of concentric/eccentric heel raises on R LE, with cues to shift weight further to R. Patient reported R foot soreness at end of session, thus ended session with [place] to R foot. No complaints at end of session.     PT Treatment/Interventions  ADLs/Self Care Home Management;Cryotherapy;Electrical Stimulation;Functional mobility training;Stair training;Gait training;Ultrasound;Moist Heat;Therapeutic activities;Therapeutic exercise;Balance training;Neuromuscular re-education;Patient/family education;Orthotic Fit/Training;Passive range of motion;Scar mobilization;Manual techniques;Dry needling;Energy conservation;Splinting;Taping;Vasopneumatic Device    PT Next Visit Plan  progress LE strengthening and great toe ROM    Consulted and Agree with Plan of Care  Patient       Patient will benefit from skilled therapeutic intervention in order to improve the following deficits and impairments:  Hypomobility, Impaired sensation, Increased edema, Decreased scar mobility, Decreased activity tolerance, Decreased strength, Pain, Increased fascial restricitons, Decreased balance, Difficulty walking, Improper body mechanics, Decreased range of motion, Postural dysfunction  Visit Diagnosis: Pain in right foot  Stiffness of right foot, not elsewhere classified  Muscle weakness (generalized)  Difficulty in walking, not elsewhere classified     Problem List Patient Active Problem List   Diagnosis Date Noted  . Premature ejaculation 03/21/2018  . Tobacco abuse 05/03/2017  . Depression with anxiety 05/09/2013  . Obesity, unspecified 04/07/2013  . Insomnia 04/07/2013  . Hypothyroidism 04/07/2013  . Essential hypertension 04/04/2013  . Grief reaction 04/04/2013    Janene Harvey, PT, DPT 05/29/18 10:29 AM    [facility] [address]  [place], [place], [address] Phone: [phone]    Fax:  [phone]  Name: Tony Walters MRN: [id number] Date of Birth: Feb 19, 1975

## 2018-06-01 ENCOUNTER — Ambulatory Visit: Payer: BLUE CROSS/BLUE SHIELD

## 2018-06-01 ENCOUNTER — Other Ambulatory Visit: Payer: Self-pay

## 2018-06-01 DIAGNOSIS — R262 Difficulty in walking, not elsewhere classified: Secondary | ICD-10-CM | POA: Diagnosis not present

## 2018-06-01 DIAGNOSIS — M6281 Muscle weakness (generalized): Secondary | ICD-10-CM

## 2018-06-01 DIAGNOSIS — M25674 Stiffness of right foot, not elsewhere classified: Secondary | ICD-10-CM

## 2018-06-01 DIAGNOSIS — M79671 Pain in right foot: Secondary | ICD-10-CM

## 2018-06-01 NOTE — Therapy (Signed)
[facility] [address]  [place], [place], [address] Phone: [phone]   Fax:  [phone]  Physical Therapy Treatment  Patient Details  Name: Tony Walters MRN: [id number] Date of Birth: May 02, 1975 Referring Provider (PT): Tyson Dense, MD   Encounter Date: 06/01/2018  PT End of Session - 06/01/18 0853    Visit Number  15    Number of Visits  25    Date for PT Re-Evaluation  07/06/18    Authorization Type  BCBS    PT Start Time  0845    PT Stop Time  0940    PT Time Calculation (min)  55 min    Activity Tolerance  Patient tolerated treatment well;Patient limited by pain    Behavior During Therapy  [facility] for tasks assessed/performed       Past Medical History:  Diagnosis Date  . Chicken pox as a child  . Elevated BP 04/04/2013  . Grief reaction 04/04/2013  . HTN (hypertension) 04/04/2013  . PTSD (post-traumatic stress disorder)   . Unspecified hypothyroidism 04/07/2013    Past Surgical History:  Procedure Laterality Date  . HYDROCELE EXCISION / REPAIR  43 years old  . WISDOM TOOTH EXTRACTION  2010    There were no vitals filed for this visit.  Subjective Assessment - 06/01/18 0852    Subjective  Pt. noting increased soreness in R great toe after last session.     Pertinent History  hypothyroidism, PTSD, HTN, elevated BP    Patient Stated Goals  work on strengthening     Currently in Pain?  Yes    Pain Score  5     Pain Location  Toe (Comment which one)   R gret toe    Pain Orientation  Right    Pain Descriptors / Indicators  Sharp;Stabbing    Pain Type  Chronic pain    Pain Radiating Towards  numbness/discomfrot into R anterior shin     Pain Onset  More than a month ago    Pain Frequency  Intermittent    Multiple Pain Sites  No                       [facility] Adult PT Treatment/Exercise - 06/01/18 0001      Lumbar Exercises: Aerobic   Recumbent Bike  Lvl 3, 7 min       Knee/Hip  Exercises: Supine   Bridges  Both;15 reps    Bridges Limitations  toes elevated off mat table       Vasopneumatic   Number Minutes Vasopneumatic   15 minutes    Vasopnuematic Location   Ankle    Vasopneumatic Pressure  Medium    Vasopneumatic Temperature   Coldest temp.        Manual Therapy   Manual Therapy  Joint mobilization;Passive ROM;Soft tissue mobilization    Manual therapy comments  supine     Joint Mobilization  R 1st MTP flexion, extension mobilizations grade III to tolerance    Soft tissue mobilization  STM to R plantar scar     Passive ROM  R 1st MTP flexion and extension PROM with prolonged holds to tolerance with gentle distraction       Ankle Exercises: Standing   SLS  R SLS on foam 2x30 sec; 1st set with intermittent UE support at counter required; 2nd set with sustained finger support at counter with eyes closed  Heel Raises  Right;15 reps    Heel Raises Limitations  R conc/ecc heel raise at treadmill rail    Heel Walk (Round Trip)  8 x 15 ft at counter     Toe Walk (Round Trip)  x 4 x 15 ft    at counter      Ankle Exercises: Stretches   Plantar Fascia Stretch  2 reps;30 seconds    Plantar Fascia Stretch Limitations  + R great toe stretch leaning into wall    standing    Soleus Stretch  30 seconds;2 reps    Gastroc Stretch  30 seconds;2 reps    Gastroc Stretch Limitations  at wall               PT Short Term Goals - 05/25/18 0934      PT SHORT TERM GOAL #1   Title  Patient to be independent with inital HEP.    Time  3    Period  Weeks    Status  Achieved    Target Date  04/23/18        PT Long Term Goals - 05/29/18 1026      PT LONG TERM GOAL #1   Title  Patient to be independent with advanced HEP.    Time  6    Period  Weeks    Status  Partially Met   performing HEP 2x/week     PT LONG TERM GOAL #2   Title  Patient to report tolerance of 1.5 hours of walking without c/o pain.    Time  6    Period  Weeks    Status  Achieved    05/15/18: "yeah I can do an hour and a half".     PT LONG TERM GOAL #3   Title  Patient to demonstrate [facility] R 1st MTP and ankle AROM to allow for efficient gait pattern.    Time  6    Period  Weeks    Status  Partially Met   Improvements demonstrated in R ankle plantarflexion and R great toe MTP flexion and extension     PT LONG TERM GOAL #4   Title  Patient to demonstrate >=4+/5 R LE strength.    Time  6    Period  Weeks    Status  Partially Met   still showing impairments in R hip flexion, L knee flexion/extension, and R plantarflexion strength     PT LONG TERM GOAL #5   Title  Patient to demonstrate reciprocal step-through pattern with use of 1 handrail as needed to climb up/down 1 flight of stairs to allow safe access to home.    Time  6    Period  Weeks    Status  Partially Met   slightly antalgic when ascending/descending stairs and decreased eccentric control when descending; pt reporting pain when ascending     PT LONG TERM GOAL #6   Title  Patient to report tolerance of wearing work shoe on R foot for 8 hours with <=2/10 pain.     Time  6    Period  Weeks    Status  On-going      PT LONG TERM GOAL #7   Title  Patient to tolerate SLS on foam on R LE for 30 sec without LOB.    Time  6    Period  Weeks    Status  On-going            Plan -  06/01/18 0910    Clinical Impression Statement  [place] reporting increased R toe pain/general soreness on Thursday and denies feeling this after last session.  Unsure of trigger for this soreness.  Tolerated continued MT focus on improving R great toe ext./flexion ROM and all calf/hamstring strengthening activities well today.  Does require occasional breaks with toe walking today due to increased R toe pain which resolves with rest.  Ended visit with ice/compression to R ankle/foot per pt. request as he feels this improves his post-exercise soreness response.  Will monitor response to today's therex and continue to progress toward  goals in coming sessions.      Personal Factors and Comorbidities  Past/Current Experience;Comorbidity 3+    Comorbidities  hypothyroidism, PTSD, HTN, elevated BP    Examination-Activity Limitations  Bathing;Sit;Sleep;Carry;Squat;Stairs;Stand;Lift;Transfers;Locomotion Level    Examination-Participation Restrictions  Church;Cleaning;Community Activity;Shop;Interpersonal Relationship;Yard Work;Laundry;Meal Prep    Stability/Clinical Decision Making  Evolving/Moderate complexity    PT Treatment/Interventions  ADLs/Self Care Home Management;Cryotherapy;Electrical Stimulation;Functional mobility training;Stair training;Gait training;Ultrasound;Moist Heat;Therapeutic activities;Therapeutic exercise;Balance training;Neuromuscular re-education;Patient/family education;Orthotic Fit/Training;Passive range of motion;Scar mobilization;Manual techniques;Dry needling;Energy conservation;Splinting;Taping;Vasopneumatic Device    PT Next Visit Plan  Monitor tolerance to toe walking; progress LE strengthening and great toe ROM    Consulted and Agree with Plan of Care  Patient       Patient will benefit from skilled therapeutic intervention in order to improve the following deficits and impairments:  Hypomobility, Impaired sensation, Increased edema, Decreased scar mobility, Decreased activity tolerance, Decreased strength, Pain, Increased fascial restricitons, Decreased balance, Difficulty walking, Improper body mechanics, Decreased range of motion, Postural dysfunction  Visit Diagnosis: Pain in right foot  Stiffness of right foot, not elsewhere classified  Muscle weakness (generalized)  Difficulty in walking, not elsewhere classified     Problem List Patient Active Problem List   Diagnosis Date Noted  . Premature ejaculation 03/21/2018  . Tobacco abuse 05/03/2017  . Depression with anxiety 05/09/2013  . Obesity, unspecified 04/07/2013  . Insomnia 04/07/2013  . Hypothyroidism 04/07/2013  . Essential  hypertension 04/04/2013  . Grief reaction 04/04/2013    Bess Harvest, PTA 06/01/18 10:53 AM   [facility] [address]  [place], [place], [address] Phone: [phone]   Fax:  [phone]  Name: Arland Usery MRN: [id number] Date of Birth: 23-Jul-1975

## 2018-06-05 ENCOUNTER — Ambulatory Visit: Payer: BLUE CROSS/BLUE SHIELD | Admitting: Physical Therapy

## 2018-06-05 ENCOUNTER — Other Ambulatory Visit: Payer: Self-pay

## 2018-06-05 ENCOUNTER — Encounter: Payer: Self-pay | Admitting: Physical Therapy

## 2018-06-05 DIAGNOSIS — R262 Difficulty in walking, not elsewhere classified: Secondary | ICD-10-CM | POA: Diagnosis not present

## 2018-06-05 DIAGNOSIS — M6281 Muscle weakness (generalized): Secondary | ICD-10-CM

## 2018-06-05 DIAGNOSIS — M79671 Pain in right foot: Secondary | ICD-10-CM | POA: Diagnosis not present

## 2018-06-05 DIAGNOSIS — M25674 Stiffness of right foot, not elsewhere classified: Secondary | ICD-10-CM | POA: Diagnosis not present

## 2018-06-05 NOTE — Therapy (Signed)
[facility] [address]  [place], [place], [address] Phone: [phone]   Fax:  [phone]  Physical Therapy Treatment  Patient Details  Name: Tony Walters MRN: [id number] Date of Birth: 1976-01-23 Referring Provider (PT): Tyson Dense, MD   Encounter Date: 06/05/2018  PT End of Session - 06/05/18 0929    Visit Number  16    Number of Visits  25    Date for PT Re-Evaluation  07/06/18    Authorization Type  BCBS    PT Start Time  [phone]    PT Stop Time  0930    PT Time Calculation (min)  43 min    Activity Tolerance  Patient tolerated treatment well;Patient limited by pain    Behavior During Therapy  [facility] for tasks assessed/performed       Past Medical History:  Diagnosis Date  . Chicken pox as a child  . Elevated BP 04/04/2013  . Grief reaction 04/04/2013  . HTN (hypertension) 04/04/2013  . PTSD (post-traumatic stress disorder)   . Unspecified hypothyroidism 04/07/2013    Past Surgical History:  Procedure Laterality Date  . HYDROCELE EXCISION / REPAIR  43 years old  . WISDOM TOOTH EXTRACTION  2010    There were no vitals filed for this visit.  Subjective Assessment - 06/05/18 0847    Subjective  Reports he has good days and bad days. Still having weight when putting weight over the R toe with heel raises. Has had a consistent ache in the R toe since Saturday.     Pertinent History  hypothyroidism, PTSD, HTN, elevated BP    Patient Stated Goals  work on strengthening     Currently in Pain?  Yes    Pain Score  4     Pain Location  Toe (Comment which one)    Pain Orientation  Right    Pain Descriptors / Indicators  Aching    Pain Type  Chronic pain                       [facility] Adult PT Treatment/Exercise - 06/05/18 0001      Lumbar Exercises: Aerobic   Recumbent Bike  Lvl 3, 6 min       Manual Therapy   Manual Therapy  Joint mobilization;Passive ROM;Soft tissue mobilization    Manual therapy comments  supine     Joint Mobilization  R 1st MTP flexion, extension mobilizations grade III to tolerance    Soft tissue mobilization  STM to R plantar scar     Passive ROM  R 1st MTP flexion and extension PROM with prolonged holds to tolerance with gentle distraction    intermittent c/o R great toe pain at end ranges     Ankle Exercises: Seated   Towel Crunch  2 reps   R foot   [place]up  R foot x6 min   c/o muscle fatigue in R hip flexor and foot   Other Seated Ankle Exercises  Seated arch creation 1 set 5"x10 reps       Ankle Exercises: Stretches   Gastroc Stretch  30 seconds;2 reps    Gastroc Stretch Limitations  prostretch at counter top      Ankle Exercises: Standing   Vector Stance Limitations  R & L hip abduction/adduction with red TB and chair support x15 each LE each side    Heel Raises  Right;10 reps    Heel  Raises Limitations  assisted heel raise at lat pull bar at 175#   decreased ROM; 8/10 pain in R toe              PT Short Term Goals - 05/25/18 0934      PT SHORT TERM GOAL #1   Title  Patient to be independent with inital HEP.    Time  3    Period  Weeks    Status  Achieved    Target Date  04/23/18        PT Long Term Goals - 05/29/18 1026      PT LONG TERM GOAL #1   Title  Patient to be independent with advanced HEP.    Time  6    Period  Weeks    Status  Partially Met   performing HEP 2x/week     PT LONG TERM GOAL #2   Title  Patient to report tolerance of 1.5 hours of walking without c/o pain.    Time  6    Period  Weeks    Status  Achieved   05/15/18: "yeah I can do an hour and a half".     PT LONG TERM GOAL #3   Title  Patient to demonstrate WFL R 1st MTP and ankle AROM to allow for efficient gait pattern.    Time  6    Period  Weeks    Status  Partially Met   Improvements demonstrated in R ankle plantarflexion and R great toe MTP flexion and extension     PT LONG TERM GOAL #4   Title  Patient to demonstrate  >=4+/5 R LE strength.    Time  6    Period  Weeks    Status  Partially Met   still showing impairments in R hip flexion, L knee flexion/extension, and R plantarflexion strength     PT LONG TERM GOAL #5   Title  Patient to demonstrate reciprocal step-through pattern with use of 1 handrail as needed to climb up/down 1 flight of stairs to allow safe access to home.    Time  6    Period  Weeks    Status  Partially Met   slightly antalgic when ascending/descending stairs and decreased eccentric control when descending; pt reporting pain when ascending     PT LONG TERM GOAL #6   Title  Patient to report tolerance of wearing work shoe on R foot for 8 hours with <=2/10 pain.     Time  6    Period  Weeks    Status  On-going      PT LONG TERM GOAL #7   Title  Patient to tolerate SLS on foam on R LE for 30 sec without LOB.    Time  6    Period  Weeks    Status  On-going            Plan - 06/05/18 0934    Clinical Impression Statement  Patient arrived to session with report of constant aching in R toe since Saturday. Notes that he still has pain with WBing over R foot. Patient reporting pain at end ranges of R great toe flexion and extension PROM during manual therapy. Patient still limited in ROM, but slight observable improvement in extension PROM. Also still reporting pain at proximal and distal ends of plantar excision. Introduced assisted R LE heel raise- patient reporting pain and decreased ROM compared to L LE. Able to perform standing   hip abduction and adduction with banded resistance with good form and tolerance. Ended session with no complaints. Patient progressing per POC.     Comorbidities  hypothyroidism, PTSD, HTN, elevated BP    PT Treatment/Interventions  ADLs/Self Care Home Management;Cryotherapy;Electrical Stimulation;Functional mobility training;Stair training;Gait training;Ultrasound;Moist Heat;Therapeutic activities;Therapeutic exercise;Balance training;Neuromuscular  re-education;Patient/family education;Orthotic Fit/Training;Passive range of motion;Scar mobilization;Manual techniques;Dry needling;Energy conservation;Splinting;Taping;Vasopneumatic Device    PT Next Visit Plan  MD F/U note on 05/15; Monitor tolerance to toe walking; progress LE strengthening and great toe ROM    Consulted and Agree with Plan of Care  Patient       Patient will benefit from skilled therapeutic intervention in order to improve the following deficits and impairments:  Hypomobility, Impaired sensation, Increased edema, Decreased scar mobility, Decreased activity tolerance, Decreased strength, Pain, Increased fascial restricitons, Decreased balance, Difficulty walking, Improper body mechanics, Decreased range of motion, Postural dysfunction  Visit Diagnosis: Pain in right foot  Stiffness of right foot, not elsewhere classified  Muscle weakness (generalized)  Difficulty in walking, not elsewhere classified     Problem List Patient Active Problem List   Diagnosis Date Noted  . Premature ejaculation 03/21/2018  . Tobacco abuse 05/03/2017  . Depression with anxiety 05/09/2013  . Obesity, unspecified 04/07/2013  . Insomnia 04/07/2013  . Hypothyroidism 04/07/2013  . Essential hypertension 04/04/2013  . Grief reaction 04/04/2013    Janene Harvey, PT, DPT 06/05/18 9:40 AM    [facility] [address]  [place], [place], [address] Phone: [phone]   Fax:  [phone]  Name: Tony Walters MRN: [id number] Date of Birth: Nov 13, 1975

## 2018-06-08 ENCOUNTER — Ambulatory Visit: Payer: BLUE CROSS/BLUE SHIELD

## 2018-06-08 ENCOUNTER — Other Ambulatory Visit: Payer: Self-pay

## 2018-06-08 DIAGNOSIS — M79671 Pain in right foot: Secondary | ICD-10-CM

## 2018-06-08 DIAGNOSIS — M6281 Muscle weakness (generalized): Secondary | ICD-10-CM | POA: Diagnosis not present

## 2018-06-08 DIAGNOSIS — R262 Difficulty in walking, not elsewhere classified: Secondary | ICD-10-CM

## 2018-06-08 DIAGNOSIS — M25674 Stiffness of right foot, not elsewhere classified: Secondary | ICD-10-CM | POA: Diagnosis not present

## 2018-06-08 NOTE — Therapy (Signed)
[facility] [address]  [place], [place], [address] Phone: [phone]   Fax:  [phone]  Physical Therapy Treatment  Patient Details  Name: Tony Walters MRN: [id number] Date of Birth: [birth year] Referring Provider (PT): Tyson Dense, MD   Encounter Date: 06/08/2018  PT End of Session - 06/08/18 0851    Visit Number  17    Number of Visits  25    Date for PT Re-Evaluation  07/06/18    Authorization Type  BC[employer]    PT Start Time  0845    PT Stop Time  0936    PT Time Calculation (min)  51 min    Activity Tolerance  Patient tolerated treatment well;Patient limited by pain    Behavior During Therapy  [facility] for tasks assessed/performed       Past Medical History:  Diagnosis Date  . Chicken pox as a child  . Elevated BP 04/04/2013  . Grief reaction 04/04/2013  . HTN (hypertension) 04/04/2013  . PTSD (post-traumatic stress disorder)   . Unspecified hypothyroidism 04/07/2013    Past Surgical History:  Procedure Laterality Date  . HYDROCELE EXCISION / REPAIR  43 years old  . WISDOM TOOTH EXTRACTION  2010    There were no vitals filed for this visit.  Subjective Assessment - 06/08/18 0847    Subjective  Pt. noting sharp pain at R foot, "isn't as frequent now".  Felt fine after last visit.  Pt. noting some improvement in walking pain since starting therapy.      Pertinent History  hypothyroidism, PTSD, HTN, elevated BP    Patient Stated Goals  work on strengthening     Currently in Pain?  No/denies    Pain Score  0-No pain   4/10 pain at most while walking    Pain Location  Other (Comment)   R great toe    Pain Orientation  Right    Pain Descriptors / Indicators  Aching    Pain Type  Chronic pain    Pain Radiating Towards  numbness/discomfort into R anterior shin     Pain Onset  More than a month ago    Pain Frequency  Intermittent    Multiple Pain Sites  No                       [facility]  Adult PT Treatment/Exercise - 06/08/18 0001      Lumbar Exercises: Aerobic   Recumbent Bike  Lvl 4, 7 min       Knee/Hip Exercises: Machines for Strengthening   Cybex Leg Press  Straight leg heel raise 25# x 15 rpes       Knee/Hip Exercises: Supine   Bridges  Both;15 reps;2 sets    Bridges Limitations  toes elevated off mat table       Vasopneumatic   Number Minutes Vasopneumatic   10 minutes    Vasopnuematic Location   Ankle    Vasopneumatic Pressure  Medium    Vasopneumatic Temperature   Coldest temp.        Manual Therapy   Manual Therapy  Joint mobilization;Passive ROM;Soft tissue mobilization    Manual therapy comments  supine     Joint Mobilization  R 1st MTP flexion, extension mobilizations grade III to tolerance    Soft tissue mobilization  STM/DTM to R plantar scar     Passive ROM  R 1st MTP flexion and extension PROM with  prolonged holds to tolerance with gentle distraction    increased R great toe pain with passive stretch which subsid     Ankle Exercises: Standing   Vector Stance  1 rep   30 sec    Vector Stance Limitations  R SLS on foam ovel with cone toe-tap x 30 sec ; intermittent counter support    Heel Walk (Round Trip)  8 x 15 ft at counter     Toe Walk (Round Trip)  x 8 x 15 ft at counter       Ankle Exercises: Stretches   Plantar Fascia Stretch  2 reps;30 seconds    Plantar Fascia Stretch Limitations  + R great toe stretch leaning into wall     Soleus Stretch  30 seconds;2 reps    Gastroc Stretch  30 seconds;2 reps    Gastroc Stretch Limitations  prostretch at counter top      Ankle Exercises: Machines for Strengthening   Cybex Leg Press  R only: 25# x 15 reps                PT Short Term Goals - 05/25/18 0934      PT SHORT TERM GOAL #1   Title  Patient to be independent with inital HEP.    Time  3    Period  Weeks    Status  Achieved    Target Date  04/23/18        PT Long Term Goals - 05/29/18 1026      PT LONG TERM GOAL #1    Title  Patient to be independent with advanced HEP.    Time  6    Period  Weeks    Status  Partially Met   performing HEP 2x/week     PT LONG TERM GOAL #2   Title  Patient to report tolerance of 1.5 hours of walking without c/o pain.    Time  6    Period  Weeks    Status  Achieved   05/15/18: "yeah I can do an hour and a half".     PT LONG TERM GOAL #3   Title  Patient to demonstrate [facility] R 1st MTP and ankle AROM to allow for efficient gait pattern.    Time  6    Period  Weeks    Status  Partially Met   Improvements demonstrated in R ankle plantarflexion and R great toe MTP flexion and extension     PT LONG TERM GOAL #4   Title  Patient to demonstrate >=4+/5 R LE strength.    Time  6    Period  Weeks    Status  Partially Met   still showing impairments in R hip flexion, L knee flexion/extension, and R plantarflexion strength     PT LONG TERM GOAL #5   Title  Patient to demonstrate reciprocal step-through pattern with use of 1 handrail as needed to climb up/down 1 flight of stairs to allow safe access to home.    Time  6    Period  Weeks    Status  Partially Met   slightly antalgic when ascending/descending stairs and decreased eccentric control when descending; pt reporting pain when ascending     PT LONG TERM GOAL #6   Title  Patient to report tolerance of wearing work shoe on R foot for 8 hours with <=2/10 pain.     Time  6    Period  Weeks  Status  On-going      PT LONG TERM GOAL #7   Title  Patient to tolerate SLS on foam on R LE for 30 sec without LOB.    Time  6    Period  Weeks    Status  On-going            Plan - 06/08/18 0851    Clinical Impression Statement  Tyquon doing well today with no new complaints.  Able to demo imrpoved tolerance for increased distance toe-walking at counter and progression of heel raise on leg press thus demonstrating improved functional PF strength.  Pt. progressing toward LTG #4.  Pt. to see MD for f/u next week.  Ended  session with ice/compression to R ankle/foot to reduce post-exercise pain and swelling.      Personal Factors and Comorbidities  Past/Current Experience;Comorbidity 3+    Comorbidities  hypothyroidism, PTSD, HTN, elevated BP    Examination-Activity Limitations  Bathing;Sit;Sleep;Carry;Squat;Stairs;Stand;Lift;Transfers;Locomotion Level    Examination-Participation Restrictions  Church;Cleaning;Community Activity;Shop;Interpersonal Relationship;Yard Work;Laundry;Meal Prep    PT Treatment/Interventions  ADLs/Self Care Home Management;Cryotherapy;Electrical Stimulation;Functional mobility training;Stair training;Gait training;Ultrasound;Moist Heat;Therapeutic activities;Therapeutic exercise;Balance training;Neuromuscular re-education;Patient/family education;Orthotic Fit/Training;Passive range of motion;Scar mobilization;Manual techniques;Dry needling;Energy conservation;Splinting;Taping;Vasopneumatic Device    PT Next Visit Plan  MD F/U note on 05/19; Monitor tolerance to toe walking; progress LE strengthening and great toe ROM    Consulted and Agree with Plan of Care  Patient       Patient will benefit from skilled therapeutic intervention in order to improve the following deficits and impairments:  Hypomobility, Impaired sensation, Increased edema, Decreased scar mobility, Decreased activity tolerance, Decreased strength, Pain, Increased fascial restricitons, Decreased balance, Difficulty walking, Improper body mechanics, Decreased range of motion, Postural dysfunction  Visit Diagnosis: Pain in right foot  Stiffness of right foot, not elsewhere classified  Muscle weakness (generalized)  Difficulty in walking, not elsewhere classified     Problem List Patient Active Problem List   Diagnosis Date Noted  . Premature ejaculation 03/21/2018  . Tobacco abuse 05/03/2017  . Depression with anxiety 05/09/2013  . Obesity, unspecified 04/07/2013  . Insomnia 04/07/2013  . Hypothyroidism  04/07/2013  . Essential hypertension 04/04/2013  . Grief reaction 04/04/2013    Bess Harvest, PTA 06/08/18 12:23 PM   [facility] [address]  [place], [place], [address] Phone: [phone]   Fax:  [phone]  Name: Ayoub Arey MRN: [id number] Date of Birth: [birth year]

## 2018-06-12 ENCOUNTER — Other Ambulatory Visit: Payer: Self-pay

## 2018-06-12 ENCOUNTER — Ambulatory Visit (INDEPENDENT_AMBULATORY_CARE_PROVIDER_SITE_OTHER): Payer: BLUE CROSS/BLUE SHIELD

## 2018-06-12 ENCOUNTER — Ambulatory Visit: Payer: BLUE CROSS/BLUE SHIELD | Admitting: Physical Therapy

## 2018-06-12 ENCOUNTER — Encounter: Payer: Self-pay | Admitting: Physical Therapy

## 2018-06-12 ENCOUNTER — Ambulatory Visit: Payer: BLUE CROSS/BLUE SHIELD | Admitting: Podiatry

## 2018-06-12 ENCOUNTER — Encounter: Payer: Self-pay | Admitting: Podiatry

## 2018-06-12 VITALS — Temp 97.7°F

## 2018-06-12 DIAGNOSIS — Z9889 Other specified postprocedural states: Secondary | ICD-10-CM

## 2018-06-12 DIAGNOSIS — M205X1 Other deformities of toe(s) (acquired), right foot: Secondary | ICD-10-CM

## 2018-06-12 DIAGNOSIS — M25674 Stiffness of right foot, not elsewhere classified: Secondary | ICD-10-CM | POA: Diagnosis not present

## 2018-06-12 DIAGNOSIS — M79671 Pain in right foot: Secondary | ICD-10-CM

## 2018-06-12 DIAGNOSIS — R262 Difficulty in walking, not elsewhere classified: Secondary | ICD-10-CM

## 2018-06-12 DIAGNOSIS — M6281 Muscle weakness (generalized): Secondary | ICD-10-CM | POA: Diagnosis not present

## 2018-06-12 DIAGNOSIS — M722 Plantar fascial fibromatosis: Secondary | ICD-10-CM

## 2018-06-12 MED ORDER — DICLOFENAC SODIUM 1 % TD GEL: 4.0000 g | Freq: Four times a day (QID) | TRANSDERMAL | 2 refills | Status: DC

## 2018-06-12 NOTE — Therapy (Signed)
[facility] [address]  [place], [place], [address] Phone: [phone]   Fax:  [phone]  Physical Therapy Treatment  Patient Details  Name: Tony Walters MRN: [id number] Date of Birth: [birth year] Referring Provider (PT): Tyson Dense, MD   Encounter Date: 06/12/2018  PT End of Session - 06/12/18 1103    Visit Number  18    Number of Visits  25    Date for PT Re-Evaluation  07/06/18    Authorization Type  [employer]BS    PT Start Time  0905    PT Stop Time  0948    PT Time Calculation (min)  43 min    Activity Tolerance  Patient tolerated treatment well;Patient limited by pain    Behavior During Therapy  [facility] for tasks assessed/performed       Past Medical History:  Diagnosis Date  . Chicken pox as a child  . Elevated BP 04/04/2013  . Grief reaction 04/04/2013  . HTN (hypertension) 04/04/2013  . PTSD (post-traumatic stress disorder)   . Unspecified hypothyroidism 04/07/2013    Past Surgical History:  Procedure Laterality Date  . HYDROCELE EXCISION / REPAIR  43 years old  . WISDOM TOOTH EXTRACTION  2010    There were no vitals filed for this visit.  Subjective Assessment - 06/12/18 0906    Subjective  Reports that he got a mosquito bite on his R shin but unable to feel it. Notes 70-75% improvement with PT. Feels he has improved in "everything but the weightbearing." Still having pain with putting weight through R toe/foot.     Pertinent History  hypothyroidism, PTSD, HTN, elevated BP    Patient Stated Goals  work on strengthening     Currently in Pain?  Yes    Pain Score  2     Pain Location  Toe (Comment which one)   great toe   Pain Orientation  Right    Pain Descriptors / Indicators  Aching    Pain Type  Chronic pain         [facility] PT Assessment - 06/12/18 0001      AROM   Right Ankle Dorsiflexion  11   great toe MTP ex 43 degrees    Right Ankle Plantar Flexion  52   great toe flexion 45  degrees and with pain   Right Ankle Inversion  37    Right Ankle Eversion  16      Strength   Right Hip Flexion  5/5    Right Hip ABduction  4+/5    Right Hip ADduction  4+/5    Left Hip Flexion  5/5    Left Hip ABduction  4+/5    Left Hip ADduction  4+/5    Right Knee Flexion  4+/5    Right Knee Extension  4+/5    Left Knee Flexion  5/5    Left Knee Extension  4+/5    Right Ankle Dorsiflexion  4+/5    Right Ankle Plantar Flexion  4+/5   10 reps with limited ROM and pain   Right Ankle Inversion  4+/5    Right Ankle Eversion  4+/5    Left Ankle Dorsiflexion  4+/5    Left Ankle Plantar Flexion  5/5   20 reps with limited ROM   Left Ankle Inversion  4+/5    Left Ankle Eversion  4+/5      High Level Balance   High Level  Balance Activites  Other (comment)   R SLS on foam: 17.4 sec                  [facility] Adult PT Treatment/Exercise - 06/12/18 0001      Ambulation/Gait   Stairs  Yes    Stairs Assistance  7: Independent    Stair Management Technique  No rails;Alternating pattern    Number of Stairs  26    Height of Stairs  8    Gait Comments  good eccentric control when descending but with c/o 7/10 pain in R great toe      Lumbar Exercises: Aerobic   Recumbent Bike  Lvl 4, 6 min       Knee/Hip Exercises: Standing   Functional Squat  20 reps    Functional Squat Limitations  mini squat + heel raise while in squat at treadmill rail    Other Standing Knee Exercises  sidestepping with green TB around ankles 2x62f; around toes 2x232f  good form     Ankle Exercises: Stretches   Plantar Fascia Stretch  2 reps;30 seconds    Plantar Fascia Stretch Limitations  + R great toe stretch leaning into wall       Ankle Exercises: Standing   SLS  R SLS on foam + basketball bounce/catch x5 min   rest breaks in between d/t fatigue            PT Education - 06/12/18 0949    Education Details  update to HEP; discussion on objective progress with PT    Person(s)  Educated  Patient    Methods  Explanation;Demonstration;Tactile cues;Verbal cues;Handout    Comprehension  Verbalized understanding;Returned demonstration       PT Short Term Goals - 06/12/18 0909      PT SHORT TERM GOAL #1   Title  Patient to be independent with inital HEP.    Time  3    Period  Weeks    Status  Achieved    Target Date  04/23/18        PT Long Term Goals - 06/12/18 0909      PT LONG TERM GOAL #1   Title  Patient to be independent with advanced HEP.    Time  6    Period  Weeks    Status  Partially Met   met for current     PT LONG TERM GOAL #2   Title  Patient to report tolerance of 1.5 hours of walking without c/o pain.    Time  6    Period  Weeks    Status  Achieved   05/15/18: "yeah I can do an hour and a half".     PT LONG TERM GOAL #3   Title  [facility] to demonstrate [facility] 1[facility] MTP and ankle AROM to allow for efficient gait pattern.    Time  6    Period  Weeks    Status  Partially Met   improvements demonstrated in R ankle dorsiflexion and inversion and R great toe flexion and extension     PT LONG TERM GOAL #4   Title  Patient to demonstrate >=4+/5 R LE strength.    Time  6    Period  Weeks    Status  Partially Met   showing improvements in R hip flexion, L knee flexion and extension, and B ankle plantarflexion strength, with R plantarflexion strength most limiting      PT LONG  TERM GOAL #5   Title  Patient to demonstrate reciprocal step-through pattern with use of 1 handrail as needed to climb up/down 1 flight of stairs to allow safe access to home.    Time  6    Period  Weeks    Status  Partially Met   good form with ascending and descending steps, but reporting up to 7/10 pain in R great toe     PT LONG TERM GOAL #6   Title  Patient to report tolerance of wearing work shoe on R foot for 8 hours with <=2/10 pain.     Time  6    Period  Weeks    Status  Partially Met   tolerated 4 hours with up to 6-7/10 pain     PT LONG TERM GOAL #7    Title  Patient to tolerate SLS on foam on R LE for 30 sec without LOB.    Time  6    Period  Weeks    Status  Partially Met   17.4 sec before LOB on foam           Plan - 06/12/18 1107    Clinical Impression Statement  Patient arrived to session with report of 70-75% improvement in R foot since starting PT. Notes that he is still having pain with weightbearing over the R toe/foot. Patient reporting improved compliance with HEP. Patient showing improvements in R ankle dorsiflexion and inversion and R great toe flexion and extension AROM. Strength testing revealed good improvements in R hip flexion, L knee flexion and extension, and B ankle plantarflexion strength, with R plantarflexion strength most limiting at this time. Patient now able to demonstrate good form with ascending and descending steps without use of handrail, but reporting up to 7/10 pain in R great toe. Able to demonstrate 17.4 sec before LOB on foam. Patient is making good ROM, strength, and balance improvements, with pain seeming to be most limiting factor at this time. Able to tolerate all LE strengthening ther-ex today with good form but with intermittent c/o R toe pain. Updated HEP with single leg raise- patient reported understanding. Declined modalities at end of session.    Comorbidities  hypothyroidism, PTSD, HTN, elevated BP    PT Treatment/Interventions  ADLs/Self Care Home Management;Cryotherapy;Electrical Stimulation;Functional mobility training;Stair training;Gait training;Ultrasound;Moist Heat;Therapeutic activities;Therapeutic exercise;Balance training;Neuromuscular re-education;Patient/family education;Orthotic Fit/Training;Passive range of motion;Scar mobilization;Manual techniques;Dry needling;Energy conservation;Splinting;Taping;Vasopneumatic Device    PT Next Visit Plan  single leg heel raise; Monitor tolerance to toe walking; progress LE strengthening and great toe ROM    Consulted and Agree with Plan of Care   Patient       Patient will benefit from skilled therapeutic intervention in order to improve the following deficits and impairments:  Hypomobility, Impaired sensation, Increased edema, Decreased scar mobility, Decreased activity tolerance, Decreased strength, Pain, Increased fascial restricitons, Decreased balance, Difficulty walking, Improper body mechanics, Decreased range of motion, Postural dysfunction  Visit Diagnosis: Pain in right foot  Stiffness of right foot, not elsewhere classified  Muscle weakness (generalized)  Difficulty in walking, not elsewhere classified     Problem List Patient Active Problem List   Diagnosis Date Noted  . Premature ejaculation 03/21/2018  . Tobacco abuse 05/03/2017  . Depression with anxiety 05/09/2013  . Obesity, unspecified 04/07/2013  . Insomnia 04/07/2013  . Hypothyroidism 04/07/2013  . Essential hypertension 04/04/2013  . Grief reaction 04/04/2013    Janene Harvey, PT, DPT 06/12/18 11:13 AM    [facility] [address]  [place], [place], [address] Phone: [phone]   Fax:  [phone]  Name: Toddrick Sanna MRN: [id number] Date of Birth: 03-24-1975

## 2018-06-13 ENCOUNTER — Encounter: Payer: Self-pay | Admitting: Podiatry

## 2018-06-13 MED ORDER — NONFORMULARY OR COMPOUNDED ITEM: 3 refills | Status: DC

## 2018-06-13 NOTE — Progress Notes (Signed)
He presents today for follow-up of his Keller bunionectomy and plantar fibroma excision.  He said it feels like about another small knot coming up right here as he points to the distal aspect of the incision.  I asked him if he had received his medication for his fibroma he states that he never got a phone call.  I also asked him how he was doing in physical therapy states that he is doing well and seems to be improving.  I reevaluated physical therapy today.  Objective: Vital signs are stable alert and oriented x3 physical therapy states that he is approximately 75% improved.  Plantar fibroma to the distalmost aspect of the incision appears to actually be getting smaller rather than larger.  Is a better range of motion of the first metatarsal phalangeal joint.  Radiographs do demonstrate some lateral impingement of this implant in the first metatarsal phalangeal joint.  Assessment improving postsurgical foot.  Plan: Discussed etiology pathology conservative therapies I this point would not suggest he continue physical therapy reordered the Voltaren gel and the fibroma compound.  I will follow-up with him in 4 to 6 weeks.

## 2018-06-13 NOTE — Addendum Note (Signed)
Addended by: Harriett Sine D on: 06/13/2018 10:53 AM   Modules accepted: Orders

## 2018-06-15 ENCOUNTER — Other Ambulatory Visit: Payer: Self-pay

## 2018-06-15 ENCOUNTER — Ambulatory Visit: Payer: BLUE CROSS/BLUE SHIELD

## 2018-06-15 DIAGNOSIS — M79671 Pain in right foot: Secondary | ICD-10-CM

## 2018-06-15 DIAGNOSIS — R262 Difficulty in walking, not elsewhere classified: Secondary | ICD-10-CM | POA: Diagnosis not present

## 2018-06-15 DIAGNOSIS — M6281 Muscle weakness (generalized): Secondary | ICD-10-CM

## 2018-06-15 DIAGNOSIS — M25674 Stiffness of right foot, not elsewhere classified: Secondary | ICD-10-CM

## 2018-06-15 NOTE — Therapy (Signed)
[facility] [address]  [place], [place], [address] Phone: [phone]   Fax:  [phone]  Physical Therapy Treatment  Patient Details  Name: Tony Walters MRN: [id number] Date of Birth: 02-14-1975 Referring Provider (PT): Tyson Dense, MD   Encounter Date: 06/15/2018  PT End of Session - 06/15/18 0923    Visit Number  19    Number of Visits  25    Date for PT Re-Evaluation  07/06/18    Authorization Type  BCBS    PT Start Time  0900    PT Stop Time  0956    PT Time Calculation (min)  56 min    Activity Tolerance  Patient tolerated treatment well;Patient limited by pain    Behavior During Therapy  [facility] for tasks assessed/performed       Past Medical History:  Diagnosis Date  . Chicken pox as a child  . Elevated BP 04/04/2013  . Grief reaction 04/04/2013  . HTN (hypertension) 04/04/2013  . PTSD (post-traumatic stress disorder)   . Unspecified hypothyroidism 04/07/2013    Past Surgical History:  Procedure Laterality Date  . HYDROCELE EXCISION / REPAIR  43 years old  . WISDOM TOOTH EXTRACTION  2010    There were no vitals filed for this visit.  Subjective Assessment - 06/15/18 0907    Subjective  Pt. reporting MD f/u went well.  Continue with PT.      Pertinent History  hypothyroidism, PTSD, HTN, elevated BP    Patient Stated Goals  work on strengthening     Currently in Pain?  Yes    Pain Score  2     Pain Location  --   toe   Pain Orientation  Right    Pain Descriptors / Indicators  Aching    Pain Onset  More than a month ago    Multiple Pain Sites  No                       [facility] Adult PT Treatment/Exercise - 06/15/18 0001      Lumbar Exercises: Aerobic   Recumbent Bike  Lvl 4, 6 min       Knee/Hip Exercises: Supine   Bridges  Both;15 reps;1 set    Bridges Limitations  toes elevated off table; 5" hold       Vasopneumatic   Number Minutes Vasopneumatic   15 minutes    Vasopnuematic Location   Ankle   R   Vasopneumatic Pressure  Medium    Vasopneumatic Temperature   Coldest temp.        Manual Therapy   Manual Therapy  Joint mobilization;Passive ROM;Soft tissue mobilization    Manual therapy comments  supine     Joint Mobilization  R 1st MTP flexion, extension mobilizations grade III to tolerance    Soft tissue mobilization  DTM to R plantar scar     Passive ROM  R 1st MTP flexion and extension PROM with prolonged holds to tolerance with gentle distraction       Ankle Exercises: Standing   SLS  R SLS on firm surface 2 x 30 sec with basketball toss; difficulty maintaining stability    Heel Raises  Right;10 reps    Heel Raises Limitations  2 sets at counter   limited ROM last few reps each set   Heel Walk (Round Trip)  2 x 90 ft     Toe Walk (Round  Trip)  2 x 90 ft       Ankle Exercises: Stretches   Plantar Fascia Stretch  2 reps;30 seconds    Plantar Fascia Stretch Limitations  + R great toe stretch leaning into wall     Soleus Stretch  30 seconds;2 reps    Soleus Stretch Limitations  wall     Gastroc Stretch  30 seconds;2 reps    Gastroc Stretch Limitations  wall                PT Short Term Goals - 06/12/18 0909      PT SHORT TERM GOAL #1   Title  Patient to be independent with inital HEP.    Time  3    Period  Weeks    Status  Achieved    Target Date  04/23/18        PT Long Term Goals - 06/12/18 0909      PT LONG TERM GOAL #1   Title  Patient to be independent with advanced HEP.    Time  6    Period  Weeks    Status  Partially Met   met for current     PT LONG TERM GOAL #2   Title  Patient to report tolerance of 1.5 hours of walking without c/o pain.    Time  6    Period  Weeks    Status  Achieved   05/15/18: "yeah I can do an hour and a half".     PT LONG TERM GOAL #3   Title  Patient to demonstrate [facility] R 1st MTP and ankle AROM to allow for efficient gait pattern.    Time  6    Period  Weeks    Status   Partially Met   improvements demonstrated in R ankle dorsiflexion and inversion and R great toe flexion and extension     PT LONG TERM GOAL #4   Title  Patient to demonstrate >=4+/5 R LE strength.    Time  6    Period  Weeks    Status  Partially Met   showing improvements in R hip flexion, L knee flexion and extension, and B ankle plantarflexion strength, with R plantarflexion strength most limiting      PT LONG TERM GOAL #5   Title  Patient to demonstrate reciprocal step-through pattern with use of 1 handrail as needed to climb up/down 1 flight of stairs to allow safe access to home.    Time  6    Period  Weeks    Status  Partially Met   good form with ascending and descending steps, but reporting up to 7/10 pain in R great toe     PT LONG TERM GOAL #6   Title  Patient to report tolerance of wearing work shoe on R foot for 8 hours with <=2/10 pain.     Time  6    Period  Weeks    Status  Partially Met   tolerated 4 hours with up to 6-7/10 pain     PT LONG TERM GOAL #7   Title  Patient to tolerate SLS on foam on R LE for 30 sec without LOB.    Time  6    Period  Weeks    Status  Partially Met   17.4 sec before LOB on foam           Plan - 06/15/18 0923    Clinical Impression Statement  Pt. reporting MD f/u went and MD wanting him to continue with therapy.  Able to progress single leg calf raise and toe walking today with typical reported increase in pain which subsided with rest.  Progressing well toward goals.      Personal Factors and Comorbidities  Past/Current Experience;Comorbidity 3+    Comorbidities  hypothyroidism, PTSD, HTN, elevated BP    Examination-Activity Limitations  Bathing;Sit;Sleep;Carry;Squat;Stairs;Stand;Lift;Transfers;Locomotion Level    Examination-Participation Restrictions  Church;Cleaning;Community Activity;Shop;Interpersonal Relationship;Yard Work;Laundry;Meal Prep    PT Treatment/Interventions  ADLs/Self Care Home  Management;Cryotherapy;Electrical Stimulation;Functional mobility training;Stair training;Gait training;Ultrasound;Moist Heat;Therapeutic activities;Therapeutic exercise;Balance training;Neuromuscular re-education;Patient/family education;Orthotic Fit/Training;Passive range of motion;Scar mobilization;Manual techniques;Dry needling;Energy conservation;Splinting;Taping;Vasopneumatic Device    PT Next Visit Plan  single leg heel raise; progress LE strengthening and great toe ROM    Consulted and Agree with Plan of Care  Patient       Patient will benefit from skilled therapeutic intervention in order to improve the following deficits and impairments:  Hypomobility, Impaired sensation, Increased edema, Decreased scar mobility, Decreased activity tolerance, Decreased strength, Pain, Increased fascial restricitons, Decreased balance, Difficulty walking, Improper body mechanics, Decreased range of motion, Postural dysfunction  Visit Diagnosis: Pain in right foot  Stiffness of right foot, not elsewhere classified  Muscle weakness (generalized)  Difficulty in walking, not elsewhere classified     Problem List Patient Active Problem List   Diagnosis Date Noted  . Premature ejaculation 03/21/2018  . Tobacco abuse 05/03/2017  . Depression with anxiety 05/09/2013  . Obesity, unspecified 04/07/2013  . Insomnia 04/07/2013  . Hypothyroidism 04/07/2013  . Essential hypertension 04/04/2013  . Grief reaction 04/04/2013    Bess Harvest, PTA 06/15/18 9:59 AM    [facility] [address]  [place], [place], [address] Phone: [phone]   Fax:  [phone]  Name: Tony Walters MRN: [id number] Date of Birth: 07/23/1975

## 2018-06-19 ENCOUNTER — Ambulatory Visit: Payer: BLUE CROSS/BLUE SHIELD

## 2018-06-19 ENCOUNTER — Other Ambulatory Visit: Payer: Self-pay

## 2018-06-19 DIAGNOSIS — M25674 Stiffness of right foot, not elsewhere classified: Secondary | ICD-10-CM

## 2018-06-19 DIAGNOSIS — R262 Difficulty in walking, not elsewhere classified: Secondary | ICD-10-CM

## 2018-06-19 DIAGNOSIS — M79671 Pain in right foot: Secondary | ICD-10-CM | POA: Diagnosis not present

## 2018-06-19 DIAGNOSIS — M6281 Muscle weakness (generalized): Secondary | ICD-10-CM

## 2018-06-19 NOTE — Therapy (Signed)
[facility] [address]  [place], [place], [address] Phone: [phone]   Fax:  [phone]  Physical Therapy Treatment  Patient Details  Name: Tony Walters MRN: [id number] Date of Birth: 1975-04-14 Referring Provider (PT): Tyson Dense, MD   Encounter Date: 06/19/2018  PT End of Session - 06/19/18 0911    Visit Number  20    Number of Visits  25    Date for PT Re-Evaluation  07/06/18    Authorization Type  BCBS    PT Start Time  0904    PT Stop Time  0951    PT Time Calculation (min)  47 min    Activity Tolerance  Patient tolerated treatment well    Behavior During Therapy  [facility] for tasks assessed/performed       Past Medical History:  Diagnosis Date  . Chicken pox as a child  . Elevated BP 04/04/2013  . Grief reaction 04/04/2013  . HTN (hypertension) 04/04/2013  . PTSD (post-traumatic stress disorder)   . Unspecified hypothyroidism 04/07/2013    Past Surgical History:  Procedure Laterality Date  . HYDROCELE EXCISION / REPAIR  43 years old  . WISDOM TOOTH EXTRACTION  2010    There were no vitals filed for this visit.  Subjective Assessment - 06/19/18 0909    Subjective  Pt. doing well today.  Notes some soreness from single leg calf raise he has been performing consistently at home.      Pertinent History  hypothyroidism, PTSD, HTN, elevated BP    Patient Stated Goals  work on strengthening     Currently in Pain?  Yes    Pain Orientation  Right    Pain Descriptors / Indicators  Aching    Pain Type  Chronic pain    Pain Radiating Towards  Numbness/discomfort into R anterior shin     Pain Frequency  Intermittent    Aggravating Factors   single leg heel raise     Multiple Pain Sites  No                       [facility] Adult PT Treatment/Exercise - 06/19/18 0001      Lumbar Exercises: Aerobic   Recumbent Bike  Lvl 4, 7 min       Manual Therapy   Manual Therapy  Joint  mobilization;Passive ROM;Soft tissue mobilization    Manual therapy comments  supine     Joint Mobilization  R ankle talocrural A/P mobs; R 1st MTP flexion, extension mobilizations grade III to tolerance    Soft tissue mobilization  DTM to R plantar scar     Passive ROM  R 1st MTP flexion and extension PROM with prolonged holds to tolerance with gentle distraction       Ankle Exercises: Standing   SLS  R SLS + 4-way hip kicker with green TB on foam with 1 ski pole x 10 reps    Heel Walk (Round Trip)  2 x 90 ft     Toe Walk (Round Trip)  2 x 90 ft       Ankle Exercises: Machines for Strengthening   Cybex Leg Press  R only: 30# x 20 reps      Ankle Exercises: Stretches   Plantar Fascia Stretch  2 reps;30 seconds    Plantar Fascia Stretch Limitations  + R great toe stretch leaning into wall     Soleus Stretch  30  seconds;2 reps    Soleus Stretch Limitations  wall     Gastroc Stretch  30 seconds;2 reps    Gastroc Stretch Limitations  wall                PT Short Term Goals - 06/12/18 0909      PT SHORT TERM GOAL #1   Title  Patient to be independent with inital HEP.    Time  3    Period  Weeks    Status  Achieved    Target Date  04/23/18        PT Long Term Goals - 06/12/18 0909      PT LONG TERM GOAL #1   Title  Patient to be independent with advanced HEP.    Time  6    Period  Weeks    Status  Partially Met   met for current     PT LONG TERM GOAL #2   Title  Patient to report tolerance of 1.5 hours of walking without c/o pain.    Time  6    Period  Weeks    Status  Achieved   05/15/18: "yeah I can do an hour and a half".     PT LONG TERM GOAL #3   Title  Patient to demonstrate [facility] R 1st MTP and ankle AROM to allow for efficient gait pattern.    Time  6    Period  Weeks    Status  Partially Met   improvements demonstrated in R ankle dorsiflexion and inversion and R great toe flexion and extension     PT LONG TERM GOAL #4   Title  Patient to demonstrate  >=4+/5 R LE strength.    Time  6    Period  Weeks    Status  Partially Met   showing improvements in R hip flexion, L knee flexion and extension, and B ankle plantarflexion strength, with R plantarflexion strength most limiting      PT LONG TERM GOAL #5   Title  Patient to demonstrate reciprocal step-through pattern with use of 1 handrail as needed to climb up/down 1 flight of stairs to allow safe access to home.    Time  6    Period  Weeks    Status  Partially Met   good form with ascending and descending steps, but reporting up to 7/10 pain in R great toe     PT LONG TERM GOAL #6   Title  Patient to report tolerance of wearing work shoe on R foot for 8 hours with <=2/10 pain.     Time  6    Period  Weeks    Status  Partially Met   tolerated 4 hours with up to 6-7/10 pain     PT LONG TERM GOAL #7   Title  Patient to tolerate SLS on foam on R LE for 30 sec without LOB.    Time  6    Period  Weeks    Status  Partially Met   17.4 sec before LOB on foam           Plan - 06/19/18 0911    Clinical Impression Statement  Keifer reporting some R calf/R great toe soreness after consistently performing R single leg calf raise over weekend.  Without complaint of soreness in session today.  Able to progress 4-way hip kicker to decreased UE support on foam today with red TB resistance focusing on R SLS  stability.  Ended session with R great toe pain returning to baseline.  Pt. declined modalities to end session.  Progressing well toward goals.        Personal Factors and Comorbidities  Past/Current Experience;Comorbidity 3+    Comorbidities  hypothyroidism, PTSD, HTN, elevated BP    Examination-Activity Limitations  Bathing;Sit;Sleep;Carry;Squat;Stairs;Stand;Lift;Transfers;Locomotion Level    Examination-Participation Restrictions  Church;Cleaning;Community Activity;Shop;Interpersonal Relationship;Yard Work;Laundry;Meal Prep    PT Treatment/Interventions  ADLs/Self Care Home  Management;Cryotherapy;Electrical Stimulation;Functional mobility training;Stair training;Gait training;Ultrasound;Moist Heat;Therapeutic activities;Therapeutic exercise;Balance training;Neuromuscular re-education;Patient/family education;Orthotic Fit/Training;Passive range of motion;Scar mobilization;Manual techniques;Dry needling;Energy conservation;Splinting;Taping;Vasopneumatic Device    PT Next Visit Plan  single leg heel raise; progress LE strengthening and great toe ROM    Consulted and Agree with Plan of Care  Patient       Patient will benefit from skilled therapeutic intervention in order to improve the following deficits and impairments:  Hypomobility, Impaired sensation, Increased edema, Decreased scar mobility, Decreased activity tolerance, Decreased strength, Pain, Increased fascial restricitons, Decreased balance, Difficulty walking, Improper body mechanics, Decreased range of motion, Postural dysfunction  Visit Diagnosis: Pain in right foot  Stiffness of right foot, not elsewhere classified  Muscle weakness (generalized)  Difficulty in walking, not elsewhere classified     Problem List Patient Active Problem List   Diagnosis Date Noted  . Premature ejaculation 03/21/2018  . Tobacco abuse 05/03/2017  . Depression with anxiety 05/09/2013  . Obesity, unspecified 04/07/2013  . Insomnia 04/07/2013  . Hypothyroidism 04/07/2013  . Essential hypertension 04/04/2013  . Grief reaction 04/04/2013    Bess Harvest, PTA 06/19/18 10:03 AM   [facility] [address]  [place], [place], [address] Phone: [phone]   Fax:  [phone]  Name: Justus Droke MRN: [id number] Date of Birth: 16-Dec-1975

## 2018-06-22 ENCOUNTER — Ambulatory Visit: Payer: Self-pay | Admitting: *Deleted

## 2018-06-22 ENCOUNTER — Ambulatory Visit: Payer: BLUE CROSS/BLUE SHIELD | Admitting: Physical Therapy

## 2018-06-22 NOTE — Telephone Encounter (Signed)
Pt called with some symptoms and wanted to be tested for the covid-19. He had a fever today it was 101, took Motrin and it came down to 98. He denies having a cough. He has had chills and he has traveled to [place] to see friends. Did not wear a mask. He goes to [place] frequently he stated to visit friends.  He denies shortness of breath, chest pain or loss of sense of smell or taste. Advised that he stay quarantine as much as possibly. Stay hydrated and monitor his temp.  Virtual appointment scheduled for tomorrow. Pt voiced understanding Routing to [facility] at [facility] for review. Reason for Disposition . COVID-19 Testing, questions about  Answer Assessment - Initial Assessment Questions 1. COVID-19 DIAGNOSIS: "Who made your Coronavirus (COVID-19) diagnosis?" "Was it confirmed by a positive lab test?" If not diagnosed by a HCP, ask "Are there lots of cases (community spread) where you live?" (See public health department website, if unsure)   * MAJOR community spread: high number of cases; numbers of cases are increasing; many people hospitalized.   * MINOR community spread: low number of cases; not increasing; few or no people hospitalized In the community 2. ONSET: "When did the COVID-19 symptoms start?"      yesterday 3. WORST SYMPTOM: "What is your worst symptom?" (e.g., cough, fever, shortness of breath, muscle aches)     dehydration 4. COUGH: "Do you have a cough?" If so, ask: "How bad is the cough?"       no 5. FEVER: "Do you have a fever?" If so, ask: "What is your temperature, how was it measured, and when did it start?"     Was 101 and now 98 took motrin 6. RESPIRATORY STATUS: "Describe your breathing?" (e.g., shortness of breath, wheezing, unable to speak)      Normal breathing 7. BETTER-SAME-WORSE: "Are you getting better, staying the same or getting worse compared to yesterday?"  If getting worse, ask, "In what way?"     better 8. HIGH RISK DISEASE: "Do you have any chronic medical  problems?" (e.g., asthma, heart or lung disease, weak immune system, etc.)     no 9. PREGNANCY: "Is there any chance you are pregnant?" "When was your last menstrual period?"     no 10. OTHER SYMPTOMS: "Do you have any other symptoms?"  (e.g., runny nose, headache, sore throat, loss of smell)       no  Protocols used: CORONAVIRUS (COVID-19) DIAGNOSED OR SUSPECTED-A-AH

## 2018-06-23 ENCOUNTER — Other Ambulatory Visit: Payer: Self-pay

## 2018-06-23 ENCOUNTER — Encounter: Payer: Self-pay | Admitting: Family Medicine

## 2018-06-23 ENCOUNTER — Ambulatory Visit (INDEPENDENT_AMBULATORY_CARE_PROVIDER_SITE_OTHER): Payer: BLUE CROSS/BLUE SHIELD | Admitting: Family Medicine

## 2018-06-23 DIAGNOSIS — R509 Fever, unspecified: Secondary | ICD-10-CM | POA: Diagnosis not present

## 2018-06-23 DIAGNOSIS — K5900 Constipation, unspecified: Secondary | ICD-10-CM | POA: Diagnosis not present

## 2018-06-23 NOTE — Progress Notes (Signed)
I have discussed the procedure for the virtual visit with the patient who has given consent to proceed with assessment and treatment.   Tony Walters L Deryl Giroux, CMA     

## 2018-06-23 NOTE — Progress Notes (Signed)
Virtual Visit via Video   I connected with patient on 06/23/18 at 11:00 AM EDT by a video enabled telemedicine application and verified that I am speaking with the correct person using two identifiers.  Location patient: Home Location provider: Acupuncturist, Office Persons participating in the virtual visit: Patient, Provider, Harris (Jess B)  I discussed the limitations of evaluation and management by telemedicine and the availability of in person appointments. The patient expressed understanding and agreed to proceed.  Subjective:   HPI:   Constipation- 'my tummy is done.  It's knotted up'.  Last [facility] Wednesday.  Typically regular, 'I pride myself on it'.  No change in medications or diet.  No N/V.  Took Miralax ~1 hr ago.    Fever- pt had subjective fever on Thursday, yesterday had temp to 101.  Took Motrin and this improved.  + chills.  No body aches.  No cough or SOB.  No HAs.  No changes to sense of taste or smell.  No fever since 3pm yesterday.  ROS:   See pertinent positives and negatives per HPI.  Patient Active Problem List   Diagnosis Date Noted  . Premature ejaculation 03/21/2018  . Tobacco abuse 05/03/2017  . Depression with anxiety 05/09/2013  . Obesity, unspecified 04/07/2013  . Insomnia 04/07/2013  . Hypothyroidism 04/07/2013  . Essential hypertension 04/04/2013  . Grief reaction 04/04/2013    Social History   Tobacco Use  . Smoking status: Current Every Day Smoker    Packs/day: 0.50    Years: 25.00    Pack years: 12.50    Types: Cigarettes  . Smokeless tobacco: Never Used  . Tobacco comment: quit in Nov 2014 but started back 03-18-13  Substance Use Topics  . Alcohol use: Yes    Comment: occasionally    Current Outpatient Medications:  .  diclofenac sodium (VOLTAREN) 1 % GEL, Apply 4 g topically 4 (four) times daily., Disp: 100 g, Rfl: 2 .  hydrochlorothiazide (HYDRODIURIL) 25 MG tablet, Take 1 tablet (25 mg total) by mouth daily., Disp: 90  tablet, Rfl: 1 .  levothyroxine (SYNTHROID, LEVOTHROID) 50 MCG tablet, TAKE 1 TABLET (50 MCG TOTAL) BY MOUTH DAILY., Disp: 30 tablet, Rfl: 3 .  NONFORMULARY OR COMPOUNDED ITEM, [facility]:  Scar Cream #14 - Verapamil 10%, Pentoxifylline 5%, apply to 1-2 grams to affected area 3-4 times daily. (Patient taking differently: [facility]:  Scar Cream #14 - Verapamil 10%, Pentoxifylline 5%, apply to 1-2 grams to affected area 3-4 times daily. Sent in 06-12-18), Disp: 100 each, Rfl: 3 .  NONFORMULARY OR COMPOUNDED ITEM, [facility]:  Scar Cream #14 - Verapamil 10%, Pentoxifylline 5%, apply 1-2 grams to affected area 3-4 times daily., Disp: 100 each, Rfl: 3 .  PARoxetine (PAXIL) 10 MG tablet, Take 1 tablet (10 mg total) by mouth daily., Disp: 30 tablet, Rfl: 1 .  triamcinolone cream (KENALOG) 0.1 %, Apply 1 application topically 2 (two) times daily., Disp: 30 g, Rfl: 2  No Known Allergies  Objective:   There were no vitals taken for this visit. AAOx3, NAD NCAT, EOMI No obvious CN deficits Coloring WNL Pt is able to speak clearly, coherently without shortness of breath or increased work of breathing.  Thought process is linear.  Mood is appropriate.   Assessment and Plan:   Fever- pt had subjective fever Thursday and documented fever yesterday.  Has been asymptomatic the entire time.  Afebrile since yesterday.  Encouraged pt to monitor temp at home and log any  symptoms.  Pt expressed understanding and is in agreement w/ plan.   Constipation- new.  Mild.  Pt has not had BM Thursday, Friday, or yet today.  Did take dose of Miralax this AM.  No results yet.  Encouraged increased water intake, fiber in diet, regular exercise.  Discussed BID dosing of Miralax until BM.  Pt to contact PCP if no improvement.   Annye Asa, MD 06/23/2018

## 2018-06-25 ENCOUNTER — Ambulatory Visit (INDEPENDENT_AMBULATORY_CARE_PROVIDER_SITE_OTHER): Payer: BLUE CROSS/BLUE SHIELD | Admitting: Family Medicine

## 2018-06-25 ENCOUNTER — Encounter: Payer: Self-pay | Admitting: Family Medicine

## 2018-06-25 ENCOUNTER — Other Ambulatory Visit: Payer: Self-pay

## 2018-06-25 DIAGNOSIS — A084 Viral intestinal infection, unspecified: Secondary | ICD-10-CM | POA: Diagnosis not present

## 2018-06-25 MED ORDER — ONDANSETRON HCL 4 MG PO TABS: 4.0000 mg | ORAL_TABLET | Freq: Three times a day (TID) | ORAL | 0 refills | Status: DC | PRN

## 2018-06-25 NOTE — Progress Notes (Signed)
Chief Complaint  Patient presents with  . Follow-up    fatigue     Subjective Tony Walters is a 43 y.o. male who presents with nausea and diarrhea. Due to COVID-19 pandemic, we are interacting via web portal for an electronic face-to-face visit. I verified patient's ID using 2 identifiers. Patient agreed to proceed with visit via this method. Patient is at [place], I am at office. Patient and I are present for visit.   Symptoms began 5 d Patient has epigastric abdominal pain, diarrhea and fever (Tmax 100 F yesterday) Patient denies cramping, headache, arthralgias, myalgias and URI symptoms Evaluation to date: [place] contacts: none known  Past Medical History:  Diagnosis Date  . Chicken pox as a child  . Elevated BP 04/04/2013  . Grief reaction 04/04/2013  . HTN (hypertension) 04/04/2013  . PTSD (post-traumatic stress disorder)   . Unspecified hypothyroidism 04/07/2013   Past Surgical History:  Procedure Laterality Date  . HYDROCELE EXCISION / REPAIR  43 years old  . WISDOM TOOTH EXTRACTION  2010   No Known Allergies  Review of Systems Constitutional:  No fevers or chills Ear/Nose/Mouth/Throat:  No red eyes Gastrointestinal:  As noted in the HPI Musculoskeletal/Extremities: no myalgias Integumentary (Skin/Breast): no rash  Exam No conversational dyspnea Age appropriate judgment and insight Nml affect and mood  Assessment and Plan  Viral gastroenteritis - Plan: ondansetron (ZOFRAN) 4 MG tablet, Stool Culture, Ova and parasite examination  Orders as above. Seems unlike to be COVID-19, which pt agrees with. Avoid aggravating foods, discussed BRAT diet. Stool sample ordered for Fri pickup if no improvement.  F/u if symptoms fail to improve, sooner if worsening. The patient voiced understanding and agreement to the plan.  [place], DO 06/25/18  10:24 AM

## 2018-06-29 ENCOUNTER — Ambulatory Visit: Payer: BC Managed Care – PPO | Admitting: Physical Therapy

## 2018-07-04 ENCOUNTER — Other Ambulatory Visit: Payer: Self-pay

## 2018-07-04 ENCOUNTER — Ambulatory Visit: Payer: BC Managed Care – PPO | Attending: Podiatry

## 2018-07-04 DIAGNOSIS — R262 Difficulty in walking, not elsewhere classified: Secondary | ICD-10-CM | POA: Diagnosis not present

## 2018-07-04 DIAGNOSIS — M79671 Pain in right foot: Secondary | ICD-10-CM

## 2018-07-04 DIAGNOSIS — M25674 Stiffness of right foot, not elsewhere classified: Secondary | ICD-10-CM | POA: Diagnosis not present

## 2018-07-04 DIAGNOSIS — M6281 Muscle weakness (generalized): Secondary | ICD-10-CM | POA: Diagnosis not present

## 2018-07-04 NOTE — Therapy (Signed)
[facility] [address]  [place], [place], [address] Phone: [phone]   Fax:  [phone]  Physical Therapy Treatment  Patient Details  Name: Tony Walters MRN: [id number] Date of Birth: [birth year] Referring Provider (PT): Tyson Dense, MD   Encounter Date: 07/04/2018  PT End of Session - 07/04/18 1006    Visit Number  21    Number of Visits  25    Date for PT Re-Evaluation  07/06/18    Authorization Type  BC[employer]    PT Start Time  1003    PT Stop Time  1050    PT Time Calculation (min)  47 min    Activity Tolerance  Patient tolerated treatment well    Behavior During Therapy  [facility] for tasks assessed/performed       Past Medical History:  Diagnosis Date  . Chicken pox as a child  . Elevated BP 04/04/2013  . Grief reaction 04/04/2013  . HTN (hypertension) 04/04/2013  . PTSD (post-traumatic stress disorder)   . Unspecified hypothyroidism 04/07/2013    Past Surgical History:  Procedure Laterality Date  . HYDROCELE EXCISION / REPAIR  43 years old  . WISDOM TOOTH EXTRACTION  2010    There were no vitals filed for this visit.  Subjective Assessment - 07/04/18 1005    Subjective  Pt. reporting he has lost close to 20# since being sick with stomach bug a few weeks ago.  Has felt back to normal over this past week.      Pertinent History  hypothyroidism, PTSD, HTN, elevated BP    Patient Stated Goals  work on strengthening     Currently in Pain?  Yes    Pain Score  1     Pain Location  --   R toe    Pain Orientation  Right    Pain Descriptors / Indicators  Aching    Pain Type  Chronic pain    Pain Radiating Towards  numbness/discomfort into R anterior shin     Pain Onset  More than a month ago    Pain Frequency  Intermittent    Multiple Pain Sites  No                       [facility] Adult PT Treatment/Exercise - 07/04/18 0001      Ambulation/Gait   Stairs  Yes    Stairs Assistance  7:  Independent    Stair Management Technique  No rails;Alternating pattern   Complaint of 6/10 pain at worst    Number of Stairs  14    Height of Stairs  8      Lumbar Exercises: Aerobic   Recumbent Bike  Lvl 4, 7 min       Manual Therapy   Manual Therapy  Joint mobilization;Passive ROM;Soft tissue mobilization    Manual therapy comments  supine     Joint Mobilization  R 1st MTP flexion, extension mobilizations grade III to tolerance    Soft tissue mobilization  DTM to R plantar scar     Passive ROM  R 1st MTP flexion and extension PROM with prolonged holds to tolerance with gentle distraction       Ankle Exercises: Stretches   Plantar Fascia Stretch  2 reps;30 seconds    Plantar Fascia Stretch Limitations  + R great toe stretch leaning into wall     Gastroc Stretch  30 seconds;2 reps  Ankle Exercises: Standing   SLS  R SLS on foam x 30 sec; mild instability however no LOB     Heel Raises  Right;10 reps;3 seconds   reduced pain levels as compared to previous visits    Heel Raises Limitations  2 sets at counter    Heel Walk (Round Trip)  2 x 90 ft     Toe Walk (Round Trip)  2 x 90 ft                PT Short Term Goals - 06/12/18 0909      PT SHORT TERM GOAL #1   Title  Patient to be independent with inital HEP.    Time  3    Period  Weeks    Status  Achieved    Target Date  04/23/18        PT Long Term Goals - 07/04/18 1037      PT LONG TERM GOAL #1   Title  Patient to be independent with advanced HEP.    Time  6    Period  Weeks    Status  Partially Met   met for current     PT LONG TERM GOAL #2   Title  Patient to report tolerance of 1.5 hours of walking without c/o pain.    Time  6    Period  Weeks    Status  Achieved   05/15/18: "yeah I can do an hour and a half".     PT LONG TERM GOAL #3   Title  Patient to demonstrate [facility] R 1st MTP and ankle AROM to allow for efficient gait pattern.    Time  6    Period  Weeks    Status  Partially Met    improvements demonstrated in R ankle dorsiflexion and inversion and R great toe flexion and extension     PT LONG TERM GOAL #4   Title  Patient to demonstrate >=4+/5 R LE strength.    Time  6    Period  Weeks    Status  Partially Met   showing improvements in R hip flexion, L knee flexion and extension, and B ankle plantarflexion strength, with R plantarflexion strength most limiting      PT LONG TERM GOAL #5   Title  Patient to demonstrate reciprocal step-through pattern with use of 1 handrail as needed to climb up/down 1 flight of stairs to allow safe access to home.    Time  6    Period  Weeks    Status  Partially Met   good form with ascending and descending steps, but reporting up to 7/10 pain in R great toe     PT LONG TERM GOAL #6   Title  Patient to report tolerance of wearing work shoe on R foot for 8 hours with <=2/10 pain.     Time  6    Period  Weeks    Status  Partially Met   tolerated 4 hours with up to 6-7/10 pain     PT LONG TERM GOAL #7   Title  Patient to tolerate SLS on foam on R LE for 30 sec without LOB.    Time  6    Period  Weeks    Status  Achieved   07/04/18: achieved 30 sec R SLS without LOB for 30 sec            Plan - 07/04/18 1008  Clinical Impression Statement  Pt. returning to therapy after nearly two weeks absence due to stomach bug.  Notes he has not had symptoms in nearly a week.  Pt. able to demo 30 sec R SLS balance without LOB on foam achieving LTG #7.  Still with complaint of 6/10 R great toe pain descending stairs.  Has not attempted ambulating in work shoes recently however encouraged to attempt wearing work shoes for 8 hours to monitor tolerance and pain.  Pt. verbalized understanding.  Pt. progressing toward goals and with MD f/u upcoming on 6.18.20.      Personal Factors and Comorbidities  Past/Current Experience;Comorbidity 3+    Comorbidities  hypothyroidism, PTSD, HTN, elevated BP    Examination-Activity Limitations   Bathing;Sit;Sleep;Carry;Squat;Stairs;Stand;Lift;Transfers;Locomotion Level    Examination-Participation Restrictions  Church;Cleaning;Community Activity;Shop;Interpersonal Relationship;Yard Work;Laundry;Meal Prep    PT Treatment/Interventions  ADLs/Self Care Home Management;Cryotherapy;Electrical Stimulation;Functional mobility training;Stair training;Gait training;Ultrasound;Moist Heat;Therapeutic activities;Therapeutic exercise;Balance training;Neuromuscular re-education;Patient/family education;Orthotic Fit/Training;Passive range of motion;Scar mobilization;Manual techniques;Dry needling;Energy conservation;Splinting;Taping;Vasopneumatic Device    PT Next Visit Plan  single leg heel raise; progress LE strengthening and great toe ROM    Consulted and Agree with Plan of Care  Patient       Patient will benefit from skilled therapeutic intervention in order to improve the following deficits and impairments:  Hypomobility, Impaired sensation, Increased edema, Decreased scar mobility, Decreased activity tolerance, Decreased strength, Pain, Increased fascial restricitons, Decreased balance, Difficulty walking, Improper body mechanics, Decreased range of motion, Postural dysfunction  Visit Diagnosis: Pain in right foot  Stiffness of right foot, not elsewhere classified  Muscle weakness (generalized)  Difficulty in walking, not elsewhere classified     Problem List Patient Active Problem List   Diagnosis Date Noted  . Premature ejaculation 03/21/2018  . Tobacco abuse 05/03/2017  . Depression with anxiety 05/09/2013  . Obesity, unspecified 04/07/2013  . Insomnia 04/07/2013  . Hypothyroidism 04/07/2013  . Essential hypertension 04/04/2013  . Grief reaction 04/04/2013    Bess Harvest, PTA 07/04/18 11:01 AM    [facility] [address]  [place], [place], [address] Phone: [phone]   Fax:  [phone]  Name: Tony Walters MRN: [id number] Date of Birth: [birth year]

## 2018-07-10 ENCOUNTER — Ambulatory Visit: Payer: BC Managed Care – PPO | Admitting: Physical Therapy

## 2018-07-10 ENCOUNTER — Other Ambulatory Visit: Payer: Self-pay

## 2018-07-10 ENCOUNTER — Encounter: Payer: Self-pay | Admitting: Physical Therapy

## 2018-07-10 DIAGNOSIS — M79671 Pain in right foot: Secondary | ICD-10-CM | POA: Diagnosis not present

## 2018-07-10 DIAGNOSIS — R262 Difficulty in walking, not elsewhere classified: Secondary | ICD-10-CM

## 2018-07-10 DIAGNOSIS — M6281 Muscle weakness (generalized): Secondary | ICD-10-CM | POA: Diagnosis not present

## 2018-07-10 DIAGNOSIS — M25674 Stiffness of right foot, not elsewhere classified: Secondary | ICD-10-CM | POA: Diagnosis not present

## 2018-07-10 NOTE — Therapy (Signed)
[facility] [address]  [place], [place], [address] Phone: [phone]   Fax:  [phone]  Physical Therapy Treatment  Patient Details  Name: Tony Walters MRN: [id number] Date of Birth: 01-Jun-1975 Referring Provider (PT): Tyson Dense, MD   Encounter Date: 07/10/2018  PT End of Session - 07/10/18 1148    Visit Number  22    Number of Visits  25    Date for PT Re-Evaluation  07/06/18    Authorization Type  [employer]    PT Start Time  1103    PT Stop Time  1152    PT Time Calculation (min)  49 min    Activity Tolerance  Patient tolerated treatment well;Patient limited by pain    Behavior During Therapy  [facility] for tasks assessed/performed       Past Medical History:  Diagnosis Date  . Chicken pox as a child  . Elevated BP 04/04/2013  . Grief reaction 04/04/2013  . HTN (hypertension) 04/04/2013  . PTSD (post-traumatic stress disorder)   . Unspecified hypothyroidism 04/07/2013    Past Surgical History:  Procedure Laterality Date  . HYDROCELE EXCISION / REPAIR  43 years old  . WISDOM TOOTH EXTRACTION  2010    There were no vitals filed for this visit.  Subjective Assessment - 07/10/18 1106    Subjective  Reports he is feeling better after getting sick. Patient reports that he is still struggling with getting past a certain number of reps of heel raises d/t pain. Reports that he is limited to 8-10 before he cannot continue. Also still having pain with toe-off when walking.    Pertinent History  hypothyroidism, PTSD, HTN, elevated BP    Patient Stated Goals  work on strengthening     Currently in Pain?  Yes    Pain Score  3     Pain Location  Toe (Comment which one)    Pain Orientation  Right    Pain Descriptors / Indicators  Dull    Pain Type  Chronic pain         [facility] PT Assessment - 07/10/18 0001      AROM   Right Ankle Dorsiflexion  11   great toe MTP extension 35 degrees with pain   Right Ankle  Plantar Flexion  50   great toe flexion 40 degrees with pain   Right Ankle Inversion  35    Right Ankle Eversion  22      Strength   Right Hip Flexion  5/5    Right Hip ABduction  4+/5    Right Hip ADduction  4+/5    Left Hip Flexion  5/5    Left Hip ABduction  4+/5    Left Hip ADduction  4+/5    Right Knee Flexion  4+/5    Right Knee Extension  4+/5    Left Knee Flexion  5/5    Left Knee Extension  4+/5    Right Ankle Dorsiflexion  4+/5    Right Ankle Plantar Flexion  4+/5   13 reps with limited ROM and pain   Right Ankle Inversion  4+/5    Right Ankle Eversion  4+/5    Left Ankle Dorsiflexion  4+/5    Left Ankle Plantar Flexion  5/5   carried over from last session   Left Ankle Inversion  4+/5    Left Ankle Eversion  4+/5  [facility] Adult PT Treatment/Exercise - 07/10/18 0001      Ambulation/Gait   Stairs  Yes    Stairs Assistance  7: Independent    Stair Management Technique  No rails;Alternating pattern    Number of Stairs  14    Height of Stairs  8    Gait Comments  4/10 pain in R great toe      Lumbar Exercises: Aerobic   Recumbent Bike  Lvl 4, 6 min       Knee/Hip Exercises: Stretches   Contractor reps;30 seconds    Gastroc Stretch Limitations  at wall      Knee/Hip Exercises: Standing   Functional Squat  1 set;10 reps    Functional Squat Limitations  heel raise + sissy squat w/ 2 ski poles     SLS  R SLS + dumbell pass x10   intermittent LOB with self correction     Vasopneumatic   Number Minutes Vasopneumatic   10 minutes    Vasopnuematic Location   Ankle   R   Vasopneumatic Pressure  Medium    Vasopneumatic Temperature   Coldest temp.               PT Education - 07/10/18 1148    Education Details  discussion on objective progress with PT and remaining limitations    Person(s) Educated  Patient    Methods  Explanation    Comprehension  Verbalized understanding       PT Short Term Goals -  07/10/18 1111      PT SHORT TERM GOAL #1   Title  Patient to be independent with inital HEP.    Time  3    Period  Weeks    Status  Achieved    Target Date  04/23/18        PT Long Term Goals - 07/10/18 1112      PT LONG TERM GOAL #1   Title  Patient to be independent with advanced HEP.    Time  6    Period  Weeks    Status  Partially Met   met for current     PT LONG TERM GOAL #2   Title  Patient to report tolerance of 1.5 hours of walking without c/o pain.    Time  6    Period  Weeks    Status  Achieved   05/15/18: "yeah I can do an hour and a half".     PT LONG TERM GOAL #3   Title  Patient to demonstrate [facility] R 1st MTP and ankle AROM to allow for efficient gait pattern.    Time  6    Period  Weeks    Status  Partially Met   improvements demonstrated in R ankle dorsiflexion and inversion and R great toe flexion and extension     PT LONG TERM GOAL #4   Title  Patient to demonstrate >=4+/5 R LE strength.    Time  6    Period  Weeks    Status  Partially Met   showing improvements in R hip flexion, L knee flexion and extension, and B ankle plantarflexion strength, with R plantarflexion strength most limiting      PT LONG TERM GOAL #5   Title  Patient to demonstrate reciprocal step-through pattern with use of 1 handrail as needed to climb up/down 1 flight of stairs to allow safe access to home.    Time  6  Period  Weeks    Status  Partially Met   good form with ascending and descending steps, but reporting up to 4/10 pain in R great toe     PT LONG TERM GOAL #6   Title  Patient to report tolerance of wearing work shoe on R foot for 8 hours with <=2/10 pain.     Time  6    Period  Weeks    Status  Partially Met   tolerated 6 hours with up to 7/10 pain     PT LONG TERM GOAL #7   Title  Patient to tolerate SLS on foam on R LE for 30 sec without LOB.    Time  6    Period  Weeks    Status  Achieved   07/04/18: achieved 30 sec R SLS without LOB for 30 sec             Plan - 07/10/18 1230    Clinical Impression Statement  Patient arrived to session with report that he is still struggling with getting past a certain number of reps of heel raises d/t pain. Reports that he is limited to 8-10 before he has to stop. Patient today demonstrating decreased R great toe AROM. However, able to perform increased reps of single leg heel raises of R LE- 13 reps today compared to 10 reps when last measured, indication improvement in calf strength. Patient now reporting tolerance of 6 hours on his feet while wearing work shoes, but notes that his job duties require him to stand on his toes to reach overhead and he feels he is not ready to perform this without pain. Worked on gastroc stretching and great toe extension ther-ex during today's session- patient continuing to report pain at end ranges of motion. Ended session with [place] to R foot for pain relief. No further complaints at end of session. Patient showing slow but consistent progress with strength, however still limited by ROM and pain at this time.    Comorbidities  hypothyroidism, PTSD, HTN, elevated BP    PT Treatment/Interventions  ADLs/Self Care Home Management;Cryotherapy;Electrical Stimulation;Functional mobility training;Stair training;Gait training;Ultrasound;Moist Heat;Therapeutic activities;Therapeutic exercise;Balance training;Neuromuscular re-education;Patient/family education;Orthotic Fit/Training;Passive range of motion;Scar mobilization;Manual techniques;Dry needling;Energy conservation;Splinting;Taping;Vasopneumatic Device    PT Next Visit Plan  single leg heel raise; progress LE strengthening and great toe ROM    Consulted and Agree with Plan of Care  Patient       Patient will benefit from skilled therapeutic intervention in order to improve the following deficits and impairments:  Hypomobility, Impaired sensation, Increased edema, Decreased scar mobility, Decreased activity tolerance,  Decreased strength, Pain, Increased fascial restricitons, Decreased balance, Difficulty walking, Improper body mechanics, Decreased range of motion, Postural dysfunction  Visit Diagnosis: 1. Pain in right foot   2. Stiffness of right foot, not elsewhere classified   3. Muscle weakness (generalized)   4. Difficulty in walking, not elsewhere classified        Problem List Patient Active Problem List   Diagnosis Date Noted  . Premature ejaculation 03/21/2018  . Tobacco abuse 05/03/2017  . Depression with anxiety 05/09/2013  . Obesity, unspecified 04/07/2013  . Insomnia 04/07/2013  . Hypothyroidism 04/07/2013  . Essential hypertension 04/04/2013  . Grief reaction 04/04/2013    Janene Harvey, PT, DPT 07/10/18 12:33 PM    [facility] [address]  [place], [place], [address] Phone: [phone]   Fax:  [phone]  Name: Keyshon Stein  MRN: [id number] Date of Birth: 01-Aug-1975

## 2018-07-12 ENCOUNTER — Ambulatory Visit: Payer: BC Managed Care – PPO | Admitting: Physical Therapy

## 2018-07-12 ENCOUNTER — Encounter: Payer: Self-pay | Admitting: Physical Therapy

## 2018-07-12 ENCOUNTER — Encounter: Payer: Self-pay | Admitting: Podiatry

## 2018-07-12 ENCOUNTER — Other Ambulatory Visit: Payer: Self-pay

## 2018-07-12 ENCOUNTER — Ambulatory Visit (INDEPENDENT_AMBULATORY_CARE_PROVIDER_SITE_OTHER): Payer: BC Managed Care – PPO | Admitting: Podiatry

## 2018-07-12 VITALS — Temp 98.0°F

## 2018-07-12 DIAGNOSIS — Z9889 Other specified postprocedural states: Secondary | ICD-10-CM

## 2018-07-12 DIAGNOSIS — M722 Plantar fascial fibromatosis: Secondary | ICD-10-CM

## 2018-07-12 DIAGNOSIS — M6281 Muscle weakness (generalized): Secondary | ICD-10-CM | POA: Diagnosis not present

## 2018-07-12 DIAGNOSIS — M25674 Stiffness of right foot, not elsewhere classified: Secondary | ICD-10-CM

## 2018-07-12 DIAGNOSIS — R262 Difficulty in walking, not elsewhere classified: Secondary | ICD-10-CM | POA: Diagnosis not present

## 2018-07-12 DIAGNOSIS — M79671 Pain in right foot: Secondary | ICD-10-CM | POA: Diagnosis not present

## 2018-07-12 DIAGNOSIS — M205X1 Other deformities of toe(s) (acquired), right foot: Secondary | ICD-10-CM | POA: Diagnosis not present

## 2018-07-12 NOTE — Progress Notes (Signed)
He presents today date of surgery 01/12/2018 status post Tony Walters bunionectomy with implant and excision plantar fibroma states that it hurts a lot after PT workout and it really hurts when I try to go up on my toes I do not have the strength for it yet.  He denies fever chills nausea vomiting muscle aches and pains that he was too sick 2 weeks ago.  Objective: Vital signs are stable he is alert and oriented x3.  Pulses are palpable.  Neurologic sensorium is intact deep tendon reflexes are intact muscle strength is normal.  He has good range of motion of the first metatarsal phalangeal joint of the right foot that he did just come from physical therapy.  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  Though it is still painful I feel that physical therapy is going to be the best route for him to get back up to speed prior to going back to work.  I would like to follow-up with him in 1 month.

## 2018-07-12 NOTE — Therapy (Addendum)
[facility] [address]  [place], [place], [address] Phone: [phone]   Fax:  [phone]  Physical Therapy Treatment  Patient Details  Name: Tony Walters MRN: [id number] Date of Birth: May 08, 1975 Referring Provider (PT): Tyson Dense, MD   Encounter Date: 07/12/2018  PT End of Session - 07/12/18 0947    Visit Number  23    Number of Visits  25    Date for PT Re-Evaluation  07/06/18    Authorization Type  BC[employer]    PT Start Time  0906    PT Stop Time  0944    PT Time Calculation (min)  38 min    Activity Tolerance  Patient tolerated treatment well;Patient limited by pain    Behavior During Therapy  [facility] for tasks assessed/performed       Past Medical History:  Diagnosis Date  . Chicken pox as a child  . Elevated BP 04/04/2013  . Grief reaction 04/04/2013  . HTN (hypertension) 04/04/2013  . PTSD (post-traumatic stress disorder)   . Unspecified hypothyroidism 04/07/2013    Past Surgical History:  Procedure Laterality Date  . HYDROCELE EXCISION / REPAIR  43 years old  . WISDOM TOOTH EXTRACTION  2010    There were no vitals filed for this visit.  Subjective Assessment - 07/12/18 0908    Subjective  Patient reports that he is in some pain today- unsure why. Has appt with his surgeon today.    Pertinent History  hypothyroidism, PTSD, HTN, elevated BP    Patient Stated Goals  work on strengthening     Currently in Pain?  Yes    Pain Score  2     Pain Location  Toe (Comment which one)    Pain Orientation  Right    Pain Descriptors / Indicators  Aching    Pain Type  Chronic pain         [facility] PT Assessment - 07/12/18 0001      Observation/Other Assessments   Focus on Therapeutic Outcomes (FOTO)   62% (38% limitation, 51% predicted)       AROM   Right Ankle Dorsiflexion  11    Right Ankle Plantar Flexion  50    Right Ankle Inversion  35    Right Ankle Eversion  22      Strength   Right Hip  Flexion  5/5    Right Hip ABduction  4+/5    Right Hip ADduction  4+/5    Left Hip Flexion  5/5    Left Hip ABduction  4+/5    Left Hip ADduction  4+/5    Right Knee Flexion  4+/5    Right Knee Extension  4+/5    Left Knee Flexion  5/5    Left Knee Extension  4+/5    Right Ankle Dorsiflexion  4+/5    Right Ankle Plantar Flexion  4+/5    Right Ankle Inversion  4+/5    Right Ankle Eversion  4+/5    Left Ankle Dorsiflexion  4+/5    Left Ankle Plantar Flexion  5/5    Left Ankle Inversion  4+/5    Left Ankle Eversion  4+/5                   [facility] Adult PT Treatment/Exercise - 07/12/18 0001      Lumbar Exercises: Aerobic   Nustep  L5 x 6 min      Knee/Hip  Exercises: Standing   Functional Squat  1 set;10 reps    Functional Squat Limitations  heel raise + sissy squat at counter top    Other Standing Knee Exercises  toe walking 35f      Ankle Exercises: Stretches   Plantar Fascia Stretch  2 reps;30 seconds   at counter top   Soleus Stretch  30 seconds;1 rep    Soleus Stretch Limitations  wall     Gastroc Stretch  30 seconds;1 rep    Gastroc Stretch Limitations  wall       Ankle Exercises: Standing   Heel Raises  Right;10 reps    Heel Raises Limitations  2 sets at counter    Other Standing Ankle Exercises  split squats at counter top x10 at each LE   cues for upright trunk            PT Education - 07/12/18 0946    Education Details  update and consolidation of HEP; discussion on objective progress with PT and remaining limitations    Person(s) Educated  Patient    Methods  Explanation;Demonstration;Tactile cues;Verbal cues;Handout    Comprehension  Verbalized understanding;Returned demonstration       PT Short Term Goals - 07/12/18 0953      PT SHORT TERM GOAL #1   Title  Patient to be independent with inital HEP.    Time  3    Period  Weeks    Status  Achieved    Target Date  04/23/18        PT Long Term Goals - 07/12/18 0953      PT LONG  TERM GOAL #1   Title  Patient to be independent with advanced HEP.    Time  6    Period  Weeks    Status  Achieved      PT LONG TERM GOAL #2   Title  Patient to report tolerance of 1.5 hours of walking without c/o pain.    Time  6    Period  Weeks    Status  Achieved   05/15/18: "yeah I can do an hour and a half".     PT LONG TERM GOAL #3   Title  [facility] to [facility] and ankle AROM to allow for efficient gait pattern.    Time  6    Period  Weeks    Status  Partially Met   improvements demonstrated in R ankle dorsiflexion and inversion and R great toe flexion and extension     PT LONG TERM GOAL #4   Title  Patient to demonstrate >=4+/5 R LE strength.    Time  6    Period  Weeks    Status  Partially Met   showing improvements in R hip flexion, L knee flexion and extension, and B ankle plantarflexion strength, with R plantarflexion strength most limiting      PT LONG TERM GOAL #5   Title  Patient to demonstrate reciprocal step-through pattern with use of 1 handrail as needed to climb up/down 1 flight of stairs to allow safe access to home.    Time  6    Period  Weeks    Status  Partially Met   good form with ascending and descending steps, but reporting up to 4/10 pain in R great toe     PT LONG TERM GOAL #6   Title  Patient to report tolerance of wearing work shoe on R foot  for 8 hours with <=2/10 pain.     Time  6    Period  Weeks    Status  Partially Met   tolerated 6 hours with up to 7/10 pain     PT LONG TERM GOAL #7   Title  Patient to tolerate SLS on foam on R LE for 30 sec without LOB.    Time  6    Period  Weeks    Status  Achieved   07/04/18: achieved 30 sec R SLS without LOB for 30 sec            Plan - 07/12/18 0948    Clinical Impression Statement  Patient arrived to appointment with report of increased soreness without known cause. Reports that he feels he still has pain with heel raises and toe-off with walking. Worked on reviewing  and updating HEP for improved compliance and consistency.  Patient still with some pain with heel raises and other ther-ex requiring toe extension. Required consistent cues for upright trunk and maintaining proper knee alignment. Patient reporting that he requires standing on toes and kneeling to return to his job, and does not feel ready to return at this time. However, according to measurements taken last session, patient has reached a plateau in progress d/t continued pain. Patient has received max benefit from PT at this time- placing patient on 30 day hold. Advised patient to return within 30 day window if any problems arise. Patient reported understanding and declined modalities at end of session.    Comorbidities  hypothyroidism, PTSD, HTN, elevated BP    PT Treatment/Interventions  ADLs/Self Care Home Management;Cryotherapy;Electrical Stimulation;Functional mobility training;Stair training;Gait training;Ultrasound;Moist Heat;Therapeutic activities;Therapeutic exercise;Balance training;Neuromuscular re-education;Patient/family education;Orthotic Fit/Training;Passive range of motion;Scar mobilization;Manual techniques;Dry needling;Energy conservation;Splinting;Taping;Vasopneumatic Device    PT Next Visit Plan  30 day hold    Consulted and Agree with Plan of Care  Patient       Patient will benefit from skilled therapeutic intervention in order to improve the following deficits and impairments:  Hypomobility, Impaired sensation, Increased edema, Decreased scar mobility, Decreased activity tolerance, Decreased strength, Pain, Increased fascial restricitons, Decreased balance, Difficulty walking, Improper body mechanics, Decreased range of motion, Postural dysfunction  Visit Diagnosis: 1. Pain in right foot   2. Stiffness of right foot, not elsewhere classified   3. Muscle weakness (generalized)   4. Difficulty in walking, not elsewhere classified        Problem List Patient Active Problem  List   Diagnosis Date Noted  . Premature ejaculation 03/21/2018  . Tobacco abuse 05/03/2017  . Depression with anxiety 05/09/2013  . Obesity, unspecified 04/07/2013  . Insomnia 04/07/2013  . Hypothyroidism 04/07/2013  . Essential hypertension 04/04/2013  . Grief reaction 04/04/2013    Janene Harvey, PT, DPT 07/12/18 9:55 AM    [facility] [address]  [place], [place], [address] Phone: [phone]   Fax:  [phone]  Name: Tony Walters MRN: [id number] Date of Birth: July 25, 1975  PHYSICAL THERAPY DISCHARGE SUMMARY  Visits from Start of Care: 23  Current functional level related to goals / functional outcomes: See above clinical impression; patient did not return after 30 day hold   Remaining deficits: Decreased ROM, decreased strength, limited standing and stair climbing tolerance   Education / Equipment: HEP  Plan: Patient agrees to discharge.  Patient goals were partially met. Patient is being discharged due to lack of progress.  ?????     Janene Harvey,  PT, DPT 08/13/18 3:13 PM

## 2018-08-09 ENCOUNTER — Other Ambulatory Visit: Payer: Self-pay

## 2018-08-09 ENCOUNTER — Encounter: Payer: Self-pay | Admitting: Podiatry

## 2018-08-09 ENCOUNTER — Ambulatory Visit (INDEPENDENT_AMBULATORY_CARE_PROVIDER_SITE_OTHER): Payer: BC Managed Care – PPO | Admitting: Podiatry

## 2018-08-09 VITALS — Temp 98.0°F

## 2018-08-09 DIAGNOSIS — M205X1 Other deformities of toe(s) (acquired), right foot: Secondary | ICD-10-CM | POA: Diagnosis not present

## 2018-08-09 DIAGNOSIS — Z9889 Other specified postprocedural states: Secondary | ICD-10-CM | POA: Diagnosis not present

## 2018-08-09 DIAGNOSIS — M722 Plantar fascial fibromatosis: Secondary | ICD-10-CM

## 2018-08-11 NOTE — Progress Notes (Signed)
He presents today for follow-up of a Keller arthroplasty single silicone implant right foot and excision of plantar fibroma.  States that it does feel better but and still unable to squat and kneel which my job requires.  I cannot bring my toe back all the way and it is very painful when I try to do that.  He states that he is continuing physical therapy.  Objective: Vital signs are stable he is alert and oriented x3 much decrease in edema area no erythema no cellulitis drainage odor limited range of motion of the first metatarsophalangeal joint particularly on dorsiflexion plantarflexion is good.  Excision site of plantar fibroma is healing very well scar tissue is resolving.  Assessment: Well-healing surgical foot.  Limited dorsiflexion of the first metatarsophalangeal joint needs to be worked out.  Plan: Discussed etiology pathology conservative versus surgical therapies.  At this point he is going to continue his physical therapy and continue aggressive ambulation to help loosen the scar tissue around the first metatarsal phalangeal joint implant so that he can get adequate dorsiflexion for returning to work.  I will follow-up with him in a month to 6 weeks.

## 2018-09-06 ENCOUNTER — Ambulatory Visit (INDEPENDENT_AMBULATORY_CARE_PROVIDER_SITE_OTHER): Payer: BC Managed Care – PPO | Admitting: Podiatry

## 2018-09-06 ENCOUNTER — Encounter: Payer: Self-pay | Admitting: Podiatry

## 2018-09-06 ENCOUNTER — Other Ambulatory Visit: Payer: Self-pay

## 2018-09-06 DIAGNOSIS — Z9889 Other specified postprocedural states: Secondary | ICD-10-CM

## 2018-09-06 DIAGNOSIS — M722 Plantar fascial fibromatosis: Secondary | ICD-10-CM | POA: Diagnosis not present

## 2018-09-06 DIAGNOSIS — M205X1 Other deformities of toe(s) (acquired), right foot: Secondary | ICD-10-CM

## 2018-09-06 NOTE — Progress Notes (Signed)
He presents today date of surgery 01/12/2018 status post Tony Walters bunion repair right with excision of plantar fibroma on the right foot.  He states that still can get my toe actively.  He states that I cannot go up on my toes and another cannot do my job on her regular basis are released to regular job that I do.  He states that I cannot squat and I cannot go up on her toes and I have to do this at work.  Objective: Vital signs are stable he is alert oriented x3.  Pulses are palpable.  He has great range of motion of the first metatarsophalangeal joint at this point with minimal edema no erythema cellulitis drainage or odor.  Appears to be healing very nicely that he is unable to do toe raise on physical exam.  Assessment: Well-healing surgical foot right no toe raises right.  Plan: I encouraged him to continue strengthening his posterior muscles and calf muscles until he can get a full toe raise and full squat.  He understands this is minimal to would only given 2 months to do so follow-up with him after that.

## 2018-10-24 ENCOUNTER — Other Ambulatory Visit: Payer: Self-pay | Admitting: Family Medicine

## 2018-10-24 DIAGNOSIS — F524 Premature ejaculation: Secondary | ICD-10-CM

## 2018-10-24 MED FILL — PARoxetine HCL 10 MG TABS: 10 | 30 days supply | Qty: 30 | Fill #0

## 2018-10-24 MED FILL — LEVOTHYROXINE 50 MCG TABLET: 50 | 30 days supply | Qty: 30 | Fill #1

## 2018-10-24 MED FILL — HYDROCHLOROTHIAZIDE 25 MG T: 25 | 30 days supply | Qty: 30 | Fill #1

## 2018-11-06 ENCOUNTER — Encounter: Payer: Self-pay | Admitting: Podiatry

## 2018-11-06 ENCOUNTER — Ambulatory Visit (INDEPENDENT_AMBULATORY_CARE_PROVIDER_SITE_OTHER): Payer: BC Managed Care – PPO | Admitting: Podiatry

## 2018-11-06 ENCOUNTER — Other Ambulatory Visit: Payer: Self-pay

## 2018-11-06 DIAGNOSIS — M722 Plantar fascial fibromatosis: Secondary | ICD-10-CM

## 2018-11-06 DIAGNOSIS — M205X1 Other deformities of toe(s) (acquired), right foot: Secondary | ICD-10-CM

## 2018-11-06 DIAGNOSIS — Z9889 Other specified postprocedural states: Secondary | ICD-10-CM

## 2018-11-06 NOTE — Progress Notes (Signed)
He presents today for follow-up of his Tony Walters arthroplasty date of surgery 01/12/2018 states that is better but is still sore and is still very weak.  He states that his been doing his toe raises and is even trying to play a little basketball with his daughter.  Continues his physical therapy.  Objective: Vital signs are stable he is alert and oriented x3.  Pulses are palpable.  He has much better range of motion than previously noted.  However he is still weak when he tries to go up on his toes.  Radiographs taken today demonstrate well-healing Keller arthroplasty with single silicone implant.  Assessment: Well-healing surgical foot.  Date of surgery 01/12/2018.  Plan: Continue physical therapy follow-up with me in 2 weeks

## 2018-11-08 ENCOUNTER — Ambulatory Visit: Payer: BC Managed Care – PPO | Admitting: Podiatry

## 2018-11-16 ENCOUNTER — Ambulatory Visit: Payer: BC Managed Care – PPO | Admitting: Family Medicine

## 2018-11-16 ENCOUNTER — Encounter: Payer: Self-pay | Admitting: Family Medicine

## 2018-11-16 ENCOUNTER — Other Ambulatory Visit: Payer: Self-pay

## 2018-11-16 VITALS — BP 152/80 | HR 82 | Temp 98.2°F | Ht 74.0 in | Wt 268.1 lb

## 2018-11-16 DIAGNOSIS — E039 Hypothyroidism, unspecified: Secondary | ICD-10-CM | POA: Diagnosis not present

## 2018-11-16 DIAGNOSIS — F524 Premature ejaculation: Secondary | ICD-10-CM

## 2018-11-16 DIAGNOSIS — I10 Essential (primary) hypertension: Secondary | ICD-10-CM

## 2018-11-16 MED ORDER — PAROXETINE HCL 20 MG PO TABS: 20.0000 mg | ORAL_TABLET | Freq: Every day | ORAL | 1 refills | Status: DC

## 2018-11-16 MED ORDER — LEVOTHYROXINE SODIUM 50 MCG PO TABS: 50.0000 ug | ORAL_TABLET | Freq: Every day | ORAL | 3 refills | Status: DC

## 2018-11-16 MED ORDER — HYDROCHLOROTHIAZIDE 25 MG PO TABS: 25.0000 mg | ORAL_TABLET | Freq: Every day | ORAL | 1 refills | Status: DC

## 2018-11-16 MED FILL — PARoxetine HCL 20 MG TABS: 20 | 30 days supply | Qty: 30 | Fill #0

## 2018-11-16 MED FILL — LEVOTHYROXINE 50 MCG TABLET: 50 | 30 days supply | Qty: 30 | Fill #1

## 2018-11-16 MED FILL — HYDROCHLOROTHIAZIDE 25 MG T: 25 | 30 days supply | Qty: 30 | Fill #1

## 2018-11-16 NOTE — Progress Notes (Signed)
CC: HTN  Subjective Tony Walters is a 43 y.o. male who presents for hypertension follow up. He does not monitor home blood pressures. He has run out of his hydrochlorothiazide Patient has these side effects of medication: none He is adhering to a healthy diet overall. Current exercise: None currently due to chronic right foot issues  Patient has a history of hypothyroidism.  His last lab draw was 8 months ago.  He has not been compliant with his Synthroid.  He never had a recheck after starting the medication.  Still having some fatigue.  History of premature ejaculation.  He was placed on 10 mg of Paxil and then ran out.  He stated it was somewhat helpful.  He did not have any adverse reactions otherwise.  He is currently having the issue still.    Past Medical History:  Diagnosis Date  . Chicken pox as a child  . Elevated BP 04/04/2013  . Grief reaction 04/04/2013  . HTN (hypertension) 04/04/2013  . PTSD (post-traumatic stress disorder)   . Unspecified hypothyroidism 04/07/2013    Review of Systems Cardiovascular: no chest pain Respiratory:  no shortness of breath  Exam BP (!) 152/80 (BP Location: Right Arm, Patient Position: Sitting, Cuff Size: Normal)   Pulse 82   Temp 98.2 F (36.8 C) (Temporal)   Ht 6\' 2"  (1.88 m)   Wt 268 lb 2 oz (121.6 kg)   SpO2 98%   BMI 34.43 kg/m  General:  well developed, well nourished, in no apparent distress Neck: Supple, symmetric, no thyromegaly or nodules noted Heart: RRR, no bruits, no LE edema Lungs: clear to auscultation, no accessory muscle use Psych: well oriented with normal range of affect and appropriate judgment/insight  Essential hypertension - Plan: hydrochlorothiazide (HYDRODIURIL) 25 MG tablet  Acquired hypothyroidism - Plan: levothyroxine (SYNTHROID) 50 MCG tablet  Premature ejaculation - Plan: PARoxetine (PAXIL) 20 MG tablet  Needs to go back on medication.  I will see him in 6 weeks and we will follow-up on the  above.  For the Paxil, I will have him take half a tab daily for the first 2 weeks. Counseled on diet and exercise. The patient voiced understanding and agreement to the plan.  [place], DO 11/16/18  5:20 PM

## 2018-11-16 NOTE — Patient Instructions (Addendum)
Keep the diet clean and stay active.  Remember to take your medicine daily. Let me know if you run out.   Let us know if you need anything.

## 2018-12-28 ENCOUNTER — Encounter: Payer: Self-pay | Admitting: Family Medicine

## 2018-12-28 ENCOUNTER — Ambulatory Visit (INDEPENDENT_AMBULATORY_CARE_PROVIDER_SITE_OTHER): Payer: Self-pay | Admitting: Family Medicine

## 2018-12-28 ENCOUNTER — Other Ambulatory Visit: Payer: Self-pay

## 2018-12-28 VITALS — BP 112/78 | HR 98 | Temp 97.9°F | Resp 18 | Wt 264.2 lb

## 2018-12-28 DIAGNOSIS — E039 Hypothyroidism, unspecified: Secondary | ICD-10-CM

## 2018-12-28 DIAGNOSIS — F524 Premature ejaculation: Secondary | ICD-10-CM

## 2018-12-28 DIAGNOSIS — I10 Essential (primary) hypertension: Secondary | ICD-10-CM

## 2018-12-28 MED ORDER — PAROXETINE HCL 30 MG PO TABS: 30.0000 mg | ORAL_TABLET | Freq: Every day | ORAL | 2 refills | Status: DC

## 2018-12-28 MED ORDER — HYDROCHLOROTHIAZIDE 25 MG PO TABS: 25.0000 mg | ORAL_TABLET | Freq: Every day | ORAL | 2 refills | Status: DC

## 2018-12-28 MED FILL — PARoxetine HCL 30 MG TABS: 30 | 30 days supply | Qty: 30 | Fill #0

## 2018-12-28 MED FILL — HYDROCHLOROTHIAZIDE 25 MG T: 25 | 90 days supply | Qty: 90 | Fill #0

## 2018-12-28 NOTE — Patient Instructions (Addendum)
Keep the diet clean and stay active.  Give [place] 2-3 business days to get the results of your labs back.   Please consider counseling. Contact [phone] to schedule an appointment or inquire about cost/insurance coverage.  Let us know if you need anything.

## 2018-12-28 NOTE — Progress Notes (Signed)
Chief Complaint  Patient presents with  . Medication follow-up  . knot on head    left side of his forehead     Subjective Tony Walters is a 43 y.o. male who presents for hypertension follow up. He does monitor home blood pressures. Does not remember results.  He is compliant with medication-hydrochlorothiazide 25 mg daily. Patient has these side effects of medication: none He is adhering to a healthy diet overall. Current exercise: none  Pt still having premature ejaculation. Paxil had worked well at lower doses, but stopped working. He is not having any other side effects.   Patient presents for follow-up of hypothyroidism.  Reports compliance with medication now, Synthroid 50 mcg/d. Current symptoms include: denies fatigue, weight changes, heat/cold intolerance, bowel/skin changes or CVS symptoms T4 was low at last check.  He believes his dose should be unchanged   Past Medical History:  Diagnosis Date  . Chicken pox as a child  . Elevated BP 04/04/2013  . Grief reaction 04/04/2013  . HTN (hypertension) 04/04/2013  . PTSD (post-traumatic stress disorder)   . Unspecified hypothyroidism 04/07/2013    Review of Systems Cardiovascular: no chest pain Respiratory:  no shortness of breath  Exam BP 112/78 (BP Location: Left Arm, Patient Position: Sitting, Cuff Size: Normal)   Pulse 98   Temp 97.9 F (36.6 C) (Temporal)   Resp 18   Wt 264 lb 3.2 oz (119.8 kg)   SpO2 98%   BMI 33.92 kg/m  General:  well developed, well nourished, in no apparent distress Heart: RRR, no bruits, no LE edema Lungs: clear to auscultation, no accessory muscle use Neck: Supple, symmetric, no nodules or masses noted Psych: well oriented with normal range of affect and appropriate judgment/insight  Essential hypertension - Plan: hydrochlorothiazide (HYDRODIURIL) 25 MG tablet  Premature ejaculation  Acquired hypothyroidism - Plan: TSH, T4, free  Orders as above. Counseled on diet and  exercise. F/u in 6 mo for CPE or prn. The patient voiced understanding and agreement to the plan.  [place], DO 12/28/18  3:58 PM

## 2018-12-29 LAB — T4, FREE: Free T4: 0.9 ng/dL (ref 0.8–1.8)

## 2018-12-29 LAB — TSH: TSH: 18.26 mIU/L — ABNORMAL HIGH (ref 0.40–4.50)

## 2019-01-08 ENCOUNTER — Ambulatory Visit (INDEPENDENT_AMBULATORY_CARE_PROVIDER_SITE_OTHER): Payer: Self-pay

## 2019-01-08 ENCOUNTER — Encounter: Payer: Self-pay | Admitting: Podiatry

## 2019-01-08 ENCOUNTER — Other Ambulatory Visit: Payer: Self-pay

## 2019-01-08 ENCOUNTER — Ambulatory Visit: Payer: Self-pay | Admitting: Podiatry

## 2019-01-08 ENCOUNTER — Other Ambulatory Visit: Payer: Self-pay | Admitting: Podiatry

## 2019-01-08 DIAGNOSIS — M109 Gout, unspecified: Secondary | ICD-10-CM

## 2019-01-08 DIAGNOSIS — M205X1 Other deformities of toe(s) (acquired), right foot: Secondary | ICD-10-CM

## 2019-01-08 DIAGNOSIS — Z9889 Other specified postprocedural states: Secondary | ICD-10-CM

## 2019-01-08 DIAGNOSIS — M722 Plantar fascial fibromatosis: Secondary | ICD-10-CM

## 2019-01-08 MED ORDER — MELOXICAM 15 MG PO TABS: 15.0000 mg | ORAL_TABLET | Freq: Every day | ORAL | 3 refills | Status: DC

## 2019-01-08 MED ORDER — METHYLPREDNISOLONE 4 MG PO TBPK: ORAL_TABLET | ORAL | 0 refills | Status: DC

## 2019-01-09 NOTE — Progress Notes (Signed)
He presents today date of surgery 01/12/2018 status post Tony Walters bunion implant right states that is killing me states that it swelled up turned red and warm it is painful.  Objective: Vital signs are stable he is alert and oriented x3.  Pulses are palpable.  He has warmth on palpation and attempted range of motion of the first metatarsophalangeal joint of the right foot.  Exquisitely tender.  Radiographs do not demonstrate any significant abnormalities or changes in the implant itself.  Assessment cannot rule out a gouty capsulitis of the first metatarsophalangeal joint.  Plan: Requested arthritic profile today and started him on methylprednisolone.  Follow-up with him in a couple weeks.

## 2019-01-23 ENCOUNTER — Telehealth: Payer: Self-pay | Admitting: Podiatry

## 2019-01-23 NOTE — Telephone Encounter (Signed)
Please advise 

## 2019-01-23 NOTE — Telephone Encounter (Signed)
I need to complete paperwork for this patient and I have a return to work date for 01/08/2019 on his last notes that were faxed over 11/14/2018.  Tony Walters needs a RTW date, he works for [employer] and he cant return to [employer] with restrictions. Please advice.

## 2019-01-23 NOTE — Telephone Encounter (Signed)
Tony Walters I do not recall when he was going to go back to work.  He never had the blood work done from his last office visit on December 15.  We could extend him out to 1 February but I think he is going to have to go back at that point.  You may want to call him and see what his plans are if he is even going to go back to work.

## 2019-02-19 ENCOUNTER — Encounter: Payer: Self-pay | Admitting: Podiatry

## 2019-02-19 ENCOUNTER — Other Ambulatory Visit: Payer: Self-pay

## 2019-02-19 ENCOUNTER — Ambulatory Visit (INDEPENDENT_AMBULATORY_CARE_PROVIDER_SITE_OTHER): Payer: Self-pay | Admitting: Podiatry

## 2019-02-19 DIAGNOSIS — M778 Other enthesopathies, not elsewhere classified: Secondary | ICD-10-CM

## 2019-02-19 MED ORDER — INDOMETHACIN 50 MG PO CAPS: 50.0000 mg | ORAL_CAPSULE | Freq: Two times a day (BID) | ORAL | 1 refills | Status: DC

## 2019-02-19 MED ORDER — METHYLPREDNISOLONE 4 MG PO TBPK: ORAL_TABLET | ORAL | 0 refills | Status: DC

## 2019-02-19 MED FILL — INDOMETHACIN 50 MG CAPSULE: 50 | 30 days supply | Qty: 60 | Fill #0

## 2019-02-19 MED FILL — METHYLPREDNISOLONE 4 MG TBP: 4 | 6 days supply | Qty: 21 | Fill #0

## 2019-02-19 NOTE — Progress Notes (Signed)
He presents today for follow-up of his capsulitis first metatarsophalangeal joint of the right foot.  States that is still really sore in the joint.  States that he is even had some pain along the plantar incision where the plantar fibroma was.  Objective: Vital signs are stable he is alert and oriented x3.  Pulses are palpable.  There is minimal edema no erythema cellulitis drainage or odor he has good range of motion of the joint the joint is not warm to the touch today but still tender on deep palpation.  He also has some tenderness on palpation to the plantar incision though it is not warm or not related at all.  Assessment: Capsulitis seems to be less than it was last time right first metatarsophalangeal joint.  Plan: At this point I recommend that he continue about the same activity level because he is doing better than he was I feel.  I still and put him on a Medrol Dosepak which he did not pick up last visit and I am also going to add in Indocin to that which she will twice take twice a day.  I will also reorder his labs.  I will follow-up with him in about 6 weeks.

## 2019-04-02 ENCOUNTER — Other Ambulatory Visit: Payer: Self-pay

## 2019-04-02 ENCOUNTER — Ambulatory Visit (INDEPENDENT_AMBULATORY_CARE_PROVIDER_SITE_OTHER): Payer: Self-pay | Admitting: Podiatry

## 2019-04-02 ENCOUNTER — Encounter: Payer: Self-pay | Admitting: Podiatry

## 2019-04-02 DIAGNOSIS — M778 Other enthesopathies, not elsewhere classified: Secondary | ICD-10-CM

## 2019-04-03 NOTE — Progress Notes (Signed)
He presents today states that I am doing a lot better I am not his limping is much but I am still not able to go up on my toes or squat down.  He states that he still limited in the amount of time that he can be on his feet because it becomes tired and painful.  But otherwise I see that it is improving.  He failed to obtain his medication, the Indocin that I had prescribed.  He also did not take the steroid pack.  And he has failed to have blood work done twice.  He states that he cannot afford this because he has not received payment from work.  He goes on to say that we had sent him back to work on February 1 and he was not aware of this nor could he possibly do his job.  I explained to him that this must of been an oversight.  We had received a call requesting that he go back to work.  He states that he did not call requesting this and that it must of been his job.  He was not supposed to return to work per our office.  Objective: Vital signs are stable he is alert and oriented x3.  He has pain on maximal dorsiflexion of the first metatarsophalangeal joint and plantar flexion.  But he has great range of motion.  Edema has subsided considerably even the edema on the plantar aspect of the foot right where the plantar fibroma was excised is doing much better and small area of scar tissue that was once very painful is starting to reduce and subside with his symptoms.  Assessment slowly improving surgical foot right  Plan: Angelita Ingles request that he continue all of his current physical therapies.  I will not request another blood work at this time.  Should he have another flareup of swelling and pain we will request the blood work that day.  I did recommend that he obtain the Indocin just in case he has a flare that he will start taking that immediately.  To make this very clear he is not to return to work until I release him.

## 2019-05-14 ENCOUNTER — Ambulatory Visit: Payer: Self-pay | Admitting: Podiatry

## 2019-05-21 ENCOUNTER — Ambulatory Visit (INDEPENDENT_AMBULATORY_CARE_PROVIDER_SITE_OTHER): Payer: Self-pay | Admitting: Podiatry

## 2019-05-21 ENCOUNTER — Other Ambulatory Visit: Payer: Self-pay

## 2019-05-21 ENCOUNTER — Encounter: Payer: Self-pay | Admitting: Podiatry

## 2019-05-21 DIAGNOSIS — M778 Other enthesopathies, not elsewhere classified: Secondary | ICD-10-CM

## 2019-05-21 MED FILL — PARoxetine HCL 30 MG TABS: 30 | 30 days supply | Qty: 30 | Fill #1

## 2019-05-21 NOTE — Progress Notes (Signed)
He presents today for follow-up of capsulitis first metatarsophalangeal joint right foot states that I was cleaning yesterday and I had a really weird feeling that bothered it for several minutes. Also when I went to bed it was throbbing for a little while. He states that he is trying to perform duties at home like he would be doing at work. States that he is trying to go up on the test on his toes but he says that the dorsiflexion is painful for him he states that he is trying very hard to increase his mobility so that he will be able to return to work without complications.  Objective: Vital signs are stable he is alert oriented x3 pulses are palpable. This is probably the best off seeing his foot look much decrease in edema no erythema cellulitis drainage or odor great range of motion of the first metatarsophalangeal joint little bit tight on the top end. His fibroma is healing very nicely the scar tissue around the distalmost incision sites have is going on to heal quite nicely. It appears to be resolving.  Assessment: Slowly resolving surgical foot right.  Plan: Raquel Sarna encouraged him over the next 2 months to continue his physical activities so that we may be able to get back to work in about 2 months time. We did discuss appropriate shoes as well as something like a stiffer soled shoe so he can really start going up on his toes better.

## 2019-06-26 ENCOUNTER — Ambulatory Visit (INDEPENDENT_AMBULATORY_CARE_PROVIDER_SITE_OTHER): Payer: Self-pay | Admitting: Family Medicine

## 2019-06-26 ENCOUNTER — Other Ambulatory Visit: Payer: Self-pay

## 2019-06-26 ENCOUNTER — Ambulatory Visit (HOSPITAL_BASED_OUTPATIENT_CLINIC_OR_DEPARTMENT_OTHER)
Admission: RE | Admit: 2019-06-26 | Discharge: 2019-06-26 | Disposition: A | Discharge status: Home / Self Care | Payer: Self-pay | Source: Ambulatory Visit | Attending: Family Medicine | Admitting: Family Medicine

## 2019-06-26 ENCOUNTER — Other Ambulatory Visit: Payer: Self-pay | Admitting: Family Medicine

## 2019-06-26 ENCOUNTER — Encounter: Payer: Self-pay | Admitting: Family Medicine

## 2019-06-26 VITALS — BP 142/100 | HR 72 | Temp 95.6°F | Ht 74.0 in | Wt 273.1 lb

## 2019-06-26 DIAGNOSIS — T148XXA Other injury of unspecified body region, initial encounter: Secondary | ICD-10-CM

## 2019-06-26 DIAGNOSIS — M79642 Pain in left hand: Secondary | ICD-10-CM | POA: Insufficient documentation

## 2019-06-26 DIAGNOSIS — Z23 Encounter for immunization: Secondary | ICD-10-CM

## 2019-06-26 MED ORDER — TRAMADOL HCL 50 MG PO TABS: 50.0000 mg | ORAL_TABLET | Freq: Three times a day (TID) | ORAL | 0 refills | Status: DC | PRN

## 2019-06-26 MED FILL — PARoxetine HCL 30 MG TABS: 30 | 30 days supply | Qty: 30 | Fill #2

## 2019-06-26 MED FILL — LEVOTHYROXINE SODIUM 50 MCG: 50 | 30 days supply | Qty: 30 | Fill #0

## 2019-06-26 MED FILL — traMADol HCL 50 MG TABS: 50 | 5 days supply | Qty: 15 | Fill #0

## 2019-06-26 NOTE — Progress Notes (Signed)
.  sp
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
## 2019-06-26 NOTE — Patient Instructions (Addendum)
We will be in touch regarding your X-ray.  For your back, when you do wash it, use only soap and water. Do not vigorously scrub. Apply triple antibiotic ointment (like Neosporin) twice daily. Keep the area clean and dry.   Things to look out for: increasing pain not relieved by ibuprofen/acetaminophen, fevers, spreading redness, drainage of pus, or foul odor. It looks good today.  Ice/cold pack over area for 10-15 min twice daily.  OK to take Tylenol 1000 mg (2 extra strength tabs) or 975 mg (3 regular strength tabs) every 6 hours as needed.  Ibuprofen 400-600 mg (2-3 over the counter strength tabs) every 6 hours as needed for pain.  Do not drink alcohol, do any illicit/street drugs, drive or do anything that requires alertness while on this medicine.   Let us know if you need anything

## 2019-06-27 ENCOUNTER — Telehealth: Payer: Self-pay | Admitting: Family Medicine

## 2019-06-27 DIAGNOSIS — M79642 Pain in left hand: Secondary | ICD-10-CM
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
## 2019-06-28 MED ORDER — TRAMADOL HCL 50 MG PO TABS: 50.0000 mg | ORAL_TABLET | Freq: Three times a day (TID) | ORAL | 0 refills | Status: AC | PRN

## 2019-07-02 ENCOUNTER — Encounter: Payer: Self-pay | Admitting: Podiatry

## 2019-07-02 ENCOUNTER — Encounter: Payer: Self-pay | Admitting: Family Medicine

## 2019-07-02 ENCOUNTER — Ambulatory Visit (INDEPENDENT_AMBULATORY_CARE_PROVIDER_SITE_OTHER): Payer: Self-pay | Admitting: Family Medicine

## 2019-07-02 ENCOUNTER — Ambulatory Visit: Payer: Self-pay | Admitting: Podiatry

## 2019-07-02 ENCOUNTER — Other Ambulatory Visit: Payer: Self-pay

## 2019-07-02 VITALS — BP 162/108 | HR 71 | Ht 74.0 in | Wt 275.0 lb

## 2019-07-02 DIAGNOSIS — M778 Other enthesopathies, not elsewhere classified: Secondary | ICD-10-CM

## 2019-07-02 DIAGNOSIS — S62339A Displaced fracture of neck of unspecified metacarpal bone, initial encounter for closed fracture: Secondary | ICD-10-CM | POA: Insufficient documentation

## 2019-07-02 NOTE — Progress Notes (Addendum)
He presents today for follow-up of his capsulitis states that is still hurting.  He states that he cannot squat down and he can go up on his toes and he says that his job this is what he has to do he states that he is trying to do this at home so that he will be able to be better at work.  Objective: Vital signs are stable he is alert oriented x3 still has pain on range of motion of the first metatarsophalangeal joint no erythema no edema no cellulitis drainage odor scar is loose from the underlying skin.  Assessment: Continued postoperative pain with great range of motion no crepitation minimal to no edema.  Plan: Encouraged him to continue at home physical therapy in order to try and stimulate the activities that he would be performing at work.  So at this point we will continue him out of work until he feels like he is able to comfortably perform his job without restrictions.
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
## 2019-07-02 NOTE — Patient Instructions (Signed)
Nice to meet you Please try ice and elevation   Please send me a message in MyChart with any questions or updates.  Please see me back in 2 weeks.   --Dr. Raeford Razor

## 2019-07-16 ENCOUNTER — Ambulatory Visit (INDEPENDENT_AMBULATORY_CARE_PROVIDER_SITE_OTHER): Payer: Self-pay | Admitting: Family Medicine

## 2019-07-16 ENCOUNTER — Other Ambulatory Visit: Payer: Self-pay

## 2019-07-16 ENCOUNTER — Encounter: Payer: Self-pay | Admitting: Family Medicine

## 2019-07-16 ENCOUNTER — Ambulatory Visit (HOSPITAL_BASED_OUTPATIENT_CLINIC_OR_DEPARTMENT_OTHER)
Admission: RE | Admit: 2019-07-16 | Discharge: 2019-07-16 | Disposition: A | Discharge status: Home / Self Care | Payer: Self-pay | Source: Ambulatory Visit | Attending: Family Medicine | Admitting: Family Medicine

## 2019-07-16 VITALS — BP 155/110 | HR 80 | Ht 74.0 in | Wt 275.0 lb

## 2019-07-16 DIAGNOSIS — S62339D Displaced fracture of neck of unspecified metacarpal bone, subsequent encounter for fracture with routine healing: Secondary | ICD-10-CM | POA: Insufficient documentation
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
## 2019-07-16 NOTE — Progress Notes (Signed)
Tony Walters - 44 y.o. male MRN [id number]  Date of birth: [birth year]  SUBJECTIVE:  Including CC & ROS.  Chief Complaint  Patient presents with  . Follow-up    left hand    Dom Tony Walters is a 44 y.o. male that is following up for his left hand fracture.  He still has pain towards the end of the day.  He is swelling has improved.  He has good range of motion.  His stiffness goes away the more he flexes his fingers.  Denies any numbness or tingling.   Review of Systems See HPI   HISTORY: Past Medical, Surgical, Social, and Family History Reviewed & Updated per EMR.   Pertinent Historical Findings include:  Past Medical History:  Diagnosis Date  . Chicken pox as a child  . Elevated BP 04/04/2013  . Grief reaction 04/04/2013  . HTN (hypertension) 04/04/2013  . PTSD (post-traumatic stress disorder)   . Unspecified hypothyroidism 04/07/2013    Past Surgical History:  Procedure Laterality Date  . HYDROCELE EXCISION / REPAIR  44 years old  . WISDOM TOOTH EXTRACTION  2010    Family History  Problem Relation Age of Onset  . Congestive Heart Failure Father   . Hyperlipidemia Father   . Hypertension Father   . Kidney disease Maternal Grandmother   . Diabetes Maternal Grandmother        type 2  . Stroke Maternal Grandmother   . Alcohol abuse Paternal Grandmother   . Pneumonia Paternal Grandfather   . Asthma Brother     Social History   Socioeconomic History  . Marital status: Single    Spouse name: Not on file  . Number of children: Not on file  . Years of education: Not on file  . Highest education level: Not on file  Occupational History  . Not on file  Tobacco Use  . Smoking status: Current Every Day Smoker    Packs/day: 0.50    Years: 25.00    Pack years: 12.50    Types: Cigarettes  . Smokeless tobacco: Never Used  . Tobacco comment: quit in Nov 2014 but started back 03-18-13  Vaping Use  . Vaping Use: Never assessed  Substance and Sexual Activity  .  Alcohol use: Yes    Comment: occasionally  . Drug use: No  . Sexual activity: Not Currently    Partners: Female  Other Topics Concern  . Not on file  Social History Narrative  . Not on file   Social Determinants of Health   Financial Resource Strain:   . Difficulty of Paying Living Expenses:   Food Insecurity:   . Worried About Charity fundraiser in the Last Year:   . Arboriculturist in the Last Year:   Transportation Needs:   . Film/video editor (Medical):   Marland Kitchen Lack of Transportation (Non-Medical):   Physical Activity:   . Days of Exercise per Week:   . Minutes of Exercise per Session:   Stress:   . Feeling of Stress :   Social Connections:   . Frequency of Communication with Friends and Family:   . Frequency of Social Gatherings with Friends and Family:   . Attends Religious Services:   . Active Member of Clubs or Organizations:   . Attends Archivist Meetings:   Marland Kitchen Marital Status:   Intimate Partner Violence:   . Fear of Current or Ex-Partner:   . Emotionally Abused:   Marland Kitchen Physically Abused:   .  Sexually Abused:      PHYSICAL EXAM:  VS: BP (!) 155/110   Pulse 80   Ht 6\' 2"  (1.88 m)   Wt 275 lb (124.7 kg)   BMI 35.31 kg/m  Physical Exam Gen: NAD, alert, cooperative with exam, well-appearing MSK:  Left hand: Deformity of the fifth MCP joint. No malrotation or misalignment. Normal grip strength. Normal flexion extension of the fingers. Neurovascularly intact     ASSESSMENT & PLAN:   Boxer's metacarpal fracture, neck, closed Injury occurred on 5/31.  Has been splint of the past 2 weeks.  Swelling has improved as well as pain. -Discontinue splint at this time.  Counseled on home exercise therapy and supportive care. -X-ray. -Could consider occupational therapy if needed.

## 2019-07-16 NOTE — Patient Instructions (Signed)
Good to see you Please continue to ice  Please try the stretches  I will call with the results from today   Please send me a message in MyChart with any questions or updates.  Please see [place] back as needed.   --Dr. Raeford Razor

## 2019-07-18 ENCOUNTER — Telehealth: Payer: Self-pay | Admitting: Family Medicine

## 2019-07-18 NOTE — Telephone Encounter (Signed)
Informed of results.   Rosemarie Ax, MD [facility] 07/18/2019, 5:32 PM

## 2019-07-26 ENCOUNTER — Telehealth: Payer: Self-pay | Admitting: Podiatry

## 2019-07-26 NOTE — Telephone Encounter (Signed)
Val I changed it a little bit but the insurance may be getting to the point where they are not going to keep him out any longer. Try sending them this note

## 2019-07-26 NOTE — Telephone Encounter (Signed)
Tony Walters left a voicemail stating his long term disability was denied, due to your last office note. He was wondering if there was some way you could change the wording. Please advise?

## 2019-08-21 ENCOUNTER — Encounter: Payer: Self-pay | Admitting: Family Medicine

## 2019-08-21 ENCOUNTER — Other Ambulatory Visit: Payer: Self-pay

## 2019-08-21 ENCOUNTER — Ambulatory Visit (INDEPENDENT_AMBULATORY_CARE_PROVIDER_SITE_OTHER): Payer: Self-pay | Admitting: Family Medicine

## 2019-08-21 ENCOUNTER — Other Ambulatory Visit: Payer: Self-pay | Admitting: Family Medicine

## 2019-08-21 VITALS — BP 138/80 | HR 74 | Temp 97.9°F | Ht 74.0 in | Wt 269.1 lb

## 2019-08-21 DIAGNOSIS — E6609 Other obesity due to excess calories: Secondary | ICD-10-CM

## 2019-08-21 DIAGNOSIS — E039 Hypothyroidism, unspecified: Secondary | ICD-10-CM

## 2019-08-21 DIAGNOSIS — I10 Essential (primary) hypertension: Secondary | ICD-10-CM

## 2019-08-21 MED ORDER — PAROXETINE HCL 30 MG PO TABS: 30.0000 mg | ORAL_TABLET | Freq: Every day | ORAL | 2 refills | Status: DC

## 2019-08-21 MED ORDER — HYDROCHLOROTHIAZIDE 25 MG PO TABS: 25.0000 mg | ORAL_TABLET | Freq: Every day | ORAL | 2 refills | Status: DC

## 2019-08-21 MED ORDER — LEVOTHYROXINE SODIUM 50 MCG PO TABS: 50.0000 ug | ORAL_TABLET | Freq: Every day | ORAL | 2 refills | Status: DC

## 2019-08-21 MED FILL — PARoxetine HCL 30 MG TABS: 30 | 90 days supply | Qty: 90 | Fill #0

## 2019-08-21 MED FILL — LEVOTHYROXINE SODIUM 50 MCG: 50 | 90 days supply | Qty: 90 | Fill #0

## 2019-08-21 MED FILL — HYDROCHLOROTHIAZIDE 25 MG T: 25 | 90 days supply | Qty: 90 | Fill #0

## 2019-08-21 NOTE — Patient Instructions (Addendum)
Let's hold off on labs for now.   Keep the diet clean and stay active.  Let us know if you need anything.

## 2019-08-21 NOTE — Progress Notes (Signed)
Chief Complaint  Patient presents with   Annual Exam    Well Male Tony Walters is here for a complete physical.   His last physical was >1 year ago.  Current diet: in general, a "OK" diet.   Current exercise: Tries to walk Weight trend: stable Fatigue out of ordinary? No. Seat belt? Yes.     Health maintenance Tetanus- Yes HIV- Yes Hep C- Yes  Past Medical History:  Diagnosis Date   Chicken pox as a child   Elevated BP 04/04/2013   Grief reaction 04/04/2013   HTN (hypertension) 04/04/2013   PTSD (post-traumatic stress disorder)    Unspecified hypothyroidism 04/07/2013     Past Surgical History:  Procedure Laterality Date   HYDROCELE EXCISION / REPAIR  44 years old   [place] EXTRACTION  2010    Medications  Current Outpatient Medications on File Prior to Visit  Medication Sig Dispense Refill   hydrochlorothiazide (HYDRODIURIL) 25 MG tablet Take 1 tablet (25 mg total) by mouth daily. 90 tablet 2   levothyroxine (SYNTHROID) 50 MCG tablet Take 1 tablet (50 mcg total) by mouth daily. 30 tablet 3   PARoxetine (PAXIL) 30 MG tablet Take 1 tablet (30 mg total) by mouth daily. 30 tablet 2   Allergies No Known Allergies  Family History Family History  Problem Relation Age of Onset   Congestive Heart Failure Father    Hyperlipidemia Father    Hypertension Father    Kidney disease Maternal Grandmother    Diabetes Maternal Grandmother        type 2   Stroke Maternal Grandmother    Alcohol abuse Paternal Grandmother    Pneumonia Paternal Grandfather    Asthma Brother     Review of Systems: Constitutional: no fevers or chills Eye:  no recent significant change in vision Ear/Nose/Mouth/Throat:  Ears:  no hearing loss Nose/Mouth/Throat:  no complaints of nasal congestion, no sore throat Cardiovascular:  no chest pain Respiratory:  no shortness of breath Gastrointestinal:  no abdominal pain, no change in bowel habits GU:  Male: negative for  dysuria, frequency, and incontinence Musculoskeletal/Extremities:  no new pain of the joints Integumentary (Skin/Breast):  +cyst in R ear Neurologic:  no headaches Endocrine: No unexpected weight changes Hematologic/Lymphatic:  no night sweats  Exam BP (!) 138/80 (BP Location: Left Arm, Patient Position: Sitting, Cuff Size: Large)    Pulse 74    Temp 97.9 F (36.6 C) (Oral)    Ht 6\' 2"  (1.88 m)    Wt (!) 269 lb 2 oz (122.1 kg)    SpO2 99%    BMI 34.55 kg/m  General:  well developed, well nourished, in no apparent distress Skin:  no significant moles, warts, or growths Head:  no masses, lesions, or tenderness Eyes:  pupils equal and round, sclera anicteric without injection Ears:  canals without lesions, TMs shiny without retraction, no obvious effusion, no erythema Nose:  nares patent, septum midline, mucosa normal Throat/Pharynx:  lips and gingiva without lesion; tongue and uvula midline; non-inflamed pharynx; no exudates or postnasal drainage Neck: neck supple without adenopathy, thyromegaly, or masses Lungs:  clear to auscultation, breath sounds equal bilaterally, no respiratory distress Cardio:  regular rate and rhythm, no bruits, no LE edema Abdomen:  abdomen soft, nontender; bowel sounds normal; no masses or organomegaly Rectal: Deferred Musculoskeletal:  symmetrical muscle groups noted without atrophy or deformity Extremities:  no clubbing, cyanosis, or edema, no deformities, no skin discoloration Neuro:  gait normal; deep tendon reflexes  normal and symmetric Psych: well oriented with normal range of affect and appropriate judgment/insight  Assessment and Plan  Obesity due to excess calories without serious comorbidity, unspecified classification  Essential hypertension - Plan: hydrochlorothiazide (HYDRODIURIL) 25 MG tablet  Acquired hypothyroidism - Plan: levothyroxine (SYNTHROID) 50 MCG tablet   Changed WC to OV due to not having ins.  Counseled on diet and  exercise. Counseled on risks and benefits of prostate cancer screening with PSA. The patient agrees to forego screening.  Defer labs.  Follow up in 6 mo. The patient voiced understanding and agreement to the plan.  [place], DO 08/21/19 12:11 PM

## 2019-09-03 ENCOUNTER — Other Ambulatory Visit: Payer: Self-pay

## 2019-09-03 ENCOUNTER — Ambulatory Visit (INDEPENDENT_AMBULATORY_CARE_PROVIDER_SITE_OTHER): Payer: 59 | Admitting: Podiatry

## 2019-09-03 ENCOUNTER — Encounter: Payer: Self-pay | Admitting: Podiatry

## 2019-09-03 DIAGNOSIS — M778 Other enthesopathies, not elsewhere classified: Secondary | ICD-10-CM | POA: Diagnosis not present

## 2019-09-03 DIAGNOSIS — Z9889 Other specified postprocedural states: Secondary | ICD-10-CM

## 2019-09-03 MED ORDER — CELECOXIB 100 MG PO CAPS
100.0000 mg | ORAL_CAPSULE | Freq: Two times a day (BID) | ORAL | 3 refills | Status: DC
Start: 2019-09-03 — End: 2019-11-05

## 2019-09-03 MED FILL — LEVOTHYROXINE SODIUM 50 MCG: 50 | 90 days supply | Qty: 90 | Fill #0

## 2019-09-03 MED FILL — HYDROCHLOROTHIAZIDE 25 MG T: 25 | 90 days supply | Qty: 90 | Fill #0

## 2019-09-03 MED FILL — PARoxetine HCL 30 MG TABS: 30 | 90 days supply | Qty: 90 | Fill #0

## 2019-09-03 MED FILL — CELECOXIB 100 MG CAP: 100 | 30 days supply | Qty: 60 | Fill #0

## 2019-09-04 NOTE — Progress Notes (Signed)
He presents today for follow-up of his surgical foot.  He states that it still hurts but is some better I guess it really hurts on top and then directly on the very bottom right here as he points to the sesamoids.  He states that he has pain with pressure  Objective: Vital signs are stable he is alert and oriented x3.  He has pain on palpation to the sesamoids he has good range of motion of the joint though painful and taut band of dorsiflexion.  It is painful plantarly as well as dorsally with the dorsiflexion.  Review of previous radiographs demonstrate some osteoarthritic changes consistent with a entire joint needing to have been replaced anyway.  I do not know that he is going to be able to get over this osteoarthritis.  Assessment: Chronic postoperative pain and swelling  Plan: Discussed etiology pathology conservative versus surgical therapies.  At this point we will start him on Celebrex 100 mg twice daily we will request the start utilizing the orthotics that he has at home and he will bring those within the next time he comes to visit need to see if we need to see about getting another set made he will continue out of work until further notice.  He will continue therapy at home.

## 2019-11-05 ENCOUNTER — Ambulatory Visit (INDEPENDENT_AMBULATORY_CARE_PROVIDER_SITE_OTHER): Payer: 59 | Admitting: Podiatry

## 2019-11-05 ENCOUNTER — Other Ambulatory Visit: Payer: Self-pay

## 2019-11-05 ENCOUNTER — Encounter: Payer: Self-pay | Admitting: Podiatry

## 2019-11-05 DIAGNOSIS — M778 Other enthesopathies, not elsewhere classified: Secondary | ICD-10-CM | POA: Diagnosis not present

## 2019-11-05 MED ORDER — CELECOXIB 200 MG PO CAPS
200.0000 mg | ORAL_CAPSULE | Freq: Two times a day (BID) | ORAL | 3 refills | Status: DC
Start: 2019-11-05 — End: 2021-05-25

## 2019-11-05 MED FILL — CELECOXIB 200 MG CAP: 200 | 30 days supply | Qty: 60 | Fill #0

## 2019-11-05 NOTE — Progress Notes (Signed)
He presents today for follow-up of his capsulitis first metatarsophalangeal joint of the right foot.  States that the Celebrex does not seem to be strong enough.  States he recently got engaged.  States that there is no way that he can currently do his job they want him to get 100% when he goes back to work and he states that currently he is not on 100%.  States that he tries to wear his orthotics at home but they continue to bother his feet particularly the right one beneath the metatarsophalangeal joint.  Objective: Vital signs are stable alert and oriented x3 pulses are palpable.  There is no erythema much decrease in edema no cellulitis drainage or odor he still has pain on palpation of the sesamoids which are arthritic he also has limited range of motion the first metatarsophalangeal joint which appears to be getting a little bit worse rather than better.  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  Plan: Discussed etiology pathology conservative versus surgical therapies at this point time I highly recommended that he bring [place] his orthotics and get an appointment with Liliane Channel so that we could cut out beneath the first metatarsophalangeal joint and very much like a dancers pad to help offload the first metatarsophalangeal joint.  I do not feel that he could go back and do his job as described in the duties reports.  I feel that he will be limited at this point and currently our only other options are to try orthotics with a cut out and increasing his Celebrex for the arthritis.  I will follow-up with him in 2 to 3 months we will do another set of x-rays at that time.

## 2019-11-11 ENCOUNTER — Other Ambulatory Visit: Payer: Self-pay

## 2019-11-11 ENCOUNTER — Ambulatory Visit: Payer: 59 | Admitting: Orthotics

## 2019-11-11 DIAGNOSIS — M7751 Other enthesopathy of right foot: Secondary | ICD-10-CM

## 2019-11-11 DIAGNOSIS — M778 Other enthesopathies, not elsewhere classified: Secondary | ICD-10-CM

## 2019-11-11 DIAGNOSIS — M7752 Other enthesopathy of left foot: Secondary | ICD-10-CM

## 2019-11-11 DIAGNOSIS — Z9889 Other specified postprocedural states: Secondary | ICD-10-CM

## 2019-11-11 DIAGNOSIS — M205X1 Other deformities of toe(s) (acquired), right foot: Secondary | ICD-10-CM

## 2019-11-11 NOTE — Progress Notes (Signed)
Adjust f/o to add 1st met offload RIGHT.

## 2019-12-02 ENCOUNTER — Other Ambulatory Visit: Payer: 59 | Admitting: Orthotics

## 2019-12-05 ENCOUNTER — Ambulatory Visit: Payer: 59 | Admitting: Orthotics

## 2019-12-05 ENCOUNTER — Other Ambulatory Visit: Payer: Self-pay

## 2019-12-05 DIAGNOSIS — M778 Other enthesopathies, not elsewhere classified: Secondary | ICD-10-CM

## 2019-12-05 DIAGNOSIS — Z9889 Other specified postprocedural states: Secondary | ICD-10-CM

## 2019-12-05 DIAGNOSIS — M205X1 Other deformities of toe(s) (acquired), right foot: Secondary | ICD-10-CM

## 2019-12-05 NOTE — Progress Notes (Signed)
Patient came in today to pick up custom made foot orthotics.  The goals were accomplished and the patient reported no dissatisfaction with said orthotics.  Patient was advised of breakin period and how to report any issues. 

## 2019-12-26 ENCOUNTER — Telehealth: Payer: Self-pay | Admitting: Podiatry

## 2019-12-26 NOTE — Telephone Encounter (Signed)
Start him on triamcinolone cream once daily and have him make an appointment.

## 2019-12-26 NOTE — Telephone Encounter (Signed)
Tony Walters said he is still in a lot of pain even with orthotics and also he has something that looks like bubbles around his incision and it really itches.

## 2019-12-30 MED ORDER — TRIAMCINOLONE ACETONIDE 0.1 % EX CREA
1.0000 | TOPICAL_CREAM | Freq: Two times a day (BID) | CUTANEOUS | 3 refills | Status: DC
Start: 2019-12-30 — End: 2020-05-25

## 2019-12-30 MED FILL — TRIAMCINOLONE 0.1% CREAM: 0.1 | 30 days supply | Qty: 45 | Fill #0

## 2019-12-30 NOTE — Addendum Note (Signed)
Addended by: Clovis Riley E on: 12/30/2019 11:10 AM   Modules accepted: Orders

## 2020-01-02 ENCOUNTER — Other Ambulatory Visit: Payer: Self-pay

## 2020-01-02 ENCOUNTER — Emergency Department (HOSPITAL_BASED_OUTPATIENT_CLINIC_OR_DEPARTMENT_OTHER)
Admission: EM | Admit: 2020-01-02 | Discharge: 2020-01-02 | Disposition: A | Discharge status: Home / Self Care | Payer: 59 | Attending: Emergency Medicine | Admitting: Emergency Medicine

## 2020-01-02 ENCOUNTER — Encounter (HOSPITAL_BASED_OUTPATIENT_CLINIC_OR_DEPARTMENT_OTHER): Payer: Self-pay | Admitting: *Deleted

## 2020-01-02 ENCOUNTER — Emergency Department (HOSPITAL_BASED_OUTPATIENT_CLINIC_OR_DEPARTMENT_OTHER): Payer: 59

## 2020-01-02 DIAGNOSIS — I10 Essential (primary) hypertension: Secondary | ICD-10-CM | POA: Diagnosis not present

## 2020-01-02 DIAGNOSIS — R059 Cough, unspecified: Secondary | ICD-10-CM | POA: Diagnosis present

## 2020-01-02 DIAGNOSIS — Z20822 Contact with and (suspected) exposure to covid-19: Secondary | ICD-10-CM | POA: Diagnosis not present

## 2020-01-02 DIAGNOSIS — Z79899 Other long term (current) drug therapy: Secondary | ICD-10-CM | POA: Diagnosis not present

## 2020-01-02 DIAGNOSIS — F17210 Nicotine dependence, cigarettes, uncomplicated: Secondary | ICD-10-CM | POA: Diagnosis not present

## 2020-01-02 DIAGNOSIS — E039 Hypothyroidism, unspecified: Secondary | ICD-10-CM | POA: Diagnosis not present

## 2020-01-02 DIAGNOSIS — J069 Acute upper respiratory infection, unspecified: Secondary | ICD-10-CM | POA: Insufficient documentation

## 2020-01-02 LAB — RESP PANEL BY RT-PCR (FLU A&B, COVID) ARPGX2
Influenza A by PCR: NEGATIVE
Influenza B by PCR: NEGATIVE
SARS Coronavirus 2 by RT PCR: NEGATIVE

## 2020-01-02 NOTE — ED Provider Notes (Signed)
[facility] Provider Note   CSN: [id number] Arrival date & time: 01/02/20  1329     History Chief Complaint  Patient presents with  . Cough    Tony Walters is a 44 y.o. male.  HPI 44 year old male with a history of hypertension, tobacco abuse, obesity, hypothyroidism presents to the ER with 3 days of cough, fatigue and overall feeling unwell.  No known fevers.  Has had a dry nonproductive cough.  States that his girlfriend made him come here to get tested for Covid.  He is vaccinated.  No other sick contacts.  No chest pain or shortness of breath.  No back pain, dysuria.  No loss of taste or smell. No nausea or vomiting     Past Medical History:  Diagnosis Date  . Chicken pox as a child  . Elevated BP 04/04/2013  . Grief reaction 04/04/2013  . HTN (hypertension) 04/04/2013  . PTSD (post-traumatic stress disorder)   . Unspecified hypothyroidism 04/07/2013    Patient Active Problem List   Diagnosis Date Noted  . Boxer's metacarpal fracture, neck, closed 07/02/2019  . Premature ejaculation 03/21/2018  . Tobacco abuse 05/03/2017  . Depression with anxiety 05/09/2013  . Obesity, unspecified 04/07/2013  . Insomnia 04/07/2013  . Hypothyroidism 04/07/2013  . Essential hypertension 04/04/2013  . Grief reaction 04/04/2013    Past Surgical History:  Procedure Laterality Date  . HYDROCELE EXCISION / REPAIR  44 years old  . WISDOM TOOTH EXTRACTION  2010       Family History  Problem Relation Age of Onset  . Congestive Heart Failure Father   . Hyperlipidemia Father   . Hypertension Father   . Kidney disease Maternal Grandmother   . Diabetes Maternal Grandmother        type 2  . Stroke Maternal Grandmother   . Alcohol abuse Paternal Grandmother   . Pneumonia Paternal Grandfather   . Asthma Brother     Social History   Tobacco Use  . Smoking status: Current Every Day Smoker    Packs/day: 0.50    Years: 25.00    Pack years: 12.50     Types: Cigarettes  . Smokeless tobacco: Never Used  . Tobacco comment: quit in Nov 2014 but started back 03-18-13  Substance Use Topics  . Alcohol use: Yes    Comment: occasionally  . Drug use: No    Home Medications Prior to Admission medications   Medication Sig Start Date End Date Taking? Authorizing Provider  hydrochlorothiazide (HYDRODIURIL) 25 MG tablet Take 1 tablet (25 mg total) by mouth daily. 08/21/19  Yes Shelda Pal, DO  levothyroxine (SYNTHROID) 50 MCG tablet Take 1 tablet (50 mcg total) by mouth daily. 08/21/19  Yes Shelda Pal, DO  PARoxetine (PAXIL) 30 MG tablet Take 1 tablet (30 mg total) by mouth daily. 08/21/19  Yes Shelda Pal, DO  celecoxib (CELEBREX) 200 MG capsule Take 1 capsule (200 mg total) by mouth 2 (two) times daily. 11/05/19   Hyatt, Max T, DPM  triamcinolone (KENALOG) 0.1 % Apply 1 application topically 2 (two) times daily. 12/30/19   Hyatt, Max T, DPM    Allergies    Patient has no known allergies.  Review of Systems   Review of Systems  Constitutional: Positive for activity change and fatigue. Negative for appetite change, chills and fever.  HENT: Negative for ear pain and sore throat.   Eyes: Negative for pain and visual disturbance.  Respiratory: Positive for cough.  Negative for shortness of breath.   Cardiovascular: Negative for chest pain and palpitations.  Gastrointestinal: Negative for abdominal pain and vomiting.  Genitourinary: Negative for dysuria and hematuria.  Musculoskeletal: Negative for arthralgias and back pain.  Skin: Negative for color change and rash.  Neurological: Positive for weakness. Negative for seizures and syncope.  All other systems reviewed and are negative.   Physical Exam Updated Vital Signs BP (!) 133/93   Pulse 88   Temp 98.3 F (36.8 C) (Oral)   Resp 20   Ht 6\' 2"  (1.88 m)   Wt 129.3 kg   SpO2 99%   BMI 36.59 kg/m   Physical Exam Vitals and nursing note reviewed.   Constitutional:      General: He is not in acute distress.    Appearance: He is well-developed and well-nourished. He is obese. He is not ill-appearing, toxic-appearing or diaphoretic.  HENT:     Head: Normocephalic and atraumatic.  Eyes:     Conjunctiva/sclera: Conjunctivae normal.  Cardiovascular:     Rate and Rhythm: Normal rate and regular rhythm.     Pulses: Normal pulses.     Heart sounds: Normal heart sounds. No murmur heard.   Pulmonary:     Effort: Pulmonary effort is normal. No respiratory distress.     Breath sounds: Normal breath sounds.  Abdominal:     General: Abdomen is flat.     Palpations: Abdomen is soft.     Tenderness: There is no abdominal tenderness.  Musculoskeletal:        General: No edema. Normal range of motion.     Cervical back: Neck supple.  Skin:    General: Skin is warm and dry.     Findings: No rash.  Neurological:     General: No focal deficit present.     Mental Status: He is alert.  Psychiatric:        Mood and Affect: Mood and affect normal.     ED Results / Procedures / Treatments   Labs (all labs ordered are listed, but only abnormal results are displayed) Labs Reviewed  RESP PANEL BY RT-PCR (FLU A&B, COVID) [id number]    EKG None  Radiology DG Chest Portable 1 View  Result Date: 01/02/2020 CLINICAL DATA:  Cough, shortness of breath and fatigue. EXAM: PORTABLE CHEST 1 VIEW COMPARISON:  July 16, 2015 FINDINGS: The heart size and mediastinal contours are within normal limits. Both lungs are clear. The visualized skeletal structures are unremarkable. IMPRESSION: No active disease. Electronically Signed   By: Virgina Norfolk M.D.   On: 01/02/2020 15:06    Procedures Procedures (including critical care time)  Medications Ordered in ED Medications - No data to display  ED Course  I have reviewed the triage vital signs and the nursing notes.  Pertinent labs & imaging results that were available during my care of the patient  were reviewed by me and considered in my medical decision making (see chart for details).    MDM Rules/Calculators/A&P                         44 year old male who presents with cough and fatigue On arrival, he is well-appearing, nontoxic, speaking full sentences without increased work of breathing.  Vitals overall reassuring, afebrile, not hypoxic tachypneic or tachycardic.  Physical exam with clear lung sounds, soft nontender abdomen.  Chest x-ray reviewed by myself and radiology, with no evidence of pneumonia or intrathoracic process.  Covid test  is pending.  With no evidence of hypoxia, patient being overall well-appearing, suspect viral URI or possible Covid.  Patient was instructed on supportive measures.  Instructed to follow-up on the results via MyChart, and to quarantine until he receives his results.  Return precautions discussed.  He voiced understanding and is agreeable.  At this stage in the ED course, the patient is medically screened and stable for discharge.  Final Clinical Impression(s) / ED Diagnoses Final diagnoses:  Viral URI    Rx / DC Orders ED Discharge Orders    None       Garald Balding, PA-C 01/02/20 1536    Lucrezia Starch, MD 01/03/20 [phone]

## 2020-01-02 NOTE — ED Notes (Signed)
Pt. Reports he started with a cough yesterday and is a dry cough.  Pt. Reports he has not been around anyone with Covid.  Pt. Reports he does smoke. Pt. Reports he is out of work at the present time.    Pt. Does report he has had some chest congestion.

## 2020-01-02 NOTE — ED Triage Notes (Signed)
Cough, sob, fatigue since yesterday.

## 2020-01-02 NOTE — Discharge Instructions (Signed)
Your work-up today was overall reassuring.  Your chest x-ray did not show any evidence of pneumonia.  You were tested for COVID-19 today, please follow-up on these results via [place].  Please quarantine until you receive these results.  Quarantine additional 7 to 10 days if this is positive.  You may drink plenty of fluids, over-the-counter cold and flu medications, take Tylenol/ibuprofen for fever, use Flonase or Zyrtec for nasal congestion.  Return to the ER for any new or worsening symptoms.

## 2020-01-07 ENCOUNTER — Ambulatory Visit (INDEPENDENT_AMBULATORY_CARE_PROVIDER_SITE_OTHER): Payer: 59

## 2020-01-07 ENCOUNTER — Encounter: Payer: Self-pay | Admitting: Podiatry

## 2020-01-07 ENCOUNTER — Other Ambulatory Visit: Payer: Self-pay

## 2020-01-07 ENCOUNTER — Ambulatory Visit (INDEPENDENT_AMBULATORY_CARE_PROVIDER_SITE_OTHER): Payer: 59 | Admitting: Podiatry

## 2020-01-07 DIAGNOSIS — M778 Other enthesopathies, not elsewhere classified: Secondary | ICD-10-CM | POA: Diagnosis not present

## 2020-01-07 DIAGNOSIS — L301 Dyshidrosis [pompholyx]: Secondary | ICD-10-CM | POA: Diagnosis not present

## 2020-01-07 NOTE — Progress Notes (Signed)
He presents today for follow-up of his right foot.  He states that is still hard to squat and bend he says but it does seem to be a little bit better than it was last time.  States that he is just starting to get his disability to go through now and he is trying to wear regular shoes.  States that he still has a lot of itching bottom of his arch.  States that he did not pick up the cortisone cream that I have prescribed for him.  Objective: Vital signs are stable alert and oriented x3.  There are small patches of what appears to be dyshidrotic eczema plantar aspect of medial longitudinal arch right foot.  He has still has stiff painful range of motion of the first metatarsophalangeal joint of the right foot.  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  Well-healed plantar fibroma excision.  Plan: At this point I will go ahead and follow-up with him again in 2 months I will not recommend that he get the triamcinolone cream that I had prescribed.  Explaining to him to only put on that small amounts twice daily and not to get down any the rest of his skin otherwise able to leave a white spot.  He understands and is amenable to follow-up with me in 2 months I would still recommend that he not perform any work duties at this point

## 2020-02-21 ENCOUNTER — Other Ambulatory Visit: Payer: Self-pay

## 2020-02-21 ENCOUNTER — Ambulatory Visit (INDEPENDENT_AMBULATORY_CARE_PROVIDER_SITE_OTHER): Payer: 59 | Admitting: Family Medicine

## 2020-02-21 ENCOUNTER — Encounter: Payer: Self-pay | Admitting: Family Medicine

## 2020-02-21 ENCOUNTER — Other Ambulatory Visit: Payer: Self-pay | Admitting: Family Medicine

## 2020-02-21 VITALS — BP 154/98 | HR 67 | Resp 18 | Ht 74.0 in | Wt 288.0 lb

## 2020-02-21 DIAGNOSIS — N6452 Nipple discharge: Secondary | ICD-10-CM | POA: Diagnosis not present

## 2020-02-21 DIAGNOSIS — Z1211 Encounter for screening for malignant neoplasm of colon: Secondary | ICD-10-CM

## 2020-02-21 DIAGNOSIS — R7989 Other specified abnormal findings of blood chemistry: Secondary | ICD-10-CM

## 2020-02-21 DIAGNOSIS — Z0001 Encounter for general adult medical examination with abnormal findings: Secondary | ICD-10-CM | POA: Diagnosis not present

## 2020-02-21 DIAGNOSIS — S46211A Strain of muscle, fascia and tendon of other parts of biceps, right arm, initial encounter: Secondary | ICD-10-CM | POA: Diagnosis not present

## 2020-02-21 DIAGNOSIS — R030 Elevated blood-pressure reading, without diagnosis of hypertension: Secondary | ICD-10-CM

## 2020-02-21 LAB — COMPREHENSIVE METABOLIC PANEL
ALT: 77 U/L — ABNORMAL HIGH (ref 0–53)
AST: 50 U/L — ABNORMAL HIGH (ref 0–37)
Albumin: 4.6 g/dL (ref 3.5–5.2)
Alkaline Phosphatase: 79 U/L (ref 39–117)
BUN: 14 mg/dL (ref 6–23)
CO2: 32 mEq/L (ref 19–32)
Calcium: 10 mg/dL (ref 8.4–10.5)
Chloride: 100 mEq/L (ref 96–112)
Creatinine, Ser: 1.23 mg/dL (ref 0.40–1.50)
GFR: 70.51 mL/min (ref >60.00)
Glucose, Bld: 99 mg/dL (ref 70–99)
Potassium: 4 mEq/L (ref 3.5–5.1)
Sodium: 138 mEq/L (ref 135–145)
Total Bilirubin: 0.6 mg/dL (ref 0.2–1.2)
Total Protein: 7.7 g/dL (ref 6.0–8.3)

## 2020-02-21 LAB — LIPID PANEL
Cholesterol: 181 mg/dL (ref 0–200)
HDL: 53 mg/dL (ref >39.00)
LDL Cholesterol: 101 mg/dL — ABNORMAL HIGH (ref 0–99)
NonHDL: 127.9
Total CHOL/HDL Ratio: 3
Triglycerides: 136 mg/dL (ref 0.0–149.0)
VLDL: 27.2 mg/dL (ref 0.0–40.0)

## 2020-02-21 MED ORDER — PAROXETINE HCL 40 MG PO TABS: 40.0000 mg | ORAL_TABLET | ORAL | 2 refills | Status: DC

## 2020-02-21 MED FILL — PARoxetine HCL 40 MG TABS: 40 | 30 days supply | Qty: 30 | Fill #0

## 2020-02-21 MED FILL — LEVOTHYROXINE SODIUM 50 MCG: 50 | 90 days supply | Qty: 90 | Fill #1

## 2020-02-21 MED FILL — HYDROCHLOROTHIAZIDE 25 MG T: 25 | 90 days supply | Qty: 90 | Fill #1

## 2020-02-21 NOTE — Progress Notes (Signed)
Chief Complaint  Patient presents with  . Annual Exam    Well Male Tony Walters is here for a complete physical.   His last physical was >1 year ago.  Current diet: in general, a "healthy" diet.   Current exercise: none currently Weight trend: stable Fatigue out of ordinary? No. Seat belt? Yes.    Health maintenance Tetanus- Yes HIV- Yes Hep C- Yes  CCS- No  1 week of R elbow pain. Lifting something off his truck and felt a pop. No bruising or swelling. Hurts to bend elbow and turn over hand. No neuro s/s's. Has not tried anything.   2 weeks of pain under R nipple. He will have clear d/c from it. Does not seem to be like milk. No bleeding. Denies injury.    Past Medical History:  Diagnosis Date  . Chicken pox as a child  . Elevated BP 04/04/2013  . Grief reaction 04/04/2013  . HTN (hypertension) 04/04/2013  . PTSD (post-traumatic stress disorder)   . Unspecified hypothyroidism 04/07/2013     Past Surgical History:  Procedure Laterality Date  . HYDROCELE EXCISION / REPAIR  45 years old  . WISDOM TOOTH EXTRACTION  2010    Medications  Current Outpatient Medications on File Prior to Visit  Medication Sig Dispense Refill  . celecoxib (CELEBREX) 200 MG capsule Take 1 capsule (200 mg total) by mouth 2 (two) times daily. 60 capsule 3  . hydrochlorothiazide (HYDRODIURIL) 25 MG tablet Take 1 tablet (25 mg total) by mouth daily. 90 tablet 2  . levothyroxine (SYNTHROID) 50 MCG tablet Take 1 tablet (50 mcg total) by mouth daily. 90 tablet 2  . PARoxetine (PAXIL) 30 MG tablet Take 1 tablet (30 mg total) by mouth daily. 90 tablet 2  . triamcinolone (KENALOG) 0.1 % Apply 1 application topically 2 (two) times daily. 45 g 3   Allergies No Known Allergies  Family History Family History  Problem Relation Age of Onset  . Congestive Heart Failure Father   . Hyperlipidemia Father   . Hypertension Father   . Kidney disease Maternal Grandmother   . Diabetes Maternal Grandmother         type 2  . Stroke Maternal Grandmother   . Alcohol abuse Paternal Grandmother   . Pneumonia Paternal Grandfather   . Asthma Brother     Review of Systems: Constitutional: no fevers or chills Eye:  no recent significant change in vision Ear/Nose/Mouth/Throat:  Ears:  no hearing loss Nose/Mouth/Throat:  no complaints of nasal congestion, no sore throat Cardiovascular:  no chest pain Respiratory:  no shortness of breath Gastrointestinal:  no abdominal pain, no change in bowel habits GU:  Male: negative for dysuria, frequency, and incontinence Musculoskeletal/Extremities:  +elbow pain Integumentary (Skin/Breast):  no abnormal skin lesions reported Neurologic:  no headaches Endocrine: +nipple dc Hematologic/Lymphatic:  no unexplained wt loss  Exam BP (!) 154/98 (BP Location: Left Arm, Patient Position: Sitting, Cuff Size: Large)   Pulse 67   Resp 18   Ht 6\' 2"  (1.88 m)   Wt 288 lb (130.6 kg)   SpO2 98%   BMI 36.98 kg/m  General:  well developed, well nourished, in no apparent distress Skin: No ttp over R nipple, no erythema, ecchymosis, induration, fluctuance, nipple dc, nodules in chest/breast area or in axillae.  Head:  no masses, lesions, or tenderness Eyes:  pupils equal and round, sclera anicteric without injection Ears:  canals without lesions, TMs shiny without retraction, no obvious effusion, no erythema  Nose:  nares patent, septum midline, mucosa normal Throat/Pharynx:  lips and gingiva without lesion; tongue and uvula midline; non-inflamed pharynx; no exudates or postnasal drainage Neck: neck supple without adenopathy, thyromegaly, or masses Lungs:  clear to auscultation, breath sounds equal bilaterally, no respiratory distress Cardio:  regular rate and rhythm, no bruits, no LE edema Abdomen:  abdomen soft, nontender; bowel sounds normal; no masses or organomegaly Rectal: Deferred Musculoskeletal: Mild ttp over the R brachioradialis, pain with supination and R  elbow flexion Extremities:  no clubbing, cyanosis, or edema, no deformities, no skin discoloration Neuro:  gait normal; deep tendon reflexes 0/4 and symmetric Psych: well oriented with normal range of affect and appropriate judgment/insight  Assessment and Plan  Encounter for well adult exam with abnormal findings - Plan: Comprehensive metabolic panel, Lipid panel  Nipple discharge in male - Plan: US BREAST COMPLETE UNI RIGHT INC AXILLA, MM Digital Diagnostic Unilat R  Strain of right biceps, initial encounter  Screen for colon cancer - Plan: Ambulatory referral to Gastroenterology  Elevated blood pressure reading   Well 45 y.o. male. Counseled on diet and exercise. Counseled on risks and benefits of prostate cancer screening with PSA. The patient agrees to forego screening.  Ck imaging for uni nipple d/c.  Stretches/exercises given for elbow. PT if no better. BP still elevated on reck. Cont HCTZ 25 mg/d. F/u in 1 mo. Monitor BP at home. Would add ARB if still elevated.  Other orders as above. Follow up in 1 mo pending the above workup. The patient voiced understanding and agreement to the plan.  [place], DO 02/21/20 9:01 AM

## 2020-02-21 NOTE — Patient Instructions (Addendum)
Keep the diet clean and stay active.  Give [place] 2-3 business days to get the results of your labs back.   If you do not hear anything about your referral in the next 1-2 weeks, call our office and ask for an update.   Check your blood pressure at home.    Let us know if you need anything.

## 2020-02-21 NOTE — Addendum Note (Signed)
Addended byDamita Dunnings D on: 02/21/2020 03:03 PM   Modules accepted: Orders

## 2020-02-25 HISTORY — PX: COLONOSCOPY: SHX174

## 2020-03-10 ENCOUNTER — Ambulatory Visit: Payer: 59 | Admitting: Podiatry

## 2020-03-24 ENCOUNTER — Other Ambulatory Visit: Payer: Self-pay | Admitting: Podiatry

## 2020-03-24 ENCOUNTER — Other Ambulatory Visit: Payer: Self-pay

## 2020-03-24 ENCOUNTER — Encounter: Payer: Self-pay | Admitting: Podiatry

## 2020-03-24 ENCOUNTER — Ambulatory Visit (INDEPENDENT_AMBULATORY_CARE_PROVIDER_SITE_OTHER): Payer: 59 | Admitting: Podiatry

## 2020-03-24 DIAGNOSIS — B353 Tinea pedis: Secondary | ICD-10-CM | POA: Diagnosis not present

## 2020-03-24 DIAGNOSIS — M778 Other enthesopathies, not elsewhere classified: Secondary | ICD-10-CM | POA: Diagnosis not present

## 2020-03-24 MED ORDER — TERBINAFINE HCL 250 MG PO TABS: 250.0000 mg | ORAL_TABLET | Freq: Every day | ORAL | 0 refills | Status: DC

## 2020-03-24 MED FILL — TERBINAFINE HCL 250 MG TAB: 250 | 30 days supply | Qty: 30 | Fill #0

## 2020-03-24 NOTE — Progress Notes (Signed)
He presents today for follow-up of his capsulitis 1st metatarsophalangeal joint.  States that is still painful particularly with activity.  He still concerned about a rash to the plantar aspect of the right foot.  Objective: Vital signs are stable he is alert and oriented x3.  Pulses are palpable.  He still has limited range of motion of the 1st metatarsophalangeal joint with tenderness on palpation of the sesamoids.  He has a small rash central arch appears to be tinea pedis possibly a dyshidrotic eczema.  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  Plan: At this point encourage range of motion exercises massage therapy anti-inflammatory topical Voltaren and I started him back on Lamisil 250 mg tablets 1 p.o. daily for the rash.  Should this not alleviate that rash and obviously it would be a dyshidrotic eczema.

## 2020-03-27 ENCOUNTER — Encounter: Payer: Self-pay | Admitting: Gastroenterology

## 2020-04-20 ENCOUNTER — Other Ambulatory Visit: Payer: Self-pay | Admitting: Family Medicine

## 2020-04-20 ENCOUNTER — Ambulatory Visit (INDEPENDENT_AMBULATORY_CARE_PROVIDER_SITE_OTHER): Payer: 59 | Admitting: Family Medicine

## 2020-04-20 ENCOUNTER — Encounter: Payer: Self-pay | Admitting: Family Medicine

## 2020-04-20 ENCOUNTER — Other Ambulatory Visit: Payer: Self-pay

## 2020-04-20 VITALS — BP 152/90 | HR 68 | Temp 98.6°F | Ht 74.0 in | Wt 291.2 lb

## 2020-04-20 DIAGNOSIS — S46211A Strain of muscle, fascia and tendon of other parts of biceps, right arm, initial encounter: Secondary | ICD-10-CM | POA: Diagnosis not present

## 2020-04-20 DIAGNOSIS — I10 Essential (primary) hypertension: Secondary | ICD-10-CM | POA: Diagnosis not present

## 2020-04-20 DIAGNOSIS — R0683 Snoring: Secondary | ICD-10-CM

## 2020-04-20 MED ORDER — OLMESARTAN MEDOXOMIL-HCTZ 40-25 MG PO TABS
1.0000 | ORAL_TABLET | Freq: Every day | ORAL | 3 refills | Status: DC
Start: 2020-04-20 — End: 2020-04-20

## 2020-04-20 MED FILL — OLMESARTAN-HCTZ 40-25 MG TA: 40-25 | 30 days supply | Qty: 30 | Fill #0

## 2020-04-20 NOTE — Patient Instructions (Addendum)
Keep the diet clean and stay active.  Monitor your blood pressure at home.   Send me a message in the next couple weeks if no better with the forearm.   If you do not hear anything about your referral in the next 1-2 weeks, call our office and ask for an update.  Let us know if you need anything  Biceps Tendon Disruption (Distal) Rehab Ask your health care provider which exercises are safe for you. Do exercises exactly as told by your health care provider and adjust them as directed. It is normal to feel mild stretching, pulling, tightness, or discomfort as you do these exercises, but you should stop right away if you feel sudden pain or your pain gets worse. Do not begin these exercises until told by your health care provider. Stretching and range of motion exercises These exercises warm up your muscles and joints and improve the movement and flexibility of your arm. These exercises can also help to relieve pain, numbness, and tingling. Exercise A: Elbow flexion, passive  1. Stand or sit with your left / right arm at your side. 2. Use your other hand to gently push your left / right hand toward your shoulder. Bend your elbow as far as your health care provider tells you to. You may be instructed to try to bend your elbow more and more over time. 3. Hold for 10 seconds. 4. Slowly return to the starting position. Repeat 2 times. Complete this exercise 3 times per week. Exercise B: Supination, passive 1. Sit with your left / right elbow bent to an "L" shape (90 degrees) and your forearm resting on a table, palm-down. 2. Keeping your upper body and shoulder still, use your other hand to rotate your left / right palm up. Stop when you feel a gentle to moderate stretch. 3. Hold for 10 seconds. 4. Slowly return to the starting position. Repeat 2 times. Complete this exercise 3 times per week. Exercise C: Pronation, passive  1. Sit with your left / right elbow bent to an "L" shape (90 degrees)  and your forearm resting on a table, palm-up. 2. Keeping your upper body and shoulder still, use your other hand to rotate your left / right palm down. Stop when you feel a gentle to moderate stretch. 3. Hold for 10 seconds. 4. Slowly return to the starting position. Repeat 2 times. Complete this exercise 3 times per week. Exercise D: Elbow extension, active Start this exercise only when your health care provider tells you. 1. Stand or sit with your left / right elbow bent and your palm facing in, toward your body. 2. Slowly straighten your elbow using only your arm muscles. Stop when you feel a very slight stretch at the front of your arm, or when you reach the position where your health care provider tells you to stop. You may be instructed to try to straighten your arm more and more each week. 3. Hold for 3 seconds. 4. Slowly return to the starting position. Repeat 2 times. Complete this exercise 3 times per week. Exercise E: Elbow flexion, active Start this exercise only when your health care provider tells you. 1. Stand or sit with your left / right elbow bent and your palm facing in, toward your body. 2. Bend your elbow as far as you can using only your arm muscles. 3. Hold for 3 seconds. 4. Slowly return to the starting position. Repeat 2 times. Complete this exercise 3 times per week. Exercise F:  Supination, active Start this exercise only when your health care provider tells you. 1. Stand or sit with your left / right elbow bent to an "L" shape (90 degrees). 2. Rotate your palm up until you feel a gentle stretch on the inside of your forearm. 3. Hold for 3 seconds. 4. Slowly return to the starting position. Repeat 2 times. Complete this exercise 3 times per week. Exercise G: Pronation, active  Start this exercise only when your health care provider tells you. 1. Stand or sit with your left / right elbow bent to an "L" shape (90 degrees). 2. Rotate your palm down until you feel  a gentle stretch on the top of your forearm. 3. Hold for 3 seconds. 4. Slowly release and return to the starting position. Repeat 2 times. Complete this exercise 3 times per week. Exercise H: Elbow extension, passive, supine 1. Lie on your back in a comfortable position that allows you to relax your arm muscles. 2. Place a folded towel under your left / right upper arm so your elbow and shoulder are at the same height. 3. Use your other arm to raise your left / right arm until your elbow does not rest on the bed or towel. Hold your left / right arm out straight with your other hand supporting it. Let the weight of your hand stretch the inside of your elbow. ? Keep your arm and chest muscles relaxed. ? If directed, you may hold a 5-10 weight in your hand to increase the intensity of the stretch. 4. Hold for 3 seconds. 5. Slowly release the stretch. Repeat 2 times. Complete this exercise 3 times per week Strengthening exercises These exercises build strength and endurance in your arm and shoulder. Endurance is the ability to use your muscles for a long time, even after your muscles get tired. Do not start any strengthening exercises until told by your health care provider. Exercise I: Elbow flexion, isometric  1. Stand or sit with your left / right arm at waist height. Your palm should face in, toward your body. 2. Place your other hand on top of your left / right forearm. Gently push down while you resist with your left / right arm. ? Use about half (50%) effort with both arms. You may be instructed to use more and more effort with your arms each week. ? Try not to let your right / left elbow move. 3. Hold for 3 seconds. 4. Let your muscles relax completely before you repeat this exercise. Repeat 2 times. Complete this exercise 3 times per week. Exercise J: Elbow extension, isometric  1. Stand or sit with your left / right arm at waist height. Your palm should face in, toward your  body. 2. Place your other hand on the bottom of your left / right forearm. Gently push up while you resist with your left / right arm. ? Use about half (50%) effort with both arms. You may be instructed to use more and more effort with your arms each week. ? Try not to let your left / right elbow move. 3. Hold for 3 seconds. 4. Let your muscles relax completely before you repeat this exercise. Repeat 2 times. Complete this exercise 3 times per week. Exercise K: Biceps curls ( elbow flexion, supinated) 1. Sit on a stable chair without armrests, or stand. 2. Hold a 5-10 lb weight in your left / right hand. Your palm should face out, away from your body, at the starting position.  3. Bend your left / right elbow and move your hand up toward your shoulder. 4. Slowly return to the starting position. Repeat 2 times. Complete this exercise 3 times per week. Exercise L: Hammer curls ( elbow flexion, neutral forearm) 1. Sit on a stable chair without armrests, or stand. 2. Hold a 5-10 lb weight in your left / right hand, or hold an exercise band with both hands. Your palms should face each other at the starting position. 3. Bend your left / right elbow and move your hand up toward your shoulder. Keep your other arm straight. 4. Slowly return to the starting position. Repeat 2 times. Complete this exercise 3 times per week. Exercise M: Triceps curls ( elbow extension) 1. Lie on your back. 2. Hold a 5-10 lb weight in your left / right hand. 3. Bend your left / right elbow to an "L" shape (90 degrees) so the weight is in front of your face, over your chest, and your elbow is pointed up to the ceiling. 4. Straighten your elbow, raising your hand toward the ceiling. Use your other hand to support your left / right upper arm. 5. Slowly return to the starting position. Repeat 2 times. Complete this exercise 3 times per week. Exercise N: Elbow extension with exercise band  1. Sit on a stable chair  without armrests, or stand. 2. Hold an exercise band in both hands. 3. Keeping your upper arms at your side, bring both hands up to your shoulder. Keep your left / right hand just below your other hand. 4. Straighten your left / right elbow while keeping your other arm still. 5. Hold for 3 seconds. 6. Slowly bend your elbow to return to the starting position. Repeat 2 times. Complete this exercise 3 times per week. Exercise O: Supination  1. Sit with the your left / right forearm supported on a table. Your elbow should be at waist height. 2. Rest your hand over the edge of the table, palm-down. 3. Gently grasp a lightweight hammer. 4. Without moving your left / right elbow, slowly rotate your palm up, to a "thumbs-up" position. 5. Hold for 3 seconds. 6. Slowly return to the starting position. Repeat 2 times. Complete this exercise 3 times per weeky. Exercise P: Pronation  1. Sit with your left / right forearm supported on a table. Your elbow should be at waist height. 2. Rest your hand over the edge of the table, palm-up. 3. Gently grasp a lightweight hammer. 4. Without moving your left / right elbow, slowly rotate your palm down. 5. Hold for 3 seconds. 6. Slowly return to the starting position. Repeat 2 times. Complete this exercise 3 times per week. This information is not intended to replace advice given to you by your health care provider. Make sure you discuss any questions you have with your health care provider. Document Released: 01/10/2005 Document Revised: 09/17/2015 Document Reviewed: 10/05/2014 Elsevier Interactive Patient Education  [employer].

## 2020-04-20 NOTE — Progress Notes (Signed)
Chief Complaint  Patient presents with  . Follow-up    Subjective Tony Walters is a 45 y.o. male who presents for hypertension follow up. He does monitor home blood pressures. Blood pressures ranging from 130-140's/80's on average. He is compliant with medication- HCTZ 25 mg/d. Patient has these side effects of medication: none He is adhering to a healthy diet overall. Current exercise: active at home No CP or SOB   Past Medical History:  Diagnosis Date  . Chicken pox as a child  . Elevated BP 04/04/2013  . Grief reaction 04/04/2013  . HTN (hypertension) 04/04/2013  . PTSD (post-traumatic stress disorder)   . Unspecified hypothyroidism 04/07/2013    Exam BP (!) 152/90 (BP Location: Left Arm, Patient Position: Sitting, Cuff Size: Large)   Pulse 68   Temp 98.6 F (37 C) (Oral)   Ht 6\' 2"  (1.88 m)   Wt 291 lb 4 oz (132.1 kg)   SpO2 97%   BMI 37.39 kg/m  General:  well developed, well nourished, in no apparent distress Heart: RRR, no bruits, no LE edema Lungs: clear to auscultation, no accessory muscle use MSK: +TTP over the R brachioradialis. +Pain w resisted wrist supination. No edema or deformity Psych: well oriented with normal range of affect and appropriate judgment/insight  Essential hypertension - Plan: olmesartan-hydrochlorothiazide (BENICAR HCT) 40-25 MG tablet, Basic metabolic panel  Strain of right biceps, initial encounter  Snoring - Plan: Ambulatory referral to Pulmonology  1.  Chronic, uncontrolled.  Continue hydrochlorothiazide 25 mg a day, add olmesartan in the form of a combination pill.  Continue to monitor blood pressure at home.  Counseled on diet and exercise. 2.   Home stretches and exercises, heat, ice, Tylenol.  If no improvement, will refer to the sports medicine team. 3.  We will rule out sleep apnea. F/u in 1 week for labs to recheck a BMP, 1 month to recheck above. The patient voiced understanding and agreement to the plan.  [place], DO 04/20/20  12:06 PM

## 2020-04-27 ENCOUNTER — Other Ambulatory Visit (INDEPENDENT_AMBULATORY_CARE_PROVIDER_SITE_OTHER): Payer: 59

## 2020-04-27 ENCOUNTER — Other Ambulatory Visit: Payer: Self-pay

## 2020-04-27 ENCOUNTER — Other Ambulatory Visit: Payer: Self-pay | Admitting: Family Medicine

## 2020-04-27 DIAGNOSIS — I10 Essential (primary) hypertension: Secondary | ICD-10-CM | POA: Diagnosis not present

## 2020-04-27 DIAGNOSIS — R7989 Other specified abnormal findings of blood chemistry: Secondary | ICD-10-CM | POA: Diagnosis not present

## 2020-04-27 LAB — BASIC METABOLIC PANEL
BUN: 18 mg/dL (ref 6–23)
CO2: 31 mEq/L (ref 19–32)
Calcium: 10.2 mg/dL (ref 8.4–10.5)
Chloride: 100 mEq/L (ref 96–112)
Creatinine, Ser: 1.19 mg/dL (ref 0.40–1.50)
GFR: 73.28 mL/min (ref >60.00)
Glucose, Bld: 116 mg/dL — ABNORMAL HIGH (ref 70–99)
Potassium: 4.3 mEq/L (ref 3.5–5.1)
Sodium: 140 mEq/L (ref 135–145)

## 2020-04-27 LAB — HEPATIC FUNCTION PANEL
ALT: 108 U/L — ABNORMAL HIGH (ref 0–53)
AST: 57 U/L — ABNORMAL HIGH (ref 0–37)
Albumin: 4.7 g/dL (ref 3.5–5.2)
Alkaline Phosphatase: 84 U/L (ref 39–117)
Bilirubin, Direct: 0.1 mg/dL (ref 0.0–0.3)
Total Bilirubin: 0.5 mg/dL (ref 0.2–1.2)
Total Protein: 7.8 g/dL (ref 6.0–8.3)

## 2020-05-06 ENCOUNTER — Other Ambulatory Visit: Payer: Self-pay | Admitting: Family Medicine

## 2020-05-06 ENCOUNTER — Ambulatory Visit
Admission: RE | Admit: 2020-05-06 | Discharge: 2020-05-06 | Disposition: A | Discharge status: Home / Self Care | Payer: 59 | Source: Ambulatory Visit | Attending: Family Medicine | Admitting: Family Medicine

## 2020-05-06 ENCOUNTER — Other Ambulatory Visit: Payer: Self-pay

## 2020-05-06 ENCOUNTER — Ambulatory Visit: Payer: 59

## 2020-05-06 DIAGNOSIS — N6452 Nipple discharge: Secondary | ICD-10-CM

## 2020-05-08 ENCOUNTER — Ambulatory Visit (HOSPITAL_BASED_OUTPATIENT_CLINIC_OR_DEPARTMENT_OTHER)
Admission: RE | Admit: 2020-05-08 | Discharge: 2020-05-08 | Disposition: A | Discharge status: Home / Self Care | Payer: 59 | Source: Ambulatory Visit | Attending: Family Medicine | Admitting: Family Medicine

## 2020-05-08 ENCOUNTER — Other Ambulatory Visit: Payer: Self-pay

## 2020-05-08 DIAGNOSIS — R7989 Other specified abnormal findings of blood chemistry: Secondary | ICD-10-CM | POA: Diagnosis present

## 2020-05-11 ENCOUNTER — Other Ambulatory Visit: Payer: Self-pay | Admitting: Family Medicine

## 2020-05-11 DIAGNOSIS — R932 Abnormal findings on diagnostic imaging of liver and biliary tract: Secondary | ICD-10-CM

## 2020-05-22 ENCOUNTER — Other Ambulatory Visit: Payer: Self-pay

## 2020-05-22 ENCOUNTER — Other Ambulatory Visit (HOSPITAL_BASED_OUTPATIENT_CLINIC_OR_DEPARTMENT_OTHER): Payer: Self-pay

## 2020-05-22 ENCOUNTER — Telehealth: Payer: Self-pay | Admitting: *Deleted

## 2020-05-22 ENCOUNTER — Ambulatory Visit (AMBULATORY_SURGERY_CENTER): Payer: Self-pay | Admitting: *Deleted

## 2020-05-22 VITALS — Ht 74.0 in | Wt 283.6 lb

## 2020-05-22 DIAGNOSIS — Z1211 Encounter for screening for malignant neoplasm of colon: Secondary | ICD-10-CM

## 2020-05-22 MED ORDER — MOVIPREP 100 G PO SOLR
1.0000 | Freq: Once | ORAL | 0 refills | Status: AC
Start: 2020-05-22 — End: 2020-05-26
  Filled 2020-05-22: qty 1, 1d supply, fill #0

## 2020-05-22 NOTE — Telephone Encounter (Signed)
Opened in error

## 2020-05-22 NOTE — Progress Notes (Signed)
  No trouble with anesthesia, denies being told they were difficult to intubate, or hx/fam hx of malignant hyperthermia per pt   No egg or soy allergy  No home oxygen use   No medications for weight loss taken  Pt denies constipation issues  Pt informed that we do not do prior authorizations for prep  

## 2020-05-25 ENCOUNTER — Ambulatory Visit (INDEPENDENT_AMBULATORY_CARE_PROVIDER_SITE_OTHER): Payer: 59 | Admitting: Family Medicine

## 2020-05-25 ENCOUNTER — Encounter: Payer: Self-pay | Admitting: Family Medicine

## 2020-05-25 ENCOUNTER — Other Ambulatory Visit: Payer: Self-pay

## 2020-05-25 ENCOUNTER — Other Ambulatory Visit (HOSPITAL_BASED_OUTPATIENT_CLINIC_OR_DEPARTMENT_OTHER): Payer: Self-pay

## 2020-05-25 VITALS — BP 122/82 | HR 79 | Temp 98.0°F | Ht 72.0 in | Wt 287.4 lb

## 2020-05-25 DIAGNOSIS — I10 Essential (primary) hypertension: Secondary | ICD-10-CM | POA: Diagnosis not present

## 2020-05-25 DIAGNOSIS — F524 Premature ejaculation: Secondary | ICD-10-CM

## 2020-05-25 MED ORDER — CITALOPRAM HYDROBROMIDE 40 MG PO TABS
40.0000 mg | ORAL_TABLET | Freq: Every day | ORAL | 3 refills | Status: DC
  Filled 2020-05-25: qty 30, 30d supply, fill #0

## 2020-05-25 MED ORDER — OLMESARTAN MEDOXOMIL-HCTZ 40-25 MG PO TABS
1.0000 | ORAL_TABLET | Freq: Every day | ORAL | 2 refills | Status: DC
  Filled 2020-05-25: qty 90, 90d supply, fill #0

## 2020-05-25 NOTE — Progress Notes (Signed)
Chief Complaint  Patient presents with  . Follow-up    Subjective Tony Walters is a 45 y.o. male who presents for hypertension follow up.  He is here with his wife. He does not routinely monitor home blood pressures. He is compliant with medications- Benicar HCT 40-25 mg/d. Patient has these side effects of medication: none He is not always adhering to a healthy diet overall. Current exercise: none No CP or SOB.   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  At lower doses, he felt it was working better and then efficacy tapered off.  He was not having any other side effects from the medication.  He has never tried anything else.   Past Medical History:  Diagnosis Date  . Chicken pox as a child  . Grief reaction 04/04/2013  . HTN (hypertension) 04/04/2013  . PTSD (post-traumatic stress disorder)   . Unspecified hypothyroidism 04/07/2013    Exam BP 122/82 (BP Location: Right Arm, Patient Position: Sitting, Cuff Size: Normal)   Pulse 79   Temp 98 F (36.7 C) (Oral)   Ht 6' (1.829 m)   Wt 287 lb 6 oz (130.4 kg)   SpO2 96%   BMI 38.98 kg/m  General:  well developed, well nourished, in no apparent distress Heart: RRR, no bruits, no LE edema Lungs: clear to auscultation, no accessory muscle use Psych: well oriented with normal range of affect and appropriate judgment/insight  Essential hypertension - Plan: olmesartan-hydrochlorothiazide (BENICAR HCT) 40-25 MG tablet  Premature ejaculation  1.  Continue olmesartan-hydrochlorothiazide 40-25 mg daily.  Counseled on diet and exercise. 2.  We will change Paxil to Celexa 40 mg daily.  I would see him in 6 to 8 weeks if no improvement but he does improve, I will see him in 6 months for a physical. The patient and his spouse voiced understanding and agreement to the plan.  [place], DO 05/25/20  11:13 AM

## 2020-05-25 NOTE — Patient Instructions (Signed)
Call [phone] regarding your liver.  Keep the diet clean and stay active.  OK to check blood pressure intermittently.   Let us know if you need anything.

## 2020-05-28 ENCOUNTER — Other Ambulatory Visit: Payer: Self-pay

## 2020-05-28 ENCOUNTER — Ambulatory Visit (INDEPENDENT_AMBULATORY_CARE_PROVIDER_SITE_OTHER): Payer: 59 | Admitting: Podiatry

## 2020-05-28 DIAGNOSIS — L309 Dermatitis, unspecified: Secondary | ICD-10-CM

## 2020-05-28 NOTE — Progress Notes (Signed)
He presents today for follow-up of his surgery check in his Lamisil.  States that have been taking Lamisil but it really has not changed the itching.  He goes on to say that time he still unable to squat down and to go up on his toes.  Objective: Vital signs are stable he is alert oriented x3 he appears to be improving very much with a better range of motion of the first metatarsophalangeal joint no erythema edema cellulitis drainage or odor.  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  Assessment: Probable eczema referring him to dermatology.  Continue to chronic pain and discomfort with particular movements and motions from his surgery right foot.  Plan: Refer to dermatology.  I will follow-up with him in 1 month he cannot return to work until he is able to squat and go up on his toes.

## 2020-06-05 ENCOUNTER — Other Ambulatory Visit: Payer: Self-pay

## 2020-06-05 ENCOUNTER — Ambulatory Visit (AMBULATORY_SURGERY_CENTER): Payer: 59 | Admitting: Gastroenterology

## 2020-06-05 ENCOUNTER — Encounter: Payer: Self-pay | Admitting: Gastroenterology

## 2020-06-05 VITALS — BP 110/60 | HR 77 | Temp 98.7°F | Resp 24 | Ht 74.0 in | Wt 283.6 lb

## 2020-06-05 DIAGNOSIS — Z1211 Encounter for screening for malignant neoplasm of colon: Secondary | ICD-10-CM

## 2020-06-05 DIAGNOSIS — K5730 Diverticulosis of large intestine without perforation or abscess without bleeding: Secondary | ICD-10-CM

## 2020-06-05 DIAGNOSIS — K640 First degree hemorrhoids: Secondary | ICD-10-CM

## 2020-06-05 DIAGNOSIS — D128 Benign neoplasm of rectum: Secondary | ICD-10-CM

## 2020-06-05 DIAGNOSIS — K621 Rectal polyp: Secondary | ICD-10-CM | POA: Diagnosis not present

## 2020-06-05 DIAGNOSIS — D129 Benign neoplasm of anus and anal canal: Secondary | ICD-10-CM

## 2020-06-05 MED ORDER — SODIUM CHLORIDE 0.9 % IV SOLN: 500.0000 mL | Freq: Once | INTRAVENOUS | Status: DC

## 2020-06-05 NOTE — Patient Instructions (Addendum)
Thank you for allowing [place] to care for you today!  Await final biopsy results, approximately 7-10 days by mail.  Will make recommendation at that time for a future colonoscopy.  Resume previous diet and medications today.  Return to your normal daily activities tomorrow.  Recommend adding fiber supplement, for example Citrucel, Fibercon, Konsyl, or Metamucil.    YOU HAD AN ENDOSCOPIC PROCEDURE TODAY AT [facility]:   Refer to the procedure report that was given to you for any specific questions about what was found during the examination.  If the procedure report does not answer your questions, please call your gastroenterologist to clarify.  If you requested that your care partner not be given the details of your procedure findings, then the procedure report has been included in a sealed envelope for you to review at your convenience later.  YOU SHOULD EXPECT: Some feelings of bloating in the abdomen. Passage of more gas than usual.  Walking can help get rid of the air that was put into your GI tract during the procedure and reduce the bloating. If you had a lower endoscopy (such as a colonoscopy or flexible sigmoidoscopy) you may notice spotting of blood in your stool or on the toilet paper. If you underwent a bowel prep for your procedure, you may not have a normal bowel movement for a few days.  Please Note:  You might notice some irritation and congestion in your nose or some drainage.  This is from the oxygen used during your procedure.  There is no need for concern and it should clear up in a day or so.  SYMPTOMS TO REPORT IMMEDIATELY:   Following lower endoscopy (colonoscopy or flexible sigmoidoscopy):  Excessive amounts of blood in the stool  Significant tenderness or worsening of abdominal pains  Swelling of the abdomen that is new, acute  Fever of 100F or higher   For urgent or emergent issues, a gastroenterologist can be reached at any hour by calling [phone]  [phone]. Do not use MyChart messaging for urgent concerns.    DIET:  We do recommend a small meal at first, but then you may proceed to your regular diet.  Drink plenty of fluids but you should avoid alcoholic beverages for 24 hours.  ACTIVITY:  You should plan to take it easy for the rest of today and you should NOT DRIVE or use heavy machinery until tomorrow (because of the sedation medicines used during the test).    FOLLOW UP: Our staff will call the number listed on your records 48-72 hours following your procedure to check on you and address any questions or concerns that you may have regarding the information given to you following your procedure. If we do not reach you, we will leave a message.  We will attempt to reach you two times.  During this call, we will ask if you have developed any symptoms of COVID 19. If you develop any symptoms (ie: fever, flu-like symptoms, shortness of breath, cough etc.) before then, please call [phone].  If you test positive for Covid 19 in the 2 weeks post procedure, please call and report this information to [place].    If any biopsies were taken you will be contacted by phone or by letter within the next 1-3 weeks.  Please call us at [phone] if you have not heard about the biopsies in 3 weeks.    SIGNATURES/CONFIDENTIALITY: You and/or your care partner have signed paperwork which will be entered into  your electronic medical record.  These signatures attest to the fact that that the information above on your After Visit Summary has been reviewed and is understood.  Full responsibility of the confidentiality of this discharge information lies with you and/or your care-partner.YOU HAD AN ENDOSCOPIC PROCEDURE TODAY AT [facility]:   Refer to the procedure report that was given to you for any specific questions about what was found during the examination.  If the procedure report does not answer your questions, please call your  gastroenterologist to clarify.  If you requested that your care partner not be given the details of your procedure findings, then the procedure report has been included in a sealed envelope for you to review at your convenience later.  YOU SHOULD EXPECT: Some feelings of bloating in the abdomen. Passage of more gas than usual.  Walking can help get rid of the air that was put into your GI tract during the procedure and reduce the bloating. If you had a lower endoscopy (such as a colonoscopy or flexible sigmoidoscopy) you may notice spotting of blood in your stool or on the toilet paper. If you underwent a bowel prep for your procedure, you may not have a normal bowel movement for a few days.  Please Note:  You might notice some irritation and congestion in your nose or some drainage.  This is from the oxygen used during your procedure.  There is no need for concern and it should clear up in a day or so.  SYMPTOMS TO REPORT IMMEDIATELY:   Following lower endoscopy (colonoscopy or flexible sigmoidoscopy):  Excessive amounts of blood in the stool  Significant tenderness or worsening of abdominal pains  Swelling of the abdomen that is new, acute  Fever of 100F or higher   For urgent or emergent issues, a gastroenterologist can be reached at any hour by calling [phone]. Do not use MyChart messaging for urgent concerns.    DIET:  We do recommend a small meal at first, but then you may proceed to your regular diet.  Drink plenty of fluids but you should avoid alcoholic beverages for 24 hours.  ACTIVITY:  You should plan to take it easy for the rest of today and you should NOT DRIVE or use heavy machinery until tomorrow (because of the sedation medicines used during the test).    FOLLOW UP: Our staff will call the number listed on your records 48-72 hours following your procedure to check on you and address any questions or concerns that you may have regarding the information given to you  following your procedure. If we do not reach you, we will leave a message.  We will attempt to reach you two times.  During this call, we will ask if you have developed any symptoms of COVID 19. If you develop any symptoms (ie: fever, flu-like symptoms, shortness of breath, cough etc.) before then, please call [phone].  If you test positive for Covid 19 in the 2 weeks post procedure, please call and report this information to [place].    If any biopsies were taken you will be contacted by phone or by letter within the next 1-3 weeks.  Please call us at [phone] if you have not heard about the biopsies in 3 weeks.    SIGNATURES/CONFIDENTIALITY: You and/or your care partner have signed paperwork which will be entered into your electronic medical record.  These signatures attest to the fact that that the information above on your  After Visit Summary has been reviewed and is understood.  Full responsibility of the confidentiality of this discharge information lies with you and/or your care-partner.

## 2020-06-05 NOTE — Progress Notes (Signed)
A/ox3, pleased with MAC, report to RN 

## 2020-06-05 NOTE — Progress Notes (Signed)
Called to room to assist during endoscopic procedure.  Patient ID and intended procedure confirmed with present staff. Received instructions for my participation in the procedure from the performing physician.  

## 2020-06-05 NOTE — Op Note (Signed)
[facility] Patient Name: Tony Walters Procedure Date: 06/05/2020 8:48 AM MRN: [id number] Endoscopist: Gerrit Heck , MD Age: 45 Referring MD:  Date of Birth: 29-Oct-1975 Gender: Male Account #: [id number] Procedure:                Colonoscopy Indications:              Screening for colorectal malignant neoplasm, This                            is the patient's first colonoscopy Medicines:                Monitored Anesthesia Care Procedure:                Pre-Anesthesia Assessment:                           - Prior to the procedure, a History and Physical                            was performed, and patient medications and                            allergies were reviewed. The patient's tolerance of                            previous anesthesia was also reviewed. The risks                            and benefits of the procedure and the sedation                            options and risks were discussed with the patient.                            All questions were answered, and informed consent                            was obtained. Prior Anticoagulants: The patient has                            taken no previous anticoagulant or antiplatelet                            agents. ASA Grade Assessment: II - A patient with                            mild systemic disease. After reviewing the risks                            and benefits, the patient was deemed in                            satisfactory condition to undergo the procedure.  After obtaining informed consent, the colonoscope                            was passed under direct vision. Throughout the                            procedure, the patient's blood pressure, pulse, and                            oxygen saturations were monitored continuously. The                            Olympus CF-[id number] was introduced                            through the anus and  advanced to the the cecum,                            identified by appendiceal orifice and ileocecal                            valve. The colonoscopy was performed without                            difficulty. The patient tolerated the procedure                            well. The quality of the bowel preparation was                            good. The ileocecal valve, appendiceal orifice, and                            rectum were photographed. Scope In: 9:08:06 AM Scope Out: 9:32:12 AM Scope Withdrawal Time: 0 hours 14 minutes 54 seconds  Total Procedure Duration: 0 hours 24 minutes 6 seconds  Findings:                 The perianal and digital rectal examinations were                            normal.                           A 3 mm polyp was found in the rectum. The polyp was                            sessile. The polyp was removed with a cold snare.                            Resection and retrieval were complete. Estimated                            blood loss was minimal.  A single medium-mouthed diverticulum was found in                            the transverse colon.                           Non-bleeding internal hemorrhoids were found during                            retroflexion. The hemorrhoids were small.                           The ascending colon revealed moderately excessive                            looping. Advancing the scope required using manual                            pressure. The remainder of the colon was otherwise                            normal appearing. Complications:            No immediate complications. Estimated Blood Loss:     Estimated blood loss was minimal. Impression:               - One 3 mm polyp in the rectum, removed with a cold                            snare. Resected and retrieved.                           - Diverticulosis in the transverse colon.                           - Non-bleeding internal  hemorrhoids.                           - There was significant looping of the colon. Recommendation:           - Patient has a contact number available for                            emergencies. The signs and symptoms of potential                            delayed complications were discussed with the                            patient. Return to normal activities tomorrow.                            Written discharge instructions were provided to the                            patient.                           -  Resume previous diet.                           - Continue present medications.                           - Await pathology results.                           - Repeat colonoscopy in 5-10 years for surveillance                            based on pathology results.                           - Return to GI office PRN.                           - Use fiber, for example Citrucel, Fibercon, Konsyl                            or Metamucil. Gerrit Heck, MD 06/05/2020 9:37:43 AM

## 2020-06-05 NOTE — Progress Notes (Signed)
Vital signs checked by:CW  The patient states no changes in medical or surgical history since pre-visit screening on 05/22/20.   

## 2020-06-09 ENCOUNTER — Telehealth: Payer: Self-pay | Admitting: *Deleted

## 2020-06-09 NOTE — Telephone Encounter (Signed)
  Follow up Call-  Call back number 06/05/2020  Post procedure Call Back phone  # [phone]  Permission to leave phone message Yes  Some recent data might be hidden     Patient questions:  Do you have a fever, pain , or abdominal swelling? No. Pain Score  0 *  Have you tolerated food without any problems? yes  Have you been able to return to your normal activities? Yes.    Do you have any questions about your discharge instructions: Diet   No. Medications  No. Follow up visit  No.  Do you have questions or concerns about your Care? No.  Actions: * If pain score is 4 or above: No action needed, pain <4.  1. Have you developed a fever since your procedure? no  2.   Have you had an respiratory symptoms (SOB or cough) since your procedure? no  3.   Have you tested positive for COVID 19 since your procedure no  4.   Have you had any family members/close contacts diagnosed with the COVID 19 since your procedure?  no   If yes to any of these questions please route to [name], RN and [name], RN

## 2020-06-18 ENCOUNTER — Encounter: Payer: Self-pay | Admitting: Gastroenterology

## 2020-07-30 ENCOUNTER — Ambulatory Visit (INDEPENDENT_AMBULATORY_CARE_PROVIDER_SITE_OTHER): Payer: 59 | Admitting: Podiatry

## 2020-07-30 ENCOUNTER — Other Ambulatory Visit: Payer: Self-pay

## 2020-07-30 ENCOUNTER — Encounter: Payer: Self-pay | Admitting: Podiatry

## 2020-07-30 ENCOUNTER — Other Ambulatory Visit (HOSPITAL_BASED_OUTPATIENT_CLINIC_OR_DEPARTMENT_OTHER): Payer: Self-pay

## 2020-07-30 DIAGNOSIS — L309 Dermatitis, unspecified: Secondary | ICD-10-CM

## 2020-07-30 DIAGNOSIS — B353 Tinea pedis: Secondary | ICD-10-CM

## 2020-07-30 MED ORDER — CLOBETASOL PROPIONATE 0.05 % EX CREA
1.0000 "application " | TOPICAL_CREAM | Freq: Two times a day (BID) | CUTANEOUS | 0 refills | Status: DC
  Filled 2020-07-30: qty 30, 30d supply, fill #0

## 2020-07-31 NOTE — Progress Notes (Signed)
He presents today for follow-up of his eczema states that he still itches some but all in all it seems to be doing a little bit better.  He states when he does it it feels like I could not take a hole through my foot.  Objective: Vital signs are stable he is alert and oriented x3.  Pulses are palpable neurologic sensorium is intact Deetjen reflexes are intact muscle strength is normal symmetrical.  Evaluation of the scar from excision of plantar fibroma plantar aspect of the foot demonstrates well-healed fibroma incision.  It does appear that there is a small amount of eczema possibly associated with the surgery itself I do not see signs of bacterial or fungal infection at this point.  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  Plan: At this point start him on clobetasol twice a day and I will follow-up with him in a month or so.  If not improved consider punch biopsy.

## 2020-09-10 ENCOUNTER — Ambulatory Visit: Payer: Self-pay | Admitting: Podiatry

## 2020-09-22 ENCOUNTER — Other Ambulatory Visit: Payer: Self-pay

## 2020-09-22 ENCOUNTER — Encounter: Payer: Self-pay | Admitting: Podiatry

## 2020-09-22 ENCOUNTER — Ambulatory Visit (INDEPENDENT_AMBULATORY_CARE_PROVIDER_SITE_OTHER): Payer: BLUE CROSS/BLUE SHIELD | Admitting: Podiatry

## 2020-09-22 DIAGNOSIS — L309 Dermatitis, unspecified: Secondary | ICD-10-CM

## 2020-09-22 NOTE — Progress Notes (Signed)
He presents today for follow-up of his surgical foot right.  He states that the clobetasol has made the itching stopped but the blister still come to the surface.  He states that if I stand in 1 place for long the blisters will get larger and they will start to itch.  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  Objective: Vital signs are stable he is alert oriented x3 right foot does demonstrate what appears to be small vesicular lesions that have a clear fluid that have erupted and scabbed over on the plantar aspect of his foot near his incision.  The incision is going to heal very well however these blisters continue to come.  This appears to be a dyshidrotic eczema however I think it would behoove [place] to have a second opinion from dermatology.  Assessment: Well-healing surgical foot eczema nummular plantar aspect right.  Plan: I recommended that he continue to use the clobetasol once a day or once every other day and we will have him scheduled for a visit with [facility] dermatology.

## 2020-10-12 ENCOUNTER — Other Ambulatory Visit (HOSPITAL_BASED_OUTPATIENT_CLINIC_OR_DEPARTMENT_OTHER): Payer: Self-pay

## 2020-10-14 IMAGING — DX DG HAND COMPLETE 3+V*L*
3 series · 3 of 3 positions shown · non-contrast
Comparison: None.

CLINICAL DATA: Punching injury with fourth and fifth metacarpal
pain.

EXAM:
LEFT HAND - COMPLETE 3+ VIEW

[hand pa]
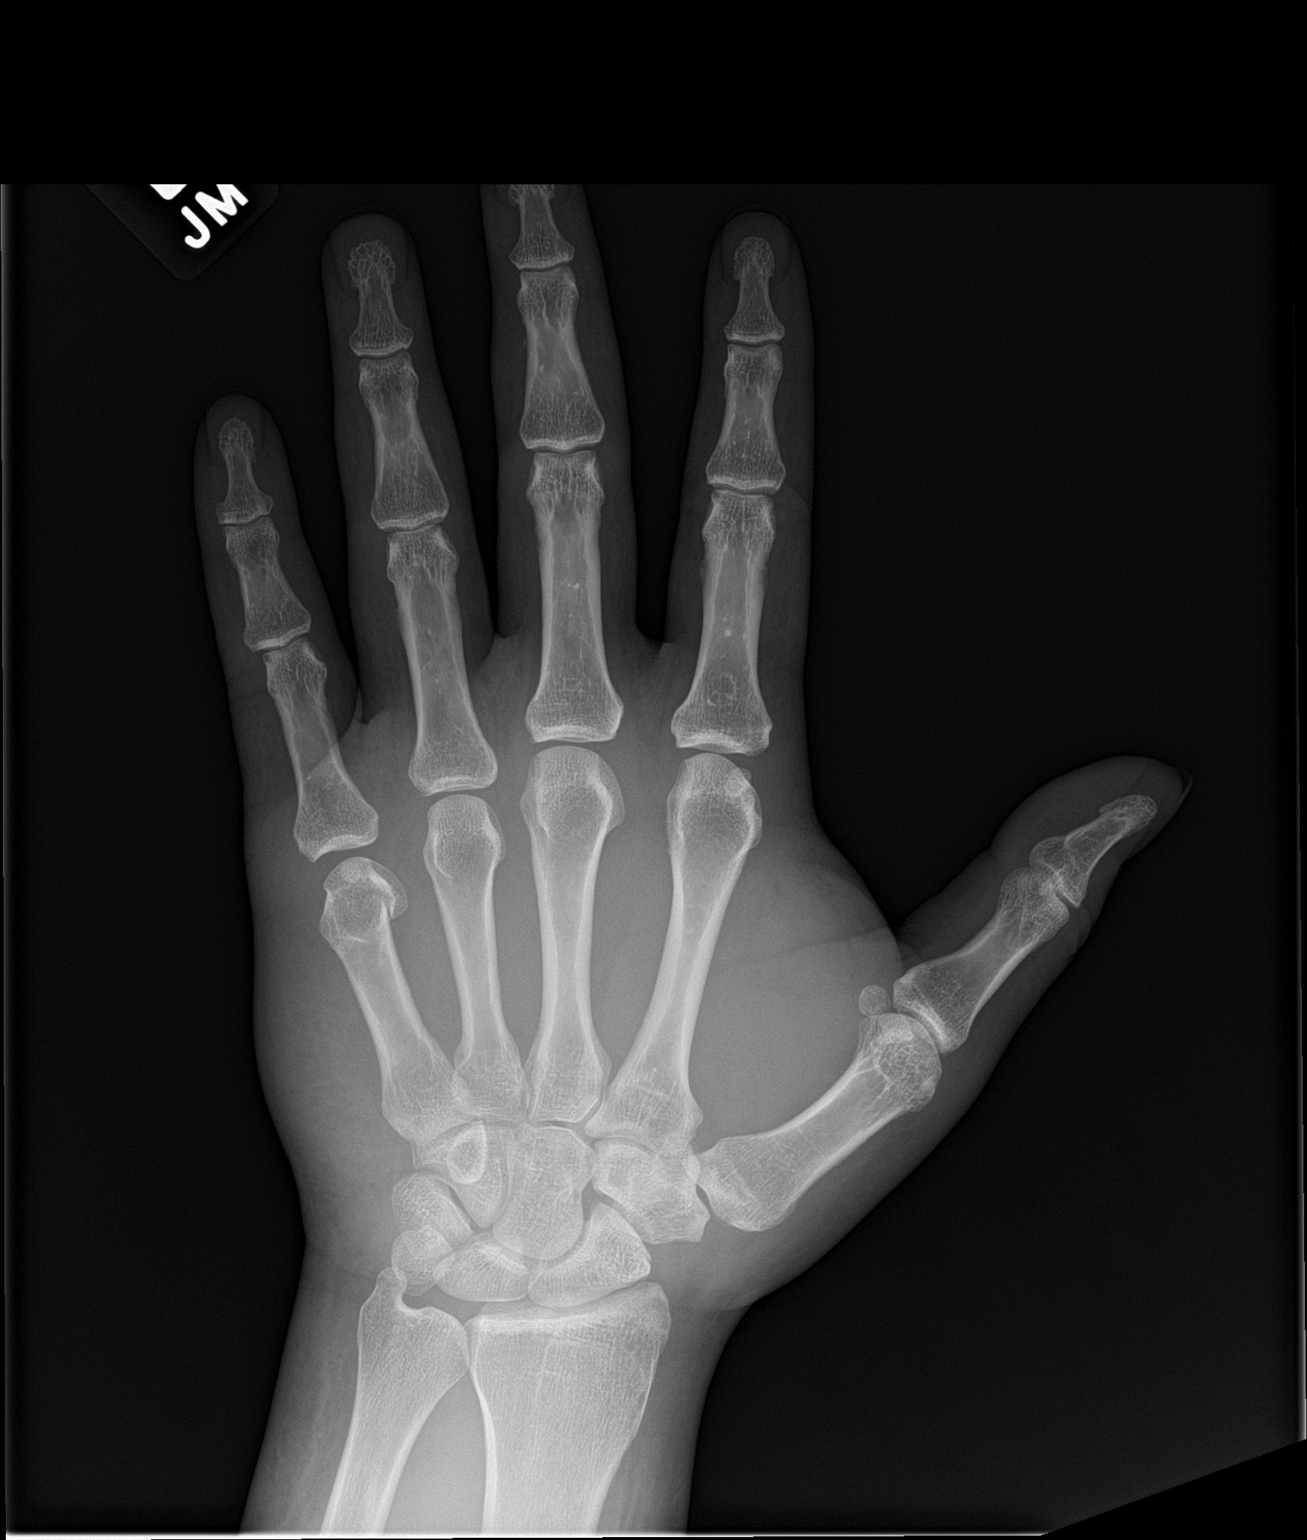

[hand obl]
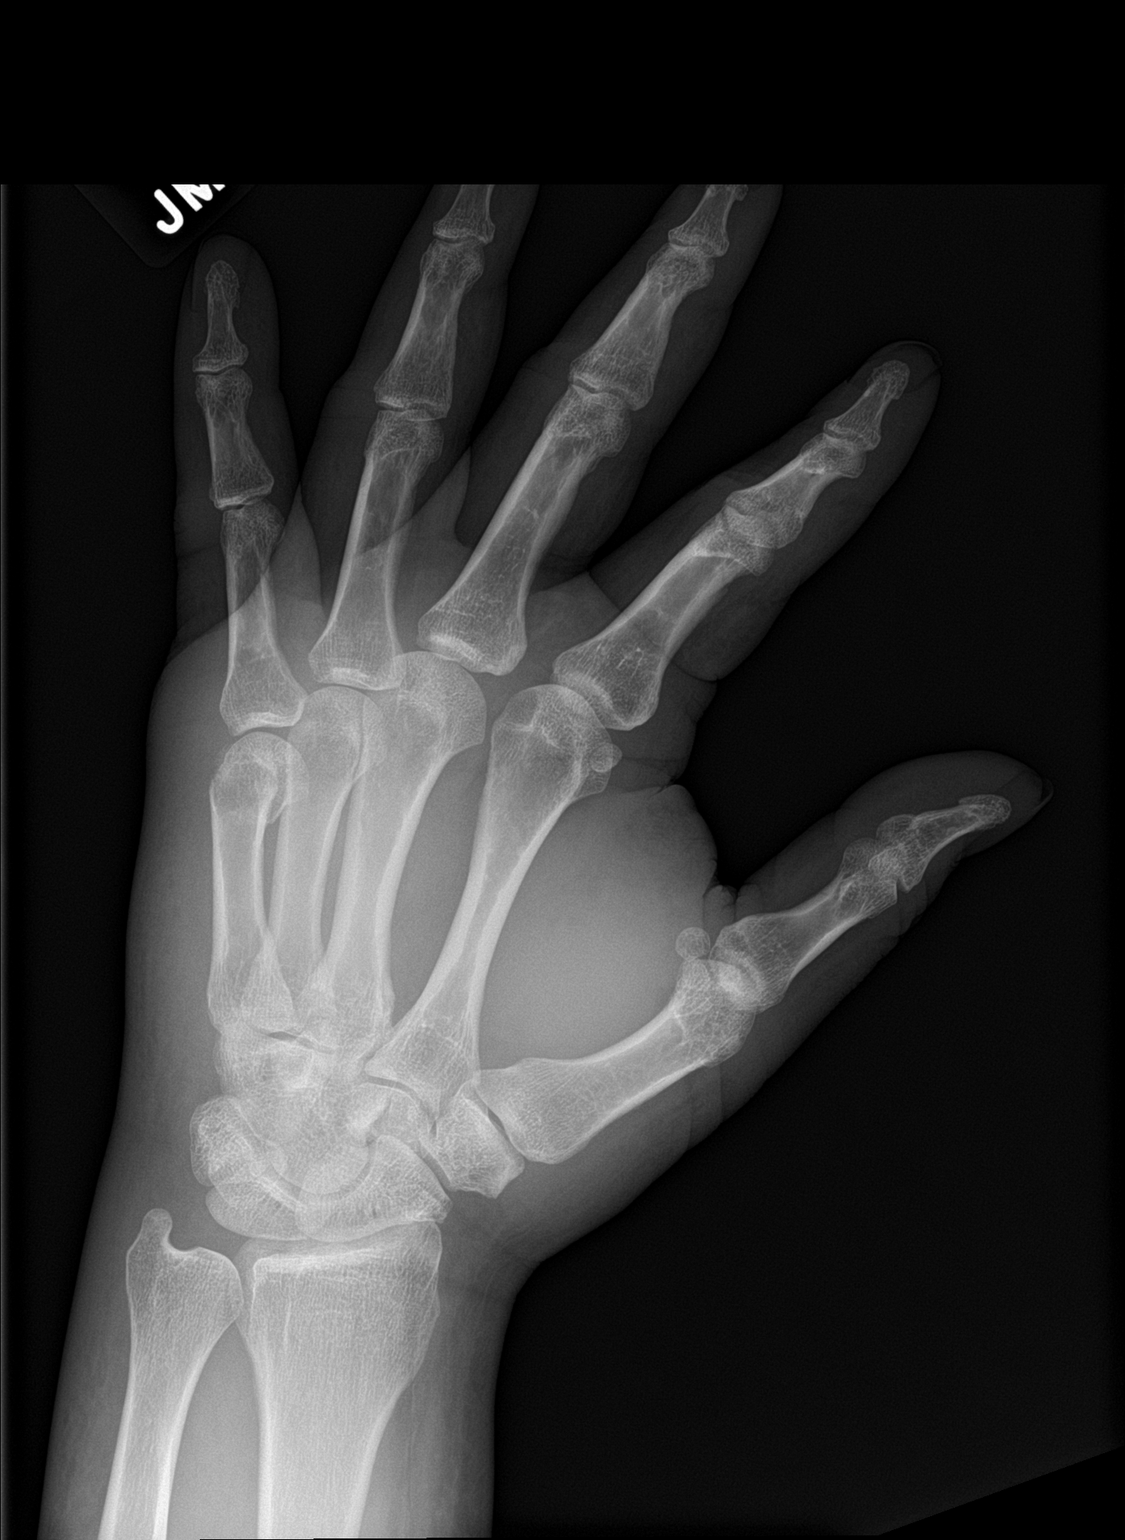

[hand lat]
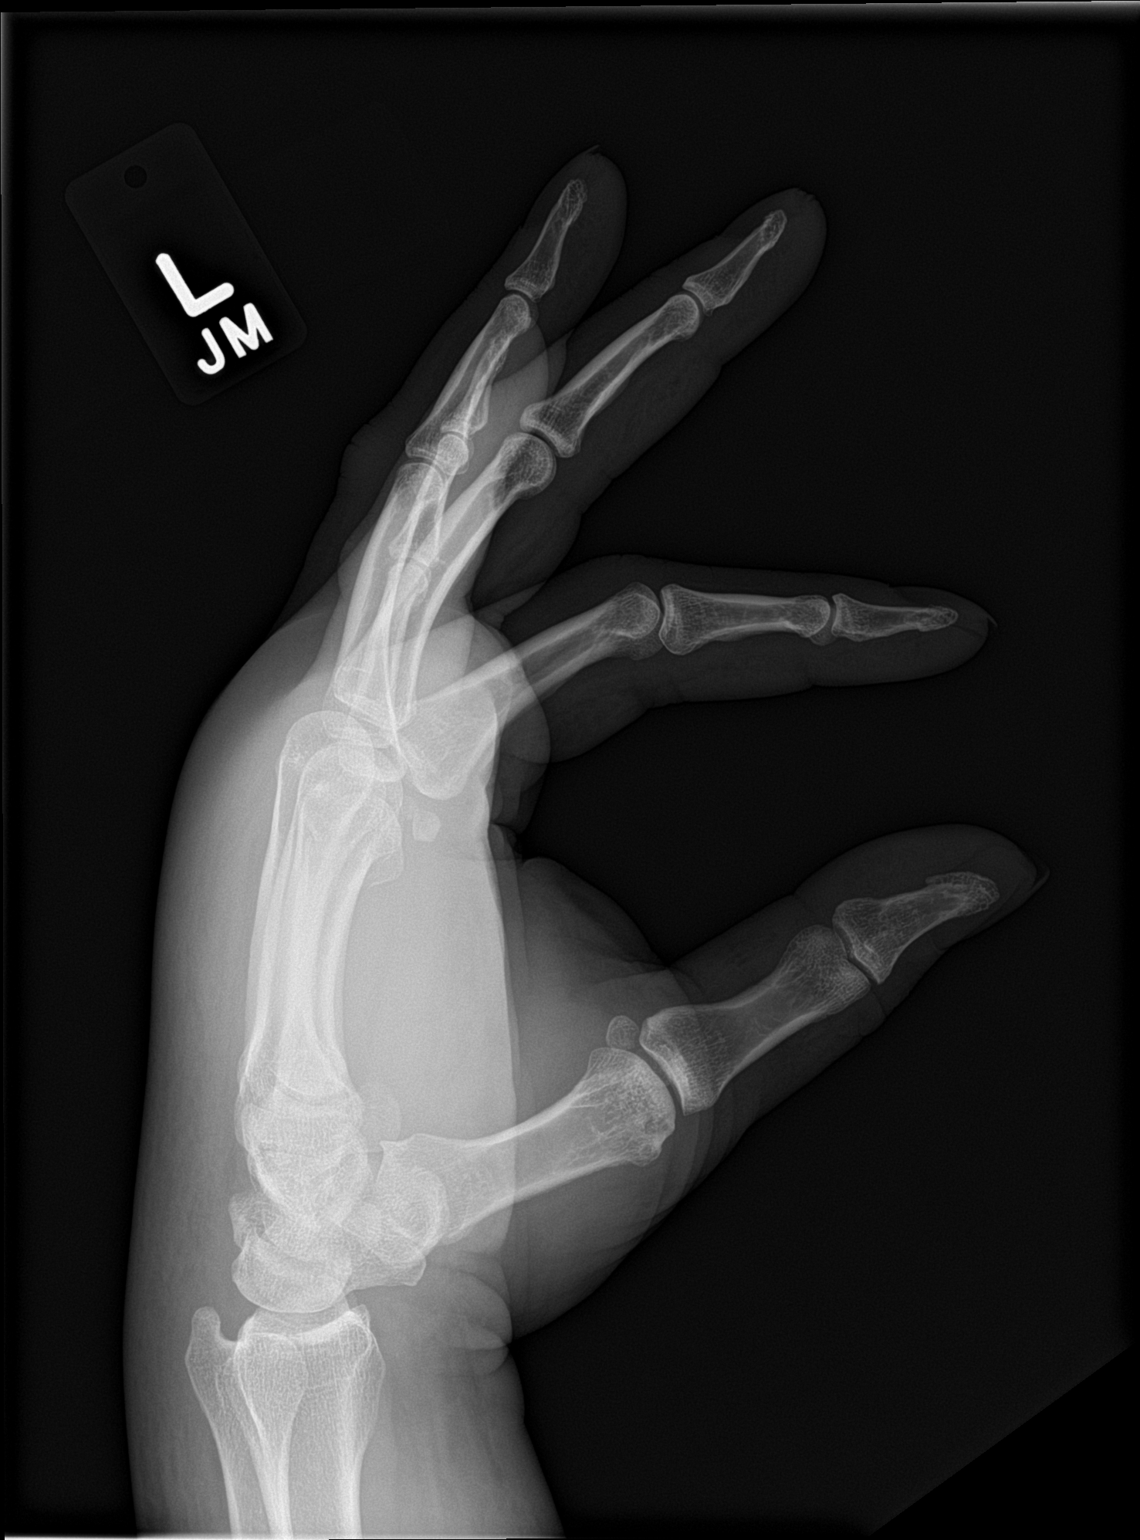

[3 of 3 positions shown; findings below may reference images not displayed]

FINDINGS: There is a boxer's type fracture of the distal fifth metacarpal with
dorsal angulation. No other regional bone or joint finding. Regional
soft tissue swelling.
IMPRESSION: Boxer's type fracture of the distal fifth metacarpal.

## 2020-11-25 ENCOUNTER — Ambulatory Visit (INDEPENDENT_AMBULATORY_CARE_PROVIDER_SITE_OTHER): Payer: BLUE CROSS/BLUE SHIELD | Admitting: Family Medicine

## 2020-11-25 ENCOUNTER — Other Ambulatory Visit: Payer: Self-pay

## 2020-11-25 ENCOUNTER — Encounter: Payer: Self-pay | Admitting: Family Medicine

## 2020-11-25 VITALS — BP 138/86 | HR 89 | Temp 98.2°F | Ht 74.0 in | Wt 296.1 lb

## 2020-11-25 DIAGNOSIS — Z0000 Encounter for general adult medical examination without abnormal findings: Secondary | ICD-10-CM | POA: Diagnosis not present

## 2020-11-25 DIAGNOSIS — N529 Male erectile dysfunction, unspecified: Secondary | ICD-10-CM

## 2020-11-25 LAB — COMPREHENSIVE METABOLIC PANEL
ALT: 62 U/L — ABNORMAL HIGH (ref 0–53)
AST: 42 U/L — ABNORMAL HIGH (ref 0–37)
Albumin: 4.7 g/dL (ref 3.5–5.2)
Alkaline Phosphatase: 77 U/L (ref 39–117)
BUN: 13 mg/dL (ref 6–23)
CO2: 30 mEq/L (ref 19–32)
Calcium: 9.8 mg/dL (ref 8.4–10.5)
Chloride: 103 mEq/L (ref 96–112)
Creatinine, Ser: 1.31 mg/dL (ref 0.40–1.50)
GFR: 65.03 mL/min (ref >60.00)
Glucose, Bld: 98 mg/dL (ref 70–99)
Potassium: 4.2 mEq/L (ref 3.5–5.1)
Sodium: 140 mEq/L (ref 135–145)
Total Bilirubin: 0.3 mg/dL (ref 0.2–1.2)
Total Protein: 7.6 g/dL (ref 6.0–8.3)

## 2020-11-25 LAB — CBC
HCT: 45.5 % (ref 39.0–52.0)
Hemoglobin: 14.8 g/dL (ref 13.0–17.0)
MCHC: 32.6 g/dL (ref 30.0–36.0)
MCV: 85.8 fl (ref 78.0–100.0)
Platelets: 282 10*3/uL (ref 150.0–400.0)
RBC: 5.31 Mil/uL (ref 4.22–5.81)
RDW: 15.1 % (ref 11.5–15.5)
WBC: 10 10*3/uL (ref 4.0–10.5)

## 2020-11-25 LAB — LIPID PANEL
Cholesterol: 193 mg/dL (ref 0–200)
HDL: 47.2 mg/dL (ref >39.00)
NonHDL: 146.11
Total CHOL/HDL Ratio: 4
Triglycerides: 266 mg/dL — ABNORMAL HIGH (ref 0.0–149.0)
VLDL: 53.2 mg/dL — ABNORMAL HIGH (ref 0.0–40.0)

## 2020-11-25 LAB — LDL CHOLESTEROL, DIRECT: Direct LDL: 98 mg/dL

## 2020-11-25 MED ORDER — SILDENAFIL CITRATE 100 MG PO TABS: 50.0000 mg | ORAL_TABLET | Freq: Every day | ORAL | 2 refills | Status: DC | PRN

## 2020-11-25 NOTE — Progress Notes (Signed)
Chief Complaint  Patient presents with   Annual Exam    Well Male Tony Walters is here for a complete physical.   His last physical was >1 year ago.  Current diet: in general, diet had been better.   Current exercise: none Weight trend: increased Fatigue out of ordinary? No. Seat belt? Yes.    Health maintenance Tetanus- Yes HIV- Yes Hep C- Yes  Has had issues maintaining an erection over the past 6-7 mo. Sometimes will have issues attaining one. Desire still there.  Past Medical History:  Diagnosis Date   Chicken pox as a child   Grief reaction 04/04/2013   HTN (hypertension) 04/04/2013   PTSD (post-traumatic stress disorder)    Unspecified hypothyroidism 04/07/2013     Past Surgical History:  Procedure Laterality Date   FOOT SURGERY     x4- plantar fibroma, screw in first metatarsal- both feet   HYDROCELE EXCISION / REPAIR  45 years old   [place] EXTRACTION  2010    Medications  Current Outpatient Medications on File Prior to Visit  Medication Sig Dispense Refill   celecoxib (CELEBREX) 200 MG capsule Take 1 capsule (200 mg total) by mouth 2 (two) times daily. 60 capsule 3   citalopram (CELEXA) 40 MG tablet Take 1 tablet (40 mg total) by mouth daily. 30 tablet 3   clobetasol cream (TEMOVATE) 0.86 % Apply 1 application topically 2 (two) times daily. 30 g 0   olmesartan-hydrochlorothiazide (BENICAR HCT) 40-25 MG tablet TAKE 1 TABLET BY MOUTH DAILY. 90 tablet 2   levothyroxine (SYNTHROID) 50 MCG tablet TAKE 1 TABLET (50 MCG TOTAL) BY MOUTH DAILY. 90 tablet 2   [DISCONTINUED] hydrochlorothiazide (HYDRODIURIL) 25 MG tablet Take 1 tablet (25 mg total) by mouth daily. 90 tablet 2   Allergies No Known Allergies  Family History Family History  Problem Relation Age of Onset   Congestive Heart Failure Father    Hyperlipidemia Father    Hypertension Father    Kidney disease Maternal Grandmother    Diabetes Maternal Grandmother        type 2   Stroke Maternal  Grandmother    Alcohol abuse Paternal Grandmother    Pneumonia Paternal Grandfather    Asthma Brother    Breast cancer Neg Hx    Colon cancer Neg Hx    Esophageal cancer Neg Hx    Stomach cancer Neg Hx    Rectal cancer Neg Hx     Review of Systems: Constitutional: no fevers or chills Eye:  no recent significant change in vision Ear/Nose/Mouth/Throat:  Ears:  no hearing loss Nose/Mouth/Throat:  no complaints of nasal congestion, no sore throat Cardiovascular:  no chest pain Respiratory:  no shortness of breath Gastrointestinal:  no abdominal pain, no change in bowel habits GU:  Male: +ED Musculoskeletal/Extremities: +chronic foot pain; otherwise no pain of the joints Integumentary (Skin/Breast):  no abnormal skin lesions reported Neurologic:  no headaches Endocrine: No unexpected weight changes Hematologic/Lymphatic:  no night sweats  Exam BP 138/86   Pulse 89   Temp 98.2 F (36.8 C) (Oral)   Ht 6\' 2"  (1.88 m)   Wt 296 lb 2 oz (134.3 kg)   SpO2 95%   BMI 38.02 kg/m  General:  well developed, well nourished, in no apparent distress Skin:  no significant moles, warts, or growths Head:  no masses, lesions, or tenderness Eyes:  pupils equal and round, sclera anicteric without injection Ears:  canals without lesions, TMs shiny without retraction, no  obvious effusion, no erythema Nose:  nares patent, septum midline, mucosa normal Throat/Pharynx:  lips and gingiva without lesion; tongue and uvula midline; non-inflamed pharynx; no exudates or postnasal drainage Neck: neck supple without adenopathy, thyromegaly, or masses Lungs:  clear to auscultation, breath sounds equal bilaterally, no respiratory distress Cardio:  regular rate and rhythm, no bruits, no LE edema Abdomen:  abdomen soft, nontender; bowel sounds normal; no masses or organomegaly Rectal: Deferred Musculoskeletal:  symmetrical muscle groups noted without atrophy or deformity Extremities:  no clubbing, cyanosis, or  edema, no deformities, no skin discoloration Neuro:  gait normal; deep tendon reflexes normal and symmetric Psych: well oriented with normal range of affect and appropriate judgment/insight  Assessment and Plan  Well adult exam - Plan: CBC, Comprehensive metabolic panel, Lipid panel  Erectile dysfunction, unspecified erectile dysfunction type - Plan: sildenafil (VIAGRA) 100 MG tablet   Well 45 y.o. male. Counseled on diet and exercise. Counseled on risks and benefits of prostate cancer screening with PSA. The patient agrees forego screening.  ED: Trial Viagra. Use GoodRx.  Other orders as above. Follow up in 6 mo pending the above workup. The patient voiced understanding and agreement to the plan.  [place], DO 11/25/20 8:35 AM

## 2020-11-25 NOTE — Patient Instructions (Addendum)
Give [place] 2-3 business days to get the results of your labs back.   Keep the diet clean and stay active.  Use GoodRx for the sildenafil.   I recommend getting the updated bivalent covid vaccination booster at your convenience.   Let us know if you need anything.  Ankle Exercises It is normal to feel mild stretching, pulling, tightness, or discomfort as you do these exercises, but you should stop right away if you feel sudden pain or your pain gets worse.  Stretching and range of motion exercises These exercises warm up your muscles and joints and improve the movement and flexibility of your ankle. These exercises also help to relieve pain, numbness, and tingling. Exercise A: Dorsiflexion/Plantar Flexion    Sit with your affected knee straight or bent. Do not rest your foot on anything. Flex your affected ankle to tilt the top of your foot toward your shin. Hold this position for 5 seconds. Point your toes downward to tilt the top of your foot away from your shin. Hold this position for 5 seconds. Repeat 2 times. Complete this exercise 3 times per week. Exercise B: Ankle Alphabet    Sit with your affected foot supported at your lower leg. Do not rest your foot on anything. Make sure your foot has room to move freely. Think of your affected foot as a paintbrush, and move your foot to trace each letter of the alphabet in the air. Keep your hip and knee still while you trace. Make the letters as large as you can without increasing any discomfort. Trace every letter from A to Z. Repeat 2 times. Complete this exercise 3 times per week. Strengthening exercises These exercises build strength and endurance in your ankle. Endurance is the ability to use your muscles for a long time, even after they get tired. Exercise D: Dorsiflexors    Secure a rubber exercise band or tube to an object, such as a table leg, that will stay still when the band is pulled. Secure the other end around your  affected foot. Sit on the floor, facing the object with your affected leg extended. The band or tube should be slightly tense when your foot is relaxed. Slowly flex your affected ankle and toes to bring your foot toward you. Hold this position for 3 seconds.  Slowly return your foot to the starting position, controlling the band as you do that. Do a total of 10 repetitions. Repeat 2 times. Complete this exercise 3 times per week. Exercise E: Plantar Flexors    Sit on the floor with your affected leg extended. Loop a rubber exercise band or tube around the ball of your affected foot. The ball of your foot is on the walking surface, right under your toes. The band or tube should be slightly tense when your foot is relaxed. Slowly point your toes downward, pushing them away from you. Hold this position for 3 seconds. Slowly release the tension in the band or tube, controlling smoothly until your foot is back in the starting position. Repeat for a total of 10 repetitions. Repeat 2 times. Complete this exercise 3 times per week. Exercise F: Towel Curls    Sit in a chair on a non-carpeted surface, and put your feet on the floor. Place a towel in front of your feet.  Keeping your heel on the floor, put your affected foot on the towel. Pull the towel toward you by grabbing the towel with your toes and curling them under. Keep  your heel on the floor. Let your toes relax. Grab the towel again. Keep going until the towel is completely underneath your foot. Repeat for a total of 10 repetitions. Repeat 2 times. Complete this exercise 3 times per week. Exercise G: Heel Raise ( Plantar Flexors, Standing)     Stand with your feet shoulder-width apart. Keep your weight spread evenly over the width of your feet while you rise up on your toes. Use a wall or table to steady yourself, but try not to use it for support. If this exercise is too easy, try these options: Shift your weight toward your affected  leg until you feel challenged. If told by your health care provider, lift your uninjured leg off the floor. Hold this position for 3 seconds. Repeat for a total of 10 repetitions. Repeat 2 times. Complete this exercise 3 times per week. Exercise H: Tandem Walking Stand with one foot directly in front of the other. Slowly raise your back foot up, lifting your heel before your toes, and place it directly in front of your other foot. Continue to walk in this heel-to-toe way for 10 steps or for as long as told by your health care provider. Have a countertop or wall nearby to use if needed to keep your balance, but try not to hold onto anything for support. Repeat 2 times. Complete this exercises 3 times per week. Make sure you discuss any questions you have with your health care provider. Document Released: 11/24/2004 Document Revised: 09/10/2015 Document Reviewed: 09/28/2014 Elsevier Interactive Patient Education  2018 [employer].

## 2020-11-26 ENCOUNTER — Other Ambulatory Visit: Payer: Self-pay

## 2020-11-26 DIAGNOSIS — E785 Hyperlipidemia, unspecified: Secondary | ICD-10-CM

## 2020-12-22 ENCOUNTER — Telehealth: Payer: Self-pay | Admitting: Podiatry

## 2020-12-22 DIAGNOSIS — M79676 Pain in unspecified toe(s): Secondary | ICD-10-CM

## 2020-12-22 NOTE — Telephone Encounter (Signed)
Patient has paperwork to be completed, as to why is still needs to remain oow. Tony Walters said he has been out of work since 2019 and is currently on [place].

## 2021-01-08 DIAGNOSIS — M79676 Pain in unspecified toe(s): Secondary | ICD-10-CM

## 2021-01-11 ENCOUNTER — Other Ambulatory Visit (INDEPENDENT_AMBULATORY_CARE_PROVIDER_SITE_OTHER): Payer: BLUE CROSS/BLUE SHIELD

## 2021-01-11 DIAGNOSIS — E785 Hyperlipidemia, unspecified: Secondary | ICD-10-CM | POA: Diagnosis not present

## 2021-01-11 LAB — LDL CHOLESTEROL, DIRECT: Direct LDL: 85 mg/dL

## 2021-01-11 LAB — LIPID PANEL
Cholesterol: 163 mg/dL (ref 0–200)
HDL: 46 mg/dL (ref >39.00)
NonHDL: 117.16
Total CHOL/HDL Ratio: 4
Triglycerides: 206 mg/dL — ABNORMAL HIGH (ref 0.0–149.0)
VLDL: 41.2 mg/dL — ABNORMAL HIGH (ref 0.0–40.0)

## 2021-01-12 ENCOUNTER — Telehealth: Payer: Self-pay

## 2021-01-12 NOTE — Telephone Encounter (Signed)
See result notes. 

## 2021-01-12 NOTE — Telephone Encounter (Signed)
Pt returning Robin's call regarding labs.   Telephone: [phone]

## 2021-01-21 ENCOUNTER — Ambulatory Visit (INDEPENDENT_AMBULATORY_CARE_PROVIDER_SITE_OTHER): Payer: BLUE CROSS/BLUE SHIELD | Admitting: Podiatry

## 2021-01-21 ENCOUNTER — Other Ambulatory Visit: Payer: Self-pay

## 2021-01-21 ENCOUNTER — Encounter: Payer: Self-pay | Admitting: Podiatry

## 2021-01-21 ENCOUNTER — Other Ambulatory Visit (HOSPITAL_BASED_OUTPATIENT_CLINIC_OR_DEPARTMENT_OTHER): Payer: Self-pay

## 2021-01-21 DIAGNOSIS — L309 Dermatitis, unspecified: Secondary | ICD-10-CM

## 2021-01-21 DIAGNOSIS — M778 Other enthesopathies, not elsewhere classified: Secondary | ICD-10-CM | POA: Diagnosis not present

## 2021-01-21 MED ORDER — METHYLPREDNISOLONE 4 MG PO TBPK
ORAL_TABLET | ORAL | 0 refills | Status: DC
  Filled 2021-01-21: qty 21, 6d supply, fill #0

## 2021-01-21 NOTE — Progress Notes (Signed)
He presents today states that the joint is getting sore he still getting burning across the plantar aspect of that right foot.  States that he is started to develop more more tenderness on the surgical foot that was performed in December 2019.  At this time he had had a resection of a large plantar fibroma leaving numbness and tingling in the bottom of his foot as well as a joint replacement of the first metatarsophalangeal joint leaving the foot stiff and painful at that joint.  He is also complaining of a rash along the incision site of the plantar aspect of the medial longitudinal arch.  States that it itches so bad that it is exquisitely painful and he feels like he can digs through the bottom of his foot.  Objective: Vital signs are stable he is alert oriented x3 still has stiffness of the first metatarsophalangeal joint after the replacement with tenderness on palpation he started to develop hallux interphalangeal [place].  And range of motion is only about 10 degrees dorsiflexion and plantarflexion.  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  It does demonstrate also that he is developed what appears to be an eczematous dermatitis along the distal aspect of his large plantar incision.  Assessment chronic pain right foot secondary to surgery performed December 2019 with chronic pain in the first metatarsophalangeal joint replacement as well as neuritis and nerve pain to the plantar aspect of the fibroma excision.  He has developed frontal plane range of motion consistent with lateral compensatory syndrome in that right foot as well.  Eczema is also working diagnosis for the medial longitudinal arch.  Plan: Discussed etiology pathology and surgical therapies at this point I explained to him that I do still think that he is will be able to work with anything physical to be standing or  walking or running pushing or pulling he understands that and is amendable though disappointed.  At this point I would like for him to try a Medrol Dosepak to help with the current pain level he is experiencing and also this should help take down some of that eczematous itch that he has been experiencing.  I will follow-up with him again in 3 months

## 2021-01-29 ENCOUNTER — Other Ambulatory Visit (HOSPITAL_BASED_OUTPATIENT_CLINIC_OR_DEPARTMENT_OTHER): Payer: Self-pay

## 2021-02-10 ENCOUNTER — Ambulatory Visit (INDEPENDENT_AMBULATORY_CARE_PROVIDER_SITE_OTHER): Payer: 59 | Admitting: Family Medicine

## 2021-02-10 ENCOUNTER — Other Ambulatory Visit: Payer: Self-pay | Admitting: Family Medicine

## 2021-02-10 ENCOUNTER — Other Ambulatory Visit (HOSPITAL_BASED_OUTPATIENT_CLINIC_OR_DEPARTMENT_OTHER): Payer: Self-pay

## 2021-02-10 ENCOUNTER — Encounter: Payer: Self-pay | Admitting: Family Medicine

## 2021-02-10 VITALS — BP 118/80 | HR 84 | Temp 98.3°F | Ht 74.0 in | Wt 294.5 lb

## 2021-02-10 DIAGNOSIS — E781 Pure hyperglyceridemia: Secondary | ICD-10-CM

## 2021-02-10 DIAGNOSIS — N529 Male erectile dysfunction, unspecified: Secondary | ICD-10-CM

## 2021-02-10 LAB — LIPID PANEL
Cholesterol: 163 mg/dL (ref 0–200)
HDL: 34.1 mg/dL — ABNORMAL LOW (ref >39.00)
LDL Cholesterol: 89 mg/dL (ref 0–99)
NonHDL: 128.69
Total CHOL/HDL Ratio: 5
Triglycerides: 197 mg/dL — ABNORMAL HIGH (ref 0.0–149.0)
VLDL: 39.4 mg/dL (ref 0.0–40.0)

## 2021-02-10 MED ORDER — SILDENAFIL CITRATE 100 MG PO TABS
50.0000 mg | ORAL_TABLET | Freq: Every day | ORAL | 2 refills | Status: DC | PRN
  Filled 2021-05-25: qty 30, 30d supply, fill #0

## 2021-02-10 MED ORDER — ROSUVASTATIN CALCIUM 20 MG PO TABS
20.0000 mg | ORAL_TABLET | Freq: Every day | ORAL | 3 refills | Status: DC
  Filled 2021-02-10: qty 30, 30d supply, fill #0

## 2021-02-10 NOTE — Progress Notes (Signed)
Chief Complaint  Patient presents with   Follow-up    Subjective: Hypertriglyceridemia Patient presents for Hypertriglyceridemia follow up. Currently taking nothing and compliance with treatment thus far has been good. He denies myalgias. He is adhering to a healthy diet. Exercise: some walking The patient is not known to have coexisting coronary artery disease. No Cp or SOB.   Past Medical History:  Diagnosis Date   Chicken pox as a child   Grief reaction 04/04/2013   HTN (hypertension) 04/04/2013   PTSD (post-traumatic stress disorder)    Unspecified hypothyroidism 04/07/2013    Objective: BP 118/80    Pulse 84    Temp 98.3 F (36.8 C) (Oral)    Ht 6\' 2"  (1.88 m)    Wt 294 lb 8 oz (133.6 kg)    SpO2 96%    BMI 37.81 kg/m  General: Awake, appears stated age Heart: RRR, no LE edema, no bruits Lungs: CTAB, no rales, wheezes or rhonchi. No accessory muscle use Psych: Age appropriate judgment and insight, normal affect and mood  Assessment and Plan: Hypertriglyceridemia - Plan: Lipid panel  Erectile dysfunction, unspecified erectile dysfunction type - Plan: sildenafil (VIAGRA) 100 MG tablet  Chronic, unstable. Ck lipids. If still elevated, will consider CACS vs statin rec. Counseled on diet/exercise.  F/u pending above. The patient voiced understanding and agreement to the plan.  [place], DO 02/10/21  9:10 AM

## 2021-02-10 NOTE — Patient Instructions (Addendum)
Give [place] 2-3 business days to get the results of your labs back.   Keep the diet clean and stay active.  We can consider a coronary artery calcium score depending on your results.   Let us know if you need anything.

## 2021-02-12 ENCOUNTER — Ambulatory Visit: Payer: BLUE CROSS/BLUE SHIELD | Admitting: Family Medicine

## 2021-02-12 ENCOUNTER — Other Ambulatory Visit: Payer: Self-pay | Admitting: Family Medicine

## 2021-02-12 DIAGNOSIS — E781 Pure hyperglyceridemia: Secondary | ICD-10-CM

## 2021-02-18 ENCOUNTER — Other Ambulatory Visit (HOSPITAL_BASED_OUTPATIENT_CLINIC_OR_DEPARTMENT_OTHER): Payer: Self-pay

## 2021-03-26 ENCOUNTER — Other Ambulatory Visit (INDEPENDENT_AMBULATORY_CARE_PROVIDER_SITE_OTHER): Payer: 59

## 2021-03-26 DIAGNOSIS — E781 Pure hyperglyceridemia: Secondary | ICD-10-CM

## 2021-03-26 LAB — HEPATIC FUNCTION PANEL
ALT: 33 U/L (ref 0–53)
AST: 28 U/L (ref 0–37)
Albumin: 4.4 g/dL (ref 3.5–5.2)
Alkaline Phosphatase: 69 U/L (ref 39–117)
Bilirubin, Direct: 0.1 mg/dL (ref 0.0–0.3)
Total Bilirubin: 0.5 mg/dL (ref 0.2–1.2)
Total Protein: 7.2 g/dL (ref 6.0–8.3)

## 2021-03-26 LAB — LIPID PANEL
Cholesterol: 185 mg/dL (ref 0–200)
HDL: 46.4 mg/dL (ref >39.00)
LDL Cholesterol: 115 mg/dL — ABNORMAL HIGH (ref 0–99)
NonHDL: 138.21
Total CHOL/HDL Ratio: 4
Triglycerides: 117 mg/dL (ref 0.0–149.0)
VLDL: 23.4 mg/dL (ref 0.0–40.0)

## 2021-04-05 ENCOUNTER — Encounter: Payer: Self-pay | Admitting: Dermatology

## 2021-04-05 ENCOUNTER — Other Ambulatory Visit: Payer: Self-pay

## 2021-04-05 ENCOUNTER — Ambulatory Visit: Payer: 59 | Admitting: Dermatology

## 2021-04-05 DIAGNOSIS — L301 Dyshidrosis [pompholyx]: Secondary | ICD-10-CM | POA: Diagnosis not present

## 2021-04-05 NOTE — Patient Instructions (Signed)
Otc clotrimazole cream ?

## 2021-04-12 ENCOUNTER — Telehealth: Payer: Self-pay | Admitting: *Deleted

## 2021-04-12 NOTE — Telephone Encounter (Signed)
Called [facility] and spoke with Barbaraann Share- mindy (cma) ordered bacterial culture and meant to order fungal culture. Spoke with Barbaraann Share at [facility] and she discontinued the bacterial culture and added the fungal culture code 54. I deleted the bacterial culture and added in that we collected the fungal culture to patients chart for that date of service.  ?

## 2021-04-15 ENCOUNTER — Encounter: Payer: Self-pay | Admitting: Dermatology

## 2021-04-15 NOTE — Progress Notes (Signed)
? ?  New Patient ?  ?Subjective  ?Tony Walters is a 46 y.o. male who presents for the following: Skin Problem (Lesion on the bottom of right foot x year- itching and bleeding. ). ? ?Refractory rash on foot for 1 year. ?Location:  ?Duration:  ?Quality:  ?Associated Signs/Symptoms: ?Modifying Factors:  ?Severity:  ?Timing: ?Context:  ? ? ?The following portions of the chart were reviewed this encounter and updated as appropriate:  Tobacco  Allergies  Meds  Problems  Med Hx  Surg Hx  Fam Hx   ?  ? ?Objective  ?Well appearing patient in no apparent distress; mood and affect are within normal limits. ?Right Middle Plantar Surface ?Inflammatory, hyperkeratotic, microvesicular linear patch left mid medial plantar (photo).  KOH negative immediate plus recheck 3 hours later; fungal culture sent. ? ? ? ? ? ? ?A focused examination was performed including hands, feet, nails. Relevant physical exam findings are noted in the Assessment and Plan. ? ? ?Assessment & Plan  ?Dyshidrosis ?Right Middle Plantar Surface ? ?The chief differential here would be an atypical tinea pedis, perhaps with Trichophyton mentagraphites  versus localized dyshidrotic eczema.  Pending fungal culture he can try an over-the-counter clotrimazole daily after bathing.  Follow-up by by phone or MyChart in 1 month. ? ?Related Procedures ?Culture, fungus without smear ? ? ?Other Procedures Placed This Encounter ?TIQ-NTM ?TEST AUTHORIZATION ?Cult, Fungus, Skin,Hair,Nail w/KOH ? ?

## 2021-05-12 LAB — TEST AUTHORIZATION

## 2021-05-12 LAB — ANAEROBIC AND AEROBIC CULTURE

## 2021-05-12 LAB — CULT, FUNGUS, SKIN,HAIR,NAIL W/KOH
CULTURE:: NO GROWTH
MICRO NUMBER:: 13152037
SPECIMEN QUALITY:: ADEQUATE

## 2021-05-12 LAB — TIQ-NTM

## 2021-05-25 ENCOUNTER — Encounter: Payer: Self-pay | Admitting: Family Medicine

## 2021-05-25 ENCOUNTER — Ambulatory Visit: Payer: BLUE CROSS/BLUE SHIELD | Admitting: Podiatry

## 2021-05-25 ENCOUNTER — Other Ambulatory Visit: Payer: Self-pay | Admitting: Family Medicine

## 2021-05-25 ENCOUNTER — Ambulatory Visit (INDEPENDENT_AMBULATORY_CARE_PROVIDER_SITE_OTHER): Payer: 59 | Admitting: Family Medicine

## 2021-05-25 ENCOUNTER — Other Ambulatory Visit (HOSPITAL_BASED_OUTPATIENT_CLINIC_OR_DEPARTMENT_OTHER): Payer: Self-pay

## 2021-05-25 VITALS — BP 136/86 | HR 80 | Temp 98.1°F | Ht 74.0 in | Wt 293.5 lb

## 2021-05-25 DIAGNOSIS — E039 Hypothyroidism, unspecified: Secondary | ICD-10-CM | POA: Diagnosis not present

## 2021-05-25 DIAGNOSIS — R6882 Decreased libido: Secondary | ICD-10-CM | POA: Diagnosis not present

## 2021-05-25 DIAGNOSIS — M6752 Plica syndrome, left knee: Secondary | ICD-10-CM | POA: Diagnosis not present

## 2021-05-25 DIAGNOSIS — E782 Mixed hyperlipidemia: Secondary | ICD-10-CM | POA: Insufficient documentation

## 2021-05-25 DIAGNOSIS — R69 Illness, unspecified: Secondary | ICD-10-CM | POA: Diagnosis not present

## 2021-05-25 DIAGNOSIS — I10 Essential (primary) hypertension: Secondary | ICD-10-CM | POA: Diagnosis not present

## 2021-05-25 LAB — LIPID PANEL
Cholesterol: 179 mg/dL (ref 0–200)
HDL: 46.9 mg/dL (ref >39.00)
NonHDL: 132.31
Total CHOL/HDL Ratio: 4
Triglycerides: 216 mg/dL — ABNORMAL HIGH (ref 0.0–149.0)
VLDL: 43.2 mg/dL — ABNORMAL HIGH (ref 0.0–40.0)

## 2021-05-25 LAB — T4, FREE: Free T4: 0.55 ng/dL — ABNORMAL LOW (ref 0.60–1.60)

## 2021-05-25 LAB — COMPREHENSIVE METABOLIC PANEL
ALT: 49 U/L (ref 0–53)
AST: 36 U/L (ref 0–37)
Albumin: 4.5 g/dL (ref 3.5–5.2)
Alkaline Phosphatase: 75 U/L (ref 39–117)
BUN: 13 mg/dL (ref 6–23)
CO2: 28 mEq/L (ref 19–32)
Calcium: 9.5 mg/dL (ref 8.4–10.5)
Chloride: 102 mEq/L (ref 96–112)
Creatinine, Ser: 1.32 mg/dL (ref 0.40–1.50)
GFR: 64.22 mL/min (ref >60.00)
Glucose, Bld: 112 mg/dL — ABNORMAL HIGH (ref 70–99)
Potassium: 3.9 mEq/L (ref 3.5–5.1)
Sodium: 138 mEq/L (ref 135–145)
Total Bilirubin: 0.4 mg/dL (ref 0.2–1.2)
Total Protein: 7.5 g/dL (ref 6.0–8.3)

## 2021-05-25 LAB — TSH: TSH: 23.84 u[IU]/mL — ABNORMAL HIGH (ref 0.35–5.50)

## 2021-05-25 LAB — TESTOSTERONE: Testosterone: 310.56 ng/dL (ref 300.00–890.00)

## 2021-05-25 LAB — LDL CHOLESTEROL, DIRECT: Direct LDL: 95 mg/dL

## 2021-05-25 MED ORDER — PAROXETINE HCL 10 MG PO TABS
10.0000 mg | ORAL_TABLET | Freq: Every day | ORAL | 2 refills | Status: DC
  Filled 2021-05-25: qty 30, 30d supply, fill #0

## 2021-05-25 MED ORDER — OLMESARTAN MEDOXOMIL-HCTZ 40-25 MG PO TABS
1.0000 | ORAL_TABLET | Freq: Every day | ORAL | 2 refills | Status: DC
  Filled 2021-05-25: qty 30, 30d supply, fill #0

## 2021-05-25 MED ORDER — ROSUVASTATIN CALCIUM 10 MG PO TABS
10.0000 mg | ORAL_TABLET | Freq: Every day | ORAL | 3 refills | Status: DC
  Filled 2021-05-25: qty 30, 30d supply, fill #0

## 2021-05-25 MED ORDER — LEVOTHYROXINE SODIUM 75 MCG PO TABS
75.0000 ug | ORAL_TABLET | Freq: Every day | ORAL | 3 refills | Status: DC
  Filled 2021-05-25: qty 30, 30d supply, fill #0

## 2021-05-25 MED ORDER — LEVOTHYROXINE SODIUM 50 MCG PO TABS
ORAL_TABLET | Freq: Every day | ORAL | 2 refills | Status: DC
  Filled 2021-05-25: qty 30, 30d supply, fill #0

## 2021-05-25 NOTE — Progress Notes (Signed)
Chief Complaint  ?Patient presents with  ? Follow-up  ?  6 month  ? Knee Pain  ?  Left   ? ? ?Subjective ?Tony Walters is a 46 y.o. male who presents for hypertension follow up. ?He does not monitor home blood pressures. ?He is compliant with medications- Benicar HCT 40-25 mg/d. ?Patient has these side effects of medication: none ?He is usually adhering to a healthy diet overall. ?Current exercise: limited 2/2 foot pain, chronic ?No CP or SOB. ? ?Hypothyroidism ?Patient presents for follow-up of hypothyroidism.  ?Reports compliance with medication- levothyroxine 50 mcg/d. ?Current symptoms include: denies fatigue, weight changes, heat/cold intolerance, bowel/skin changes or CVS symptoms ?He believes his dose should be unchanged ? ?L knee ?1-1.5 mo ago, no inj or change in activity. Hurts on inside of knee. Pain is sharp w lateral movements/going up steps, dull/achy otherwise. Does not catch/lock, feels unstable. No decreased ROM, swelling, redness, bruising. Has not tried anything at home.   ?  ?Past Medical History:  ?Diagnosis Date  ? Chicken pox as a child  ? Grief reaction 04/04/2013  ? HTN (hypertension) 04/04/2013  ? PTSD (post-traumatic stress disorder)   ? Unspecified hypothyroidism 04/07/2013  ? ? ?Exam ?BP 136/86 (BP Location: Left Arm, Cuff Size: Large)   Pulse 80   Temp 98.1 ?F (36.7 ?C) (Oral)   Ht '6\' 2"'$  (1.88 m)   Wt 293 lb 8 oz (133.1 kg)   SpO2 96%   BMI 37.68 kg/m?  ?General:  well developed, well nourished, in no apparent distress ?Heart: RRR, no bruits, no LE edema ?Lungs: clear to auscultation, no accessory muscle use ?MSK: TTP over medial plica, no jt line ttp, effusion/deformity, erythema, neg Lachman's, patellar apprehension/grind, varus/valgus stress, Stine's; normal active/passive ROM ?Psych: well oriented with normal range of affect and appropriate judgment/insight ? ?Essential hypertension - Plan: Lipid panel, Comprehensive metabolic panel ? ?Acquired hypothyroidism - Plan: TSH,  T4, free ? ?Low libido - Plan: Testosterone ? ?Synovial plica syndrome of left knee ? ?Chronic, stable. Cont Benicar HCT 40-25 mg/d.  Counseled on diet and exercise. ?Chronic, stable. Cont Synthroid 50 mcg/d.  ?Ck T.  ?Ice, Tylenol, NSAIDs, stretches/exercises. Consider PT vs injection if no better.  ?F/u in 6 mo or prn. ?The patient voiced understanding and agreement to the plan. ? ?Shelda Pal, DO ?05/25/21  ?9:46 AM ? ?

## 2021-05-25 NOTE — Patient Instructions (Addendum)
Give [place] 2-3 business days to get the results of your labs back.  ? ?Keep the diet clean and stay active. ? ?Ice/cold pack over area for 10-15 min twice daily. ? ?OK to take Tylenol 1000 mg (2 extra strength tabs) or 975 mg (3 regular strength tabs) every 6 hours as needed. ? ?Ibuprofen 400-600 mg (2-3 over the counter strength tabs) every 6 hours as needed for pain. ? ?Let us know if you need anything. ? ?Knee Exercises ?It is normal to feel mild stretching, pulling, tightness, or discomfort as you do these exercises, but you should stop right away if you feel sudden pain or your pain gets worse.  ?STRETCHING AND RANGE OF MOTION EXERCISES  ?These exercises warm up your muscles and joints and improve the movement and flexibility of your knee. These exercises also help to relieve pain, numbness, and tingling. ?Exercise A: Knee Extension, Prone  ?Lie on your abdomen on a bed. ?Place your left / right knee just beyond the edge of the surface so your knee is not on the bed. You can put a towel under your left / right thigh just above your knee for comfort. ?Relax your leg muscles and allow gravity to straighten your knee. You should feel a stretch behind your left / right knee. ?Hold this position for 30 seconds. ?Scoot up so your knee is supported between repetitions. ?Repeat 2 times. Complete this stretch 3 times per week. ?Exercise B: Knee Flexion, Active  ? ?  ?Lie on your back with both knees straight. If this causes back discomfort, bend your left / right knee so your foot is flat on the floor. ?Slowly slide your left / right heel back toward your buttocks until you feel a gentle stretch in the front of your knee or thigh. ?Hold this position for 30 seconds. ?Slowly slide your left / right heel back to the starting position. ?Repeat 2 times. Complete this exercise 3 times per week. ?Exercise C: Quadriceps, Prone  ? ?  ?Lie on your abdomen on a firm surface, such as a bed or padded floor. ?Bend your left / right  knee and hold your ankle. If you cannot reach your ankle or pant leg, loop a belt around your foot and grab the belt instead. ?Gently pull your heel toward your buttocks. Your knee should not slide out to the side. You should feel a stretch in the front of your thigh and knee. ?Hold this position for 30 seconds. ?Repeat 2 times. Complete this stretch 3 times per week. ?Exercise D: Hamstring, Supine  ?Lie on your back. ?Loop a belt or towel over the ball of your left / right foot. The ball of your foot is on the walking surface, right under your toes. ?Straighten your left / right knee and slowly pull on the belt to raise your leg until you feel a gentle stretch behind your knee. ?Do not let your left / right knee bend while you do this. ?Keep your other leg flat on the floor. ?Hold this position for 30 seconds. ?Repeat 2 times. Complete this stretch 3 times per week. ?STRENGTHENING EXERCISES  ?These exercises build strength and endurance in your knee. Endurance is the ability to use your muscles for a long time, even after they get tired. ?Exercise E: Quadriceps, Isometric  ? ?  ?Lie on your back with your left / right leg extended and your other knee bent. Put a rolled towel or small pillow under your knee if told  by your health care provider. ?Slowly tense the muscles in the front of your left / right thigh. You should see your kneecap slide up toward your hip or see increased dimpling just above the knee. This motion will push the back of the knee toward the floor. ?For 3 seconds, keep the muscle as tight as you can without increasing your pain. ?Relax the muscles slowly and completely. Repeat for 10 total reps ?Repeat 2 ti mes. Complete this exercise 3 times per week. ?Exercise F: Straight Leg Raises - Quadriceps  ?Lie on your back with your left / right leg extended and your other knee bent. ?Tense the muscles in the front of your left / right thigh. You should see your kneecap slide up or see increased  dimpling just above the knee. Your thigh may even shake a bit. ?Keep these muscles tight as you raise your leg 4-6 inches (10-15 cm) off the floor. Do not let your knee bend. ?Hold this position for 3 seconds. ?Keep these muscles tense as you lower your leg. ?Relax your muscles slowly and completely after each repetition. 10 total reps. ?Repeat 2 times. Complete this exercise 3 times per week. ? ?Exercise G: Hamstring Curls  ? ?  ?If told by your health care provider, do this exercise while wearing ankle weights. Begin with 5 lb weights (optional). Then increase the weight by 1 lb (0.5 kg) increments. Do not wear ankle weights that are more than 20 lbs to start with. ?Lie on your abdomen with your legs straight. ?Bend your left / right knee as far as you can without feeling pain. Keep your hips flat against the floor. ?Hold this position for 3 seconds. ?Slowly lower your leg to the starting position. Repeat for 10 reps.  ?Repeat 2 times. Complete this exercise 3 times per week. ?Exercise H: Squats (Quadriceps)  ?Stand in front of a table, with your feet and knees pointing straight ahead. You may rest your hands on the table for balance but not for support. ?Slowly bend your knees and lower your hips like you are going to sit in a chair. ?Keep your weight over your heels, not over your toes. ?Keep your lower legs upright so they are parallel with the table legs. ?Do not let your hips go lower than your knees. ?Do not bend lower than told by your health care provider. ?If your knee pain increases, do not bend as low. ?Hold the squat position for 1 second. ?Slowly push with your legs to return to standing. Do not use your hands to pull yourself to standing. ?Repeat 2 times. Complete this exercise 3 times per week. ?Exercise I: Wall Slides (Quadriceps)  ? ?  ?Lean your back against a smooth wall or door while you walk your feet out 18-24 inches (46-61 cm) from it. ?Place your feet hip-width apart. ?Slowly slide down the  wall or door until your knees Repeat 2 times. Complete this exercise every other day. ?Exercise K: Straight Leg Raises - Hip Abductors  ?Lie on your side with your left / right leg in the top position. Lie so your head, shoulder, knee, and hip line up. You may bend your bottom knee to help you keep your balance. ?Roll your hips slightly forward so your hips are stacked directly over each other and your left / right knee is facing forward. ?Leading with your heel, lift your top leg 4-6 inches (10-15 cm). You should feel the muscles in your outer hip lifting. ?Do  not let your foot drift forward. ?Do not let your knee roll toward the ceiling. ?Hold this position for 3 seconds. ?Slowly return your leg to the starting position. ?Let your muscles relax completely after each repetition. 10 total reps. ?Repeat 2 times. Complete this exercise 3 times per week. ?Exercise J: Straight Leg Raises - Hip Extensors  ?Lie on your abdomen on a firm surface. You can put a pillow under your hips if that is more comfortable. ?Tense the muscles in your buttocks and lift your left / right leg about 4-6 inches (10-15 cm). Keep your knee straight as you lift your leg. ?Hold this position for 3 seconds. ?Slowly lower your leg to the starting position. ?Let your leg relax completely after each repetition. ?Repeat 2 times. Complete this exercise 3 times per week. ?Document Released: 11/24/2004 Document Revised: 10/05/2015 Document Reviewed: 11/16/2014 ?Elsevier Interactive Patient Education ? 2017 [place]. ?

## 2021-05-28 ENCOUNTER — Other Ambulatory Visit (HOSPITAL_BASED_OUTPATIENT_CLINIC_OR_DEPARTMENT_OTHER): Payer: Self-pay

## 2021-06-03 ENCOUNTER — Other Ambulatory Visit (HOSPITAL_BASED_OUTPATIENT_CLINIC_OR_DEPARTMENT_OTHER): Payer: Self-pay

## 2021-06-03 ENCOUNTER — Ambulatory Visit: Payer: BLUE CROSS/BLUE SHIELD | Admitting: Podiatry

## 2021-06-03 DIAGNOSIS — Z9889 Other specified postprocedural states: Secondary | ICD-10-CM

## 2021-06-03 DIAGNOSIS — L309 Dermatitis, unspecified: Secondary | ICD-10-CM

## 2021-06-03 MED ORDER — CLOBETASOL PROPIONATE 0.05 % EX CREA
1.0000 "application " | TOPICAL_CREAM | Freq: Two times a day (BID) | CUTANEOUS | 1 refills | Status: DC
  Filled 2021-06-03: qty 60, 30d supply, fill #0

## 2021-06-06 NOTE — Progress Notes (Signed)
He presents today for follow-up of eczema and his joint replacement first metatarsal phalangeal joint right foot.  States that he still gets burning across the plantar aspect of the foot where the fibroma was removed and still has severe itching where the fibroma was removed.  States that he saw a dermatologist to stated that it was eczema and provided him with medication and they really made no improvement. ? ?Vital signs are stable he is alert and oriented x3.  Pulses are palpable.  There is no erythema edema cellulitis drainage or odor the incision site plantar aspect of the foot has gone on to heal completely and uneventfully though it does demonstrate eczema or some type of psoriatic process but with the itching most likely it is consistent with eczema.  He does have good range of motion consistent by 15 to 20 degrees of dorsiflexion at the first metatarsophalangeal joint though it is moderately tender.  Assessment is chronic pain of the right foot secondary to surgery performed in December 2019 with a chronic pain of the first metatarsal phalangeal joint and the neuritis and nerve pain in the plantar aspect with a fibroma excision was.  Eczema is also the working diagnosis along the medial longitudinal arch. ? ?Plan: At this point we discussed once again the etiology pathology conservative versus surgical therapies I explained to him that I still think that that I do not think that he will be able to work in anything physical or that is going to require a lot of walking or standing pushing or pulling or squatting.  He understands this and is amendable to it.  I did write him a prescription for Temovate and I explained to him how to utilize this and to taper over a week to 2 weeks.  He understands that it is amenable to it follow-up with him in about 3 months. ?

## 2021-07-06 ENCOUNTER — Other Ambulatory Visit (HOSPITAL_BASED_OUTPATIENT_CLINIC_OR_DEPARTMENT_OTHER): Payer: Self-pay

## 2021-07-06 ENCOUNTER — Encounter: Payer: Self-pay | Admitting: Family Medicine

## 2021-07-06 ENCOUNTER — Ambulatory Visit (INDEPENDENT_AMBULATORY_CARE_PROVIDER_SITE_OTHER): Payer: 59 | Admitting: Family Medicine

## 2021-07-06 VITALS — BP 120/82 | HR 80 | Temp 97.8°F | Ht 74.0 in | Wt 293.5 lb

## 2021-07-06 DIAGNOSIS — E039 Hypothyroidism, unspecified: Secondary | ICD-10-CM

## 2021-07-06 DIAGNOSIS — I10 Essential (primary) hypertension: Secondary | ICD-10-CM

## 2021-07-06 DIAGNOSIS — F418 Other specified anxiety disorders: Secondary | ICD-10-CM | POA: Diagnosis not present

## 2021-07-06 DIAGNOSIS — E781 Pure hyperglyceridemia: Secondary | ICD-10-CM

## 2021-07-06 LAB — LIPID PANEL
Cholesterol: 129 mg/dL (ref 0–200)
HDL: 50.3 mg/dL (ref >39.00)
LDL Cholesterol: 59 mg/dL (ref 0–99)
NonHDL: 78.23
Total CHOL/HDL Ratio: 3
Triglycerides: 98 mg/dL (ref 0.0–149.0)
VLDL: 19.6 mg/dL (ref 0.0–40.0)

## 2021-07-06 LAB — TSH: TSH: 11.21 u[IU]/mL — ABNORMAL HIGH (ref 0.35–5.50)

## 2021-07-06 LAB — T4, FREE: Free T4: 0.75 ng/dL (ref 0.60–1.60)

## 2021-07-06 MED ORDER — OLMESARTAN MEDOXOMIL-HCTZ 40-25 MG PO TABS
1.0000 | ORAL_TABLET | Freq: Every day | ORAL | 2 refills | Status: DC
  Filled 2021-07-06: qty 30, 30d supply, fill #0

## 2021-07-06 MED ORDER — DULOXETINE HCL 20 MG PO CPEP
20.0000 mg | ORAL_CAPSULE | Freq: Every day | ORAL | 3 refills | Status: DC
Start: 2021-07-06 — End: 2021-08-06
  Filled 2021-07-06: qty 30, 30d supply, fill #0

## 2021-07-06 NOTE — Patient Instructions (Addendum)
Consider the [facility] clinic regarding your testosterone levels.   Give [place] 2-3 business days to get the results of your labs back.   Keep the diet clean and stay active.  OK to start cycling.  Let us know if you need anything.

## 2021-07-06 NOTE — Progress Notes (Signed)
Chief Complaint  Patient presents with   Follow-up    labs    Subjective: Hyperlipidemia Patient presents for Hyperlipidemia follow up. Currently taking Crestor 10 mg daily and compliance with treatment thus far has been good. He denies myalgias. He is adhering to a healthy diet. Exercise: None The patient is not known to have coexisting coronary artery disease.  Hypothyroidism Patient presents for follow-up of hypothyroidism.  Reports compliance with medication-levothyroxine 75 mcg daily. Current symptoms include: fatigue Denies: feeling cold and cold intolerance, constipation, losing hair, anxiousness, and feeling excessive energy He believes his dose should be unchanged  Depression Patient been dealing with depression, he is taking Paxil 10 mg daily.  No adverse effects, reports compliance.  It helped a little bit, around 10 to 15% with his symptoms.  He has seen a counselor in the past but did not like his experience and does not wish to go back.  No homicidal or suicidal ideation.  No self-medication.  He has failed Lexapro, Celexa, Paxil now, and Effexor.  Past Medical History:  Diagnosis Date   Chicken pox as a child   Grief reaction 04/04/2013   HTN (hypertension) 04/04/2013   PTSD (post-traumatic stress disorder)    Unspecified hypothyroidism 04/07/2013    Objective: BP 120/82   Pulse 80   Temp 97.8 F (36.6 C) (Oral)   Ht '6\' 2"'$  (1.88 m)   Wt 293 lb 8 oz (133.1 kg)   SpO2 96%   BMI 37.68 kg/m  General: Awake, appears stated age HEENT: MMM Heart: RRR, no LE edema, no bruits Lungs: CTAB, no rales, wheezes or rhonchi. No accessory muscle use Psych: Age appropriate judgment and insight, normal affect and mood  Assessment and Plan: Acquired hypothyroidism - Plan: TSH, T4, free  Hypertriglyceridemia - Plan: Lipid panel  Depression with anxiety - Plan: DULoxetine (CYMBALTA) 20 MG capsule  Essential hypertension - Plan: olmesartan-hydrochlorothiazide (BENICAR  HCT) 40-25 MG tablet  Chronic, unsure if stable.  Check labs today.  For now continue levothyroxine 75 mcg daily. Chronic, unsure if stable.  Continue Crestor 10 mg daily for now but will likely switch him to a different statin as he is having dry mouth.  The dosage will be dictated by his labs.  Counseled on diet and exercise. Chronic, uncontrolled.  Stop Paxil.  Start Cymbalta 20 mg daily.  Counseling discussed but he does not want to do this. F/u in 1 mo. The patient voiced understanding and agreement to the plan.  [place], DO 07/06/21  12:13 PM

## 2021-07-16 ENCOUNTER — Other Ambulatory Visit (HOSPITAL_BASED_OUTPATIENT_CLINIC_OR_DEPARTMENT_OTHER): Payer: Self-pay

## 2021-08-06 ENCOUNTER — Other Ambulatory Visit (HOSPITAL_BASED_OUTPATIENT_CLINIC_OR_DEPARTMENT_OTHER): Payer: Self-pay

## 2021-08-06 ENCOUNTER — Ambulatory Visit (INDEPENDENT_AMBULATORY_CARE_PROVIDER_SITE_OTHER): Payer: 59 | Admitting: Family Medicine

## 2021-08-06 ENCOUNTER — Encounter: Payer: Self-pay | Admitting: Family Medicine

## 2021-08-06 VITALS — BP 130/82 | HR 98 | Temp 98.4°F | Ht 74.0 in | Wt 288.1 lb

## 2021-08-06 DIAGNOSIS — J069 Acute upper respiratory infection, unspecified: Secondary | ICD-10-CM

## 2021-08-06 DIAGNOSIS — F418 Other specified anxiety disorders: Secondary | ICD-10-CM | POA: Diagnosis not present

## 2021-08-06 DIAGNOSIS — E782 Mixed hyperlipidemia: Secondary | ICD-10-CM | POA: Diagnosis not present

## 2021-08-06 DIAGNOSIS — R69 Illness, unspecified: Secondary | ICD-10-CM | POA: Diagnosis not present

## 2021-08-06 MED ORDER — PROMETHAZINE-DM 6.25-15 MG/5ML PO SYRP
5.0000 mL | ORAL_SOLUTION | Freq: Four times a day (QID) | ORAL | 0 refills | Status: DC | PRN
  Filled 2021-08-06: qty 118, 6d supply, fill #0

## 2021-08-06 MED ORDER — LEVOTHYROXINE SODIUM 75 MCG PO TABS
75.0000 ug | ORAL_TABLET | Freq: Every day | ORAL | 3 refills | Status: DC
  Filled 2021-08-06: qty 30, 30d supply, fill #0

## 2021-08-06 MED ORDER — DULOXETINE HCL 30 MG PO CPEP
30.0000 mg | ORAL_CAPSULE | Freq: Every day | ORAL | 3 refills | Status: DC
  Filled 2021-08-06: qty 30, 30d supply, fill #0

## 2021-08-06 MED ORDER — ATORVASTATIN CALCIUM 20 MG PO TABS
20.0000 mg | ORAL_TABLET | Freq: Every day | ORAL | 2 refills | Status: DC
  Filled 2021-08-06: qty 30, 30d supply, fill #0

## 2021-08-06 NOTE — Patient Instructions (Addendum)
Continue to push fluids, practice good hand hygiene, and cover your mouth if you cough.  If you start having fevers, shaking or shortness of breath, seek immediate care.  Keep the diet clean and stay active.  Stop smoking please.   Let us know if you need anything.

## 2021-08-06 NOTE — Progress Notes (Signed)
Chief Complaint  Patient presents with   Follow-up    Congestion and cough    Tony Walters here for URI complaints.  Duration: 5 days  Associated symptoms: sinus congestion and coughing Denies: sinus pain, rhinorrhea, itchy watery eyes, ear pain, ear drainage, sore throat, wheezing, shortness of breath, myalgia, and fevers Treatment to date: Nyquil, Dayquil Sick contacts: Yes; MIL  Depression Patient was changed from Paxil to Cymbalta 20 mg daily.  He reports compliance with no adverse effects.  Does not miss large change.  Interested in increasing dosage.  He is not following with a counselor or psychologist.  No homicidal or suicidal ideation.  No self-medication with anything.  Hyperlipidemia Patient presents for hyperlipidemia follow up. Currently being treated with Crestor 10 mg daily and compliance with treatment thus far has been good. He denies myalgias. He did start having joint follow-up after this started. He is adhering to a healthy diet. Exercise: some walking The patient is not known to have coexisting coronary artery disease.  Past Medical History:  Diagnosis Date   Chicken pox as a child   Grief reaction 04/04/2013   HTN (hypertension) 04/04/2013   PTSD (post-traumatic stress disorder)    Unspecified hypothyroidism 04/07/2013    Objective BP 130/82   Pulse 98   Temp 98.4 F (36.9 C) (Oral)   Ht '6\' 2"'$  (1.88 m)   Wt 288 lb 2 oz (130.7 kg)   SpO2 96%   BMI 36.99 kg/m  General: Awake, alert, appears stated age HEENT: AT, [place], ears patent b/l and TM's neg, nares patent w/o discharge, pharynx pink and without exudates, MMM Neck: No masses or asymmetry Heart: RRR Lungs: CTAB, no accessory muscle use Psych: Age appropriate judgment and insight, normal mood and affect  Mixed hyperlipidemia - Plan: atorvastatin (LIPITOR) 20 MG tablet  Depression with anxiety - Plan: DULoxetine (CYMBALTA) 30 MG capsule  Viral URI with cough - Plan:  promethazine-dextromethorphan (PROMETHAZINE-DM) 6.25-15 MG/5ML syrup  Adverse effect of chronic med. Change Crestor to Lipitor 20 mg/d. Counseled on diet/exercise. Stop smoking. Chronic, uncontrolled. Increase Cymbalta from 20 mg/d to 30 mg/d. F/u in 6 weeks.  Continue to push fluids, practice good hand hygiene, cover mouth when coughing. F/u prn. If starting to experience fevers, shaking, or shortness of breath, seek immediate care. Pt voiced understanding and agreement to the plan.  [place], DO 08/06/21 10:48 AM

## 2021-08-25 IMAGING — MG DIGITAL DIAGNOSTIC BILAT W/ TOMO W/ CAD
6 of 10 series · 6 of 30 positions shown · non-contrast
Comparison: None.

ACR Breast Density Category a: The breast tissue is almost entirely
fatty.

CLINICAL DATA: 44-year-old male a palpable lump in the subareolar
right breast 2 months ago which has since resolved. At that time,
the patient had a single episode of clear nipple discharge when
expressed.

EXAM:
DIGITAL DIAGNOSTIC BILATERAL MAMMOGRAM WITH TOMOSYNTHESIS AND CAD;
ULTRASOUND RIGHT BREAST LIMITED
TECHNIQUE: Bilateral digital diagnostic mammography and breast tomosynthesis
was performed. The images were evaluated with computer-aided
detection.; Targeted ultrasound examination of the right breast was
performed

[L CC synth-2D]
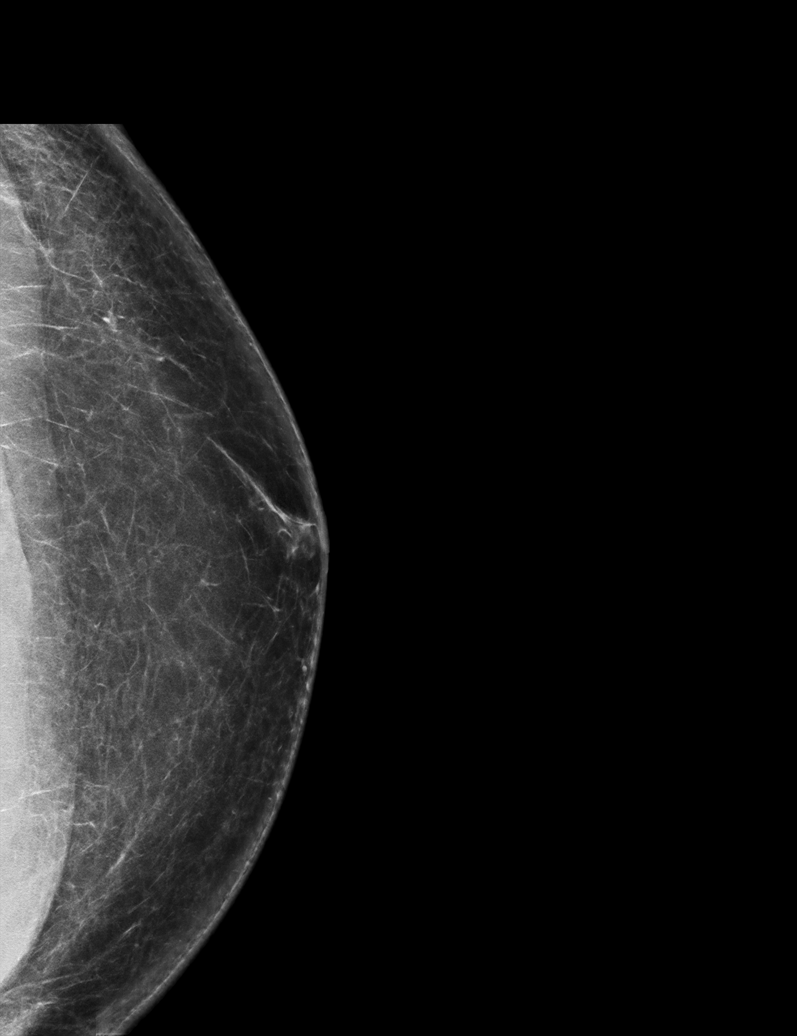

[R MLO synth-2D]
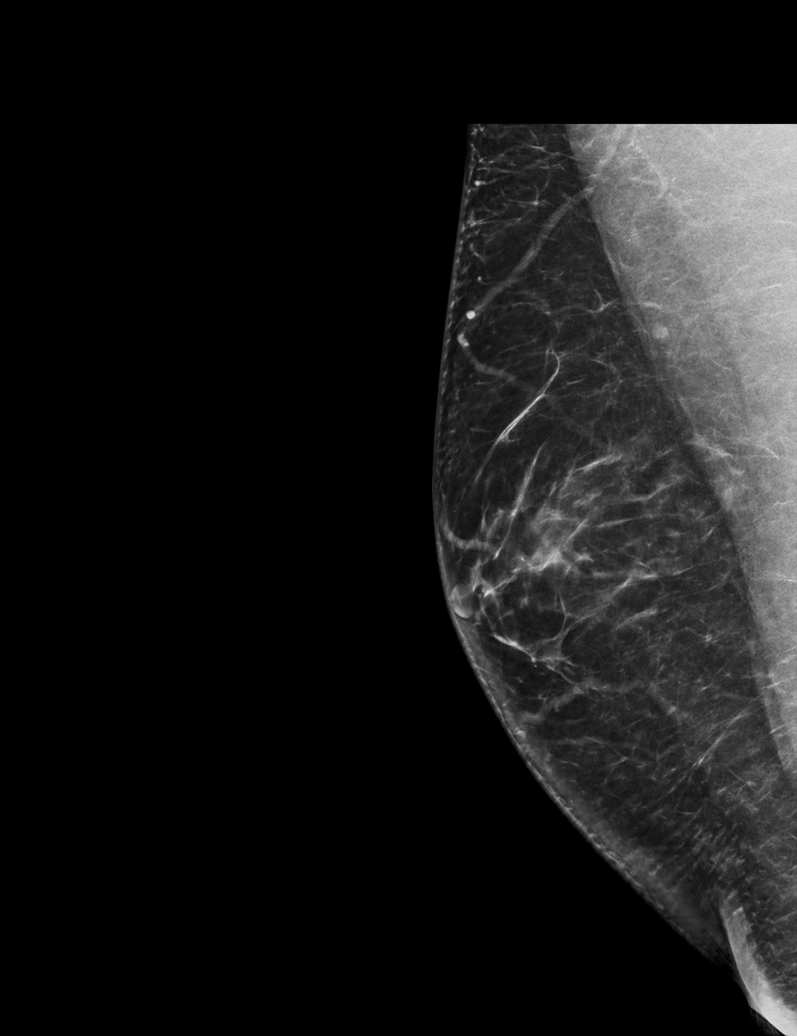

[R CC synth-2D (1 of 2)]
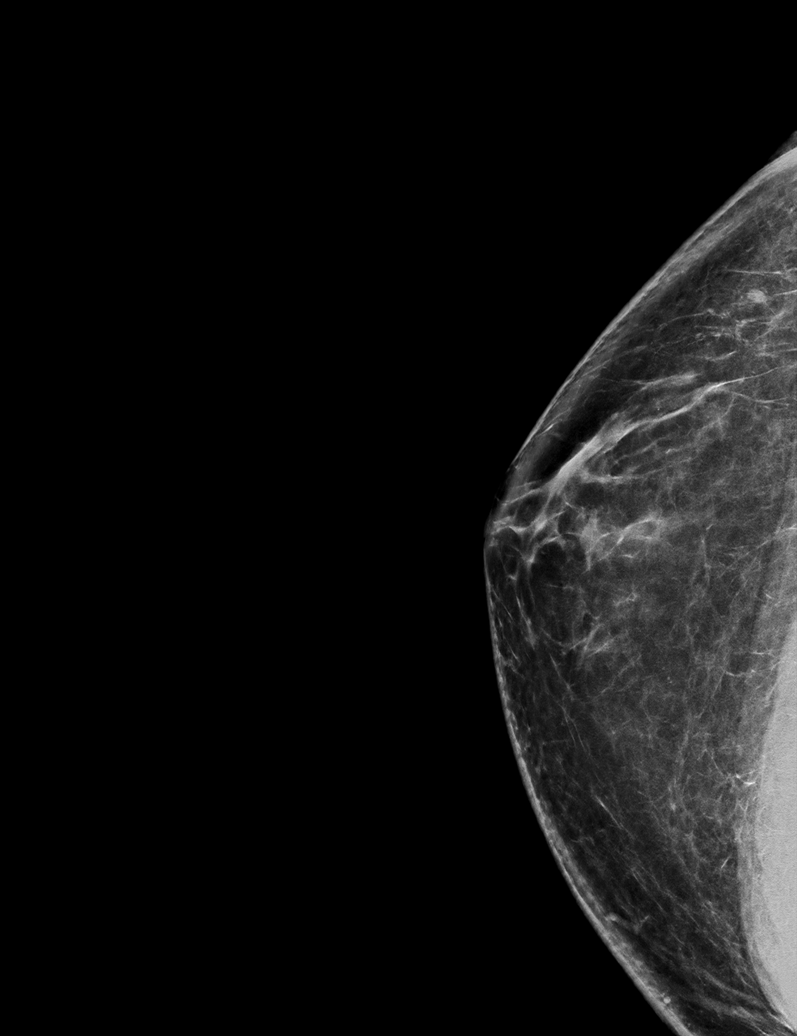

[R CC synth-2D (2 of 2)]
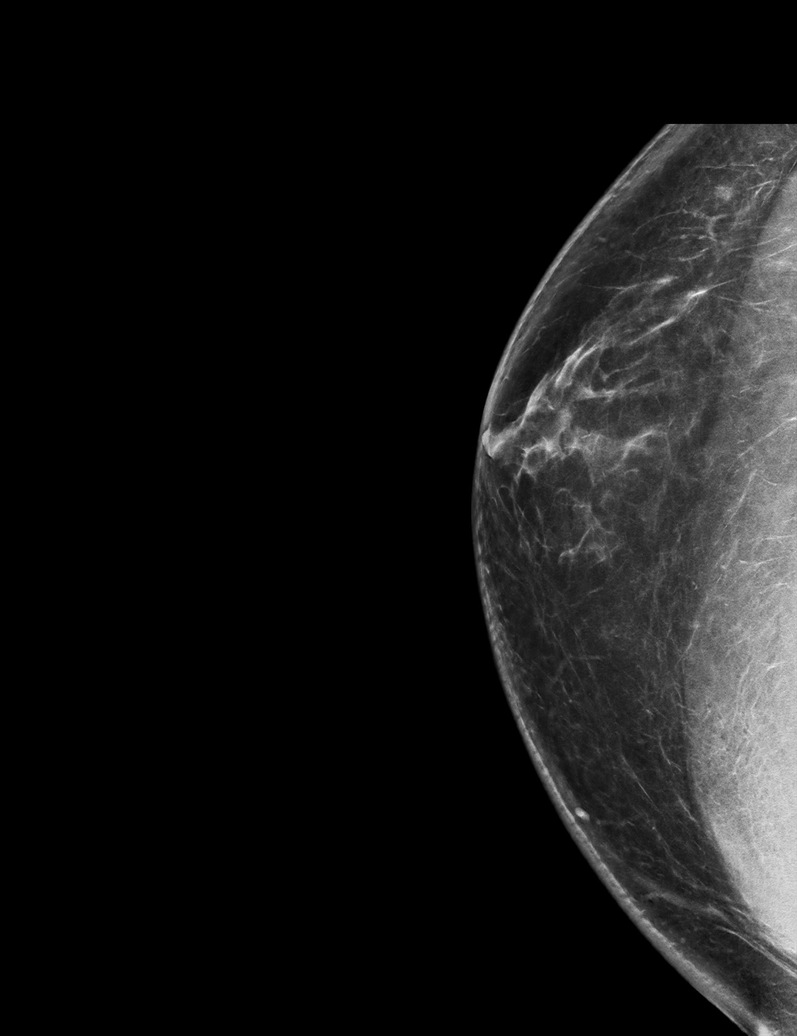

[L MLO synth-2D]
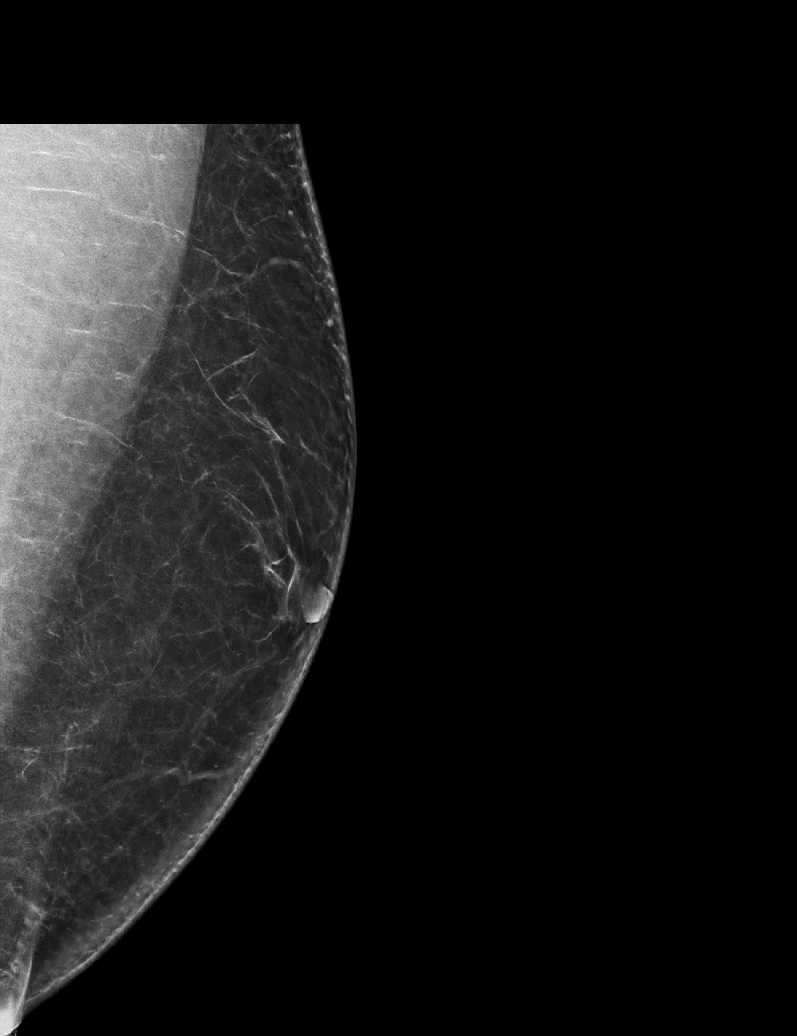

[L CC tomo · tomo slice 41/81.0]
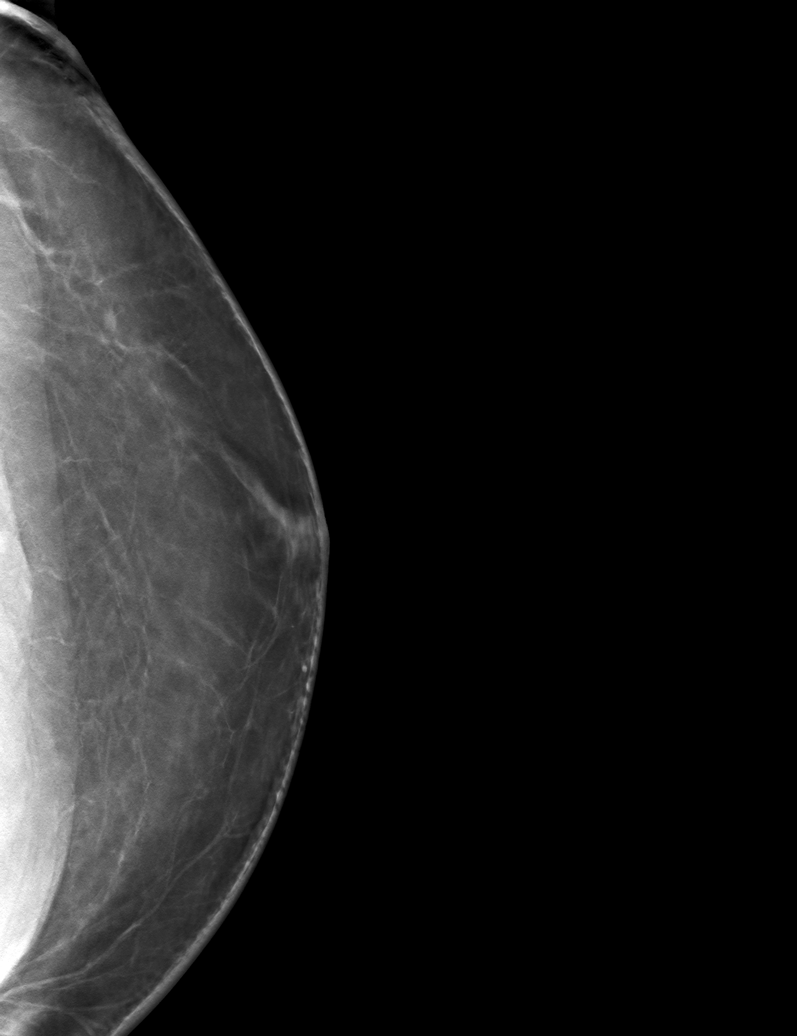

[6 of 30 positions shown; findings below may reference images not displayed]

FINDINGS: There slightly shape fibroglandular tissue arising from the right
nipple and extending posteriorly. This is consistent with mild to
moderate right-sided gynecomastia. Otherwise, no focal or suspicious
findings in either breast.

On physical exam, I am unable to elicit discharge from the right
nipple.

Targeted ultrasound is performed, showing mild gynecomastia without
focal or suspicious sonographic abnormality in the subareolar right
breast.
IMPRESSION: 1. Moderate right-sided gynecomastia which may account for the
patient's clinical symptoms.
2. Otherwise no suspicious mammographic or sonographic findings in
either breast.

RECOMMENDATION:
1. I discussed with the patient the fact that gynecomastia can occur
in older men as testosterone levels decrease with age or in younger
men with low testosterone levels, causing a change in the serum
testosterone:estrogen ratio. We also discussed other potential
etiologies of gynecomastia including numerous prescription
medications and recreational drugs (marijuana and anabolic steroids
in particular).
2. Clinical and symptomatic follow-up is recommended for the
patient's single episode of expressed right nipple discharge. If the
discharge returns, repeat evaluation may be performed.

I have discussed the findings and recommendations with the patient.
If applicable, a reminder letter will be sent to the patient
regarding the next appointment.

BI-RADS CATEGORY  2: Benign.

## 2021-09-02 ENCOUNTER — Ambulatory Visit: Payer: 59 | Admitting: Podiatry

## 2021-09-02 ENCOUNTER — Other Ambulatory Visit (HOSPITAL_BASED_OUTPATIENT_CLINIC_OR_DEPARTMENT_OTHER): Payer: Self-pay

## 2021-09-02 DIAGNOSIS — B353 Tinea pedis: Secondary | ICD-10-CM | POA: Diagnosis not present

## 2021-09-02 MED ORDER — TERBINAFINE HCL 250 MG PO TABS
250.0000 mg | ORAL_TABLET | Freq: Every day | ORAL | 1 refills | Status: DC
Start: 2021-09-02 — End: 2022-05-31
  Filled 2021-09-02: qty 30, 30d supply, fill #0

## 2021-09-02 NOTE — Progress Notes (Signed)
He presents today for follow-up of his dermatitis he states that he seems to have started to [facility] little blisters he says the scratches so much sometimes that the blisters popped.  He says the joints a little bit tender occasionally but not so bad.  Objective: Vital signs are stable he is alert orient x 3 there is absolutely no edema to the foot.  Pulses are strong and palpable.  Plantar aspect of the foot medial longitudinal arch distalmost aspect of the excision of plantar fibroma scar does demonstrate vesicular lesions that have ruptured and it does demonstrate some petechial lesions as well this does appear to be more of a tinea than a eczematous dermatitis at this point.  Assessment tinea pedis.  Plan: Will start him on Lamisil 30 tablets 1 a day for the next 30 days did have 1 refill.  I also recommended Voltaren gel when that joint begins to ache.

## 2021-09-17 ENCOUNTER — Encounter: Payer: Self-pay | Admitting: Family Medicine

## 2021-09-17 ENCOUNTER — Ambulatory Visit (INDEPENDENT_AMBULATORY_CARE_PROVIDER_SITE_OTHER): Payer: 59 | Admitting: Family Medicine

## 2021-09-17 ENCOUNTER — Other Ambulatory Visit (HOSPITAL_BASED_OUTPATIENT_CLINIC_OR_DEPARTMENT_OTHER): Payer: Self-pay

## 2021-09-17 VITALS — BP 134/80 | HR 104 | Temp 98.2°F | Ht 74.0 in | Wt 292.2 lb

## 2021-09-17 DIAGNOSIS — F418 Other specified anxiety disorders: Secondary | ICD-10-CM | POA: Diagnosis not present

## 2021-09-17 DIAGNOSIS — R058 Other specified cough: Secondary | ICD-10-CM | POA: Diagnosis not present

## 2021-09-17 DIAGNOSIS — R194 Change in bowel habit: Secondary | ICD-10-CM | POA: Diagnosis not present

## 2021-09-17 DIAGNOSIS — R69 Illness, unspecified: Secondary | ICD-10-CM | POA: Diagnosis not present

## 2021-09-17 MED ORDER — DULOXETINE HCL 60 MG PO CPEP
60.0000 mg | ORAL_CAPSULE | Freq: Every day | ORAL | 3 refills | Status: DC
  Filled 2021-09-17: qty 30, 30d supply, fill #0

## 2021-09-17 NOTE — Patient Instructions (Addendum)
Stay active.  Let me know if there are issues with the new dosage.   Take Metamucil or Benefiber daily.  If the cough persists beyond the next few weeks, let me know.   Let us know if you need anything.

## 2021-09-17 NOTE — Progress Notes (Signed)
Chief Complaint  Patient presents with   Follow-up    Subjective Tony Walters presents for f/u anxiety/depression.  Pt is currently being treated with Cymbalta 30 mg/d.  Reports around 50% improvement since treatment. No thoughts of harming self or others. No self-medication with alcohol, prescription drugs or illicit drugs. Pt is not following with a counselor/psychologist.  Bowel issue Over the past month, the patient has been having loose and frequent stools after eating.  Diet is fair overall.  No pain, bleeding, unintentional weight loss, or nausea/vomiting.  Patient was seen 6 weeks ago for upper respiratory infection.  He continues to have a dry cough.  No wheezing, shortness of breath, or systemic symptoms.  He does not smoke.  Past Medical History:  Diagnosis Date   Chicken pox as a child   Grief reaction 04/04/2013   HTN (hypertension) 04/04/2013   PTSD (post-traumatic stress disorder)    Unspecified hypothyroidism 04/07/2013   Allergies as of 09/17/2021   No Known Allergies      Medication List        Accurate as of September 17, 2021  9:42 AM. If you have any questions, ask your nurse or doctor.          STOP taking these medications    promethazine-dextromethorphan 6.25-15 MG/5ML syrup Commonly known as: PROMETHAZINE-DM Stopped by: Shelda Pal, DO       TAKE these medications    atorvastatin 20 MG tablet Commonly known as: LIPITOR Take 1 tablet (20 mg total) by mouth daily.   DULoxetine 60 MG capsule Commonly known as: Cymbalta Take 1 capsule (60 mg total) by mouth daily. What changed:  medication strength how much to take Changed by: Shelda Pal, DO   levothyroxine 75 MCG tablet Commonly known as: SYNTHROID Take 1 tablet (75 mcg total) by mouth daily.   olmesartan-hydrochlorothiazide 40-25 MG tablet Commonly known as: BENICAR HCT TAKE 1 TABLET BY MOUTH DAILY.   sildenafil 100 MG tablet Commonly known as:  Viagra Take 1/2-1 tablets (50-100 mg total) by mouth daily as needed for erectile dysfunction.   terbinafine 250 MG tablet Commonly known as: LamISIL Take 1 tablet (250 mg total) by mouth daily.        Exam BP 134/80   Pulse (!) 104   Temp 98.2 F (36.8 C) (Oral)   Ht '6\' 2"'$  (1.88 m)   Wt 292 lb 4 oz (132.6 kg)   SpO2 94%   BMI 37.52 kg/m  General:  well developed, well nourished, in no apparent distress Mouth: MMM Heart: RRR Lungs:  CTAB. No respiratory distress Psych: well oriented with normal range of affect and age-appropriate judgement/insight, alert and oriented x4.  Assessment and Plan  Depression with anxiety  Post-viral cough syndrome  Change in bowel habits  Chronic, not fully controlled.  Increase Cymbalta from 30 mg daily to 60 mg daily.  If he has any issues, he will let me know. Stop smoking.  Reassurance, he will reach out the next couple weeks if no improvement.  We will start an inhaled corticosteroid if that is the case. Stay hydrated, add a fiber supplement.  If he is still having issues, he will let me know.  Could consider treatment for IBS or referral. Follow-up in 6 months for physical or as needed. The patient voiced understanding and agreement to the plan.  [place], DO 09/17/21 9:42 AM

## 2021-11-30 ENCOUNTER — Ambulatory Visit (INDEPENDENT_AMBULATORY_CARE_PROVIDER_SITE_OTHER): Payer: 59 | Admitting: Family Medicine

## 2021-11-30 ENCOUNTER — Other Ambulatory Visit (HOSPITAL_BASED_OUTPATIENT_CLINIC_OR_DEPARTMENT_OTHER): Payer: Self-pay

## 2021-11-30 ENCOUNTER — Encounter: Payer: Self-pay | Admitting: Family Medicine

## 2021-11-30 VITALS — BP 121/82 | HR 84 | Temp 98.4°F | Ht 74.0 in | Wt 283.5 lb

## 2021-11-30 DIAGNOSIS — N529 Male erectile dysfunction, unspecified: Secondary | ICD-10-CM | POA: Diagnosis not present

## 2021-11-30 DIAGNOSIS — E039 Hypothyroidism, unspecified: Secondary | ICD-10-CM

## 2021-11-30 DIAGNOSIS — I10 Essential (primary) hypertension: Secondary | ICD-10-CM | POA: Diagnosis not present

## 2021-11-30 DIAGNOSIS — Z0000 Encounter for general adult medical examination without abnormal findings: Secondary | ICD-10-CM | POA: Diagnosis not present

## 2021-11-30 DIAGNOSIS — E782 Mixed hyperlipidemia: Secondary | ICD-10-CM

## 2021-11-30 LAB — CBC
HCT: 44.8 % (ref 39.0–52.0)
Hemoglobin: 14.8 g/dL (ref 13.0–17.0)
MCHC: 32.9 g/dL (ref 30.0–36.0)
MCV: 85.3 fl (ref 78.0–100.0)
Platelets: 243 10*3/uL (ref 150.0–400.0)
RBC: 5.26 Mil/uL (ref 4.22–5.81)
RDW: 13.9 % (ref 11.5–15.5)
WBC: 9.6 10*3/uL (ref 4.0–10.5)

## 2021-11-30 LAB — COMPREHENSIVE METABOLIC PANEL
ALT: 57 U/L — ABNORMAL HIGH (ref 0–53)
AST: 38 U/L — ABNORMAL HIGH (ref 0–37)
Albumin: 4.7 g/dL (ref 3.5–5.2)
Alkaline Phosphatase: 78 U/L (ref 39–117)
BUN: 15 mg/dL (ref 6–23)
CO2: 31 mEq/L (ref 19–32)
Calcium: 10.1 mg/dL (ref 8.4–10.5)
Chloride: 101 mEq/L (ref 96–112)
Creatinine, Ser: 1.17 mg/dL (ref 0.40–1.50)
GFR: 73.95 mL/min (ref >60.00)
Glucose, Bld: 102 mg/dL — ABNORMAL HIGH (ref 70–99)
Potassium: 4.2 mEq/L (ref 3.5–5.1)
Sodium: 139 mEq/L (ref 135–145)
Total Bilirubin: 0.4 mg/dL (ref 0.2–1.2)
Total Protein: 7.6 g/dL (ref 6.0–8.3)

## 2021-11-30 LAB — LIPID PANEL
Cholesterol: 133 mg/dL (ref 0–200)
HDL: 51.9 mg/dL (ref >39.00)
LDL Cholesterol: 60 mg/dL (ref 0–99)
NonHDL: 81.18
Total CHOL/HDL Ratio: 3
Triglycerides: 106 mg/dL (ref 0.0–149.0)
VLDL: 21.2 mg/dL (ref 0.0–40.0)

## 2021-11-30 LAB — T4, FREE: Free T4: 0.85 ng/dL (ref 0.60–1.60)

## 2021-11-30 LAB — TSH: TSH: 5.01 u[IU]/mL (ref 0.35–5.50)

## 2021-11-30 MED ORDER — ATORVASTATIN CALCIUM 20 MG PO TABS
20.0000 mg | ORAL_TABLET | Freq: Every day | ORAL | 3 refills | Status: DC
  Filled 2021-11-30: qty 30, 30d supply, fill #0

## 2021-11-30 MED ORDER — SILDENAFIL CITRATE 100 MG PO TABS
50.0000 mg | ORAL_TABLET | Freq: Every day | ORAL | 2 refills | Status: DC | PRN
  Filled 2021-11-30: qty 30, 30d supply, fill #0

## 2021-11-30 MED ORDER — OLMESARTAN MEDOXOMIL-HCTZ 40-25 MG PO TABS
1.0000 | ORAL_TABLET | Freq: Every day | ORAL | 3 refills | Status: DC
  Filled 2021-11-30: qty 30, 30d supply, fill #0

## 2021-11-30 MED ORDER — LEVOTHYROXINE SODIUM 75 MCG PO TABS
75.0000 ug | ORAL_TABLET | Freq: Every day | ORAL | 3 refills | Status: DC
  Filled 2021-11-30: qty 30, 30d supply, fill #0

## 2021-11-30 NOTE — Patient Instructions (Addendum)
Give [place] 2-3 business days to get the results of your labs back.   Keep the diet clean and stay active.  Please get me a copy of your advanced directive form at your convenience.   Consider Biotene mouthwash and chewing gum.   Take Metamucil or Benefiber daily.  Let us know if you need anything.

## 2021-11-30 NOTE — Progress Notes (Addendum)
Chief Complaint  Patient presents with   Annual Exam    Depression and appetite    Well Male Tony Walters is here for a complete physical.   His last physical was >1 year ago.  Current diet: in general, a "healthy" diet.   Current exercise: walking Weight trend: down a little  Fatigue out of ordinary? Yes after death of nephew. Seat belt? Yes.   Advanced directive? No  Health maintenance Tetanus- Yes HIV- Yes Hep C- Yes CCS- Yes  Past Medical History:  Diagnosis Date   Chicken pox as a child   Grief reaction 04/04/2013   HTN (hypertension) 04/04/2013   PTSD (post-traumatic stress disorder)    Unspecified hypothyroidism 04/07/2013     Past Surgical History:  Procedure Laterality Date   FOOT SURGERY     x4- plantar fibroma, screw in first metatarsal- both feet   HYDROCELE EXCISION / REPAIR  46 years old   [place] EXTRACTION  2010    Medications  Current Outpatient Medications on File Prior to Visit  Medication Sig Dispense Refill   atorvastatin (LIPITOR) 20 MG tablet Take 1 tablet (20 mg total) by mouth daily. 30 tablet 2   DULoxetine (CYMBALTA) 60 MG capsule Take 1 capsule (60 mg total) by mouth daily. 30 capsule 3   levothyroxine (SYNTHROID) 75 MCG tablet Take 1 tablet (75 mcg total) by mouth daily. 90 tablet 3   olmesartan-hydrochlorothiazide (BENICAR HCT) 40-25 MG tablet TAKE 1 TABLET BY MOUTH DAILY. 90 tablet 2   sildenafil (VIAGRA) 100 MG tablet Take 1/2-1 tablets (50-100 mg total) by mouth daily as needed for erectile dysfunction. 30 tablet 2   terbinafine (LAMISIL) 250 MG tablet Take 1 tablet (250 mg total) by mouth daily. 30 tablet 1   [DISCONTINUED] hydrochlorothiazide (HYDRODIURIL) 25 MG tablet Take 1 tablet (25 mg total) by mouth daily. 90 tablet 2    Allergies No Known Allergies  Family History Family History  Problem Relation Age of Onset   Congestive Heart Failure Father    Hyperlipidemia Father    Hypertension Father    Kidney disease  Maternal Grandmother    Diabetes Maternal Grandmother        type 2   Stroke Maternal Grandmother    Alcohol abuse Paternal Grandmother    Pneumonia Paternal Grandfather    Asthma Brother    Breast cancer Neg Hx    Colon cancer Neg Hx    Esophageal cancer Neg Hx    Stomach cancer Neg Hx    Rectal cancer Neg Hx     Review of Systems: Constitutional: no fevers or chills Eye:  no recent significant change in vision Ear/Nose/Mouth/Throat:  Ears:  no hearing loss Nose/Mouth/Throat:  no complaints of nasal congestion, no sore throat Cardiovascular:  no chest pain Respiratory:  no shortness of breath Gastrointestinal:  no abdominal pain, no change in bowel habits GU:  Male: negative for dysuria, frequency, and incontinence Musculoskeletal/Extremities:  no pain of the joints Integumentary (Skin/Breast):  no abnormal skin lesions reported Neurologic:  no headaches Endocrine: No unexpected weight changes Hematologic/Lymphatic:  no night sweats  Exam BP 121/82 (BP Location: Right Arm, Patient Position: Sitting, Cuff Size: Large)   Pulse 84   Temp 98.4 F (36.9 C) (Oral)   Ht '6\' 2"'$  (1.88 m)   Wt 283 lb 8 oz (128.6 kg)   SpO2 94%   BMI 36.40 kg/m  General:  well developed, well nourished, in no apparent distress Skin:  no significant  moles, warts, or growths Head:  no masses, lesions, or tenderness Eyes:  pupils equal and round, sclera anicteric without injection Ears:  canals without lesions, TMs shiny without retraction, no obvious effusion, no erythema Nose:  nares patent, mucosa normal Throat/Pharynx:  lips and gingiva without lesion; tongue and uvula midline; non-inflamed pharynx; no exudates or postnasal drainage Neck: neck supple without adenopathy, thyromegaly, or masses Lungs:  clear to auscultation, breath sounds equal bilaterally, no respiratory distress Cardio:  regular rate and rhythm, no bruits, no LE edema Abdomen:  abdomen soft, nontender; bowel sounds normal; no  masses or organomegaly Rectal: Deferred Musculoskeletal:  symmetrical muscle groups noted without atrophy or deformity Extremities:  no clubbing, cyanosis, or edema, no deformities, no skin discoloration Neuro:  gait normal; deep tendon reflexes normal and symmetric Psych: well oriented with normal range of affect and appropriate judgment/insight  Assessment and Plan  Well adult exam - Plan: CBC, Comprehensive metabolic panel, Lipid panel  Acquired hypothyroidism - Plan: TSH, T4, free   Well 46 y.o. male. Counseled on diet and exercise. Counseled on risks and benefits of prostate cancer screening with PSA. The patient agrees to forego screening.  Advanced directive form provided today.  Other orders as above. Follow up in 6 mo pending the above workup. The patient voiced understanding and agreement to the plan.  [place], DO 11/30/21 10:06 AM

## 2021-12-01 ENCOUNTER — Other Ambulatory Visit: Payer: Self-pay | Admitting: Family Medicine

## 2021-12-01 DIAGNOSIS — R932 Abnormal findings on diagnostic imaging of liver and biliary tract: Secondary | ICD-10-CM

## 2021-12-07 ENCOUNTER — Encounter: Payer: Self-pay | Admitting: Podiatry

## 2021-12-07 ENCOUNTER — Ambulatory Visit: Payer: 59 | Admitting: Podiatry

## 2021-12-07 DIAGNOSIS — B353 Tinea pedis: Secondary | ICD-10-CM | POA: Diagnosis not present

## 2021-12-07 DIAGNOSIS — L309 Dermatitis, unspecified: Secondary | ICD-10-CM | POA: Diagnosis not present

## 2021-12-07 NOTE — Progress Notes (Signed)
He presents today for follow-up of his capsulitis and tinea pedis.  States that the foot is still bothersome at times still limiting my ability to perform what I want to do.  States that the skin is feeling better but still itches occasionally.  Objective: Vital signs are stable he is alert and oriented x3.  Pulses are palpable.  I noticed on reviewing his chart that he has the elevated liver enzymes and is going for a ultrasound of the liver.  I recommended to him that he discontinue the use of the Lamisil that he has had elevated enzymes in the past weight prior to is utilizing terbinafine.  He is still having pain on the end range of motion of the first metatarsophalangeal joint of the right foot there is minimal edema no erythema cellulitis drainage or odor.  Plantar aspect of the foot appears to be healing very nicely I do not see any signs of fungus at this point.  Assessment: Continue pain of the first metatarsophalangeal joint right foot.  Resolving tinea pedis.  Plan: Recommended he follow-up with that ultrasound to get that done for his liver mass.  I also recommended that that he continue as much physical therapy and physical activity as possible to help break up scar tissue around the first metatarsophalangeal joint.  Offered him an injection but he declined.

## 2021-12-15 DIAGNOSIS — R748 Abnormal levels of other serum enzymes: Secondary | ICD-10-CM | POA: Diagnosis not present

## 2021-12-15 DIAGNOSIS — Z6836 Body mass index (BMI) 36.0-36.9, adult: Secondary | ICD-10-CM | POA: Diagnosis not present

## 2021-12-15 DIAGNOSIS — I10 Essential (primary) hypertension: Secondary | ICD-10-CM | POA: Diagnosis not present

## 2021-12-15 DIAGNOSIS — E039 Hypothyroidism, unspecified: Secondary | ICD-10-CM | POA: Diagnosis not present

## 2021-12-15 DIAGNOSIS — E669 Obesity, unspecified: Secondary | ICD-10-CM | POA: Diagnosis not present

## 2021-12-15 DIAGNOSIS — R9389 Abnormal findings on diagnostic imaging of other specified body structures: Secondary | ICD-10-CM | POA: Diagnosis not present

## 2021-12-15 DIAGNOSIS — R69 Illness, unspecified: Secondary | ICD-10-CM | POA: Diagnosis not present

## 2021-12-15 DIAGNOSIS — E785 Hyperlipidemia, unspecified: Secondary | ICD-10-CM | POA: Diagnosis not present

## 2021-12-15 DIAGNOSIS — Z79899 Other long term (current) drug therapy: Secondary | ICD-10-CM | POA: Diagnosis not present

## 2022-02-22 DIAGNOSIS — K760 Fatty (change of) liver, not elsewhere classified: Secondary | ICD-10-CM | POA: Diagnosis not present

## 2022-03-23 DIAGNOSIS — R748 Abnormal levels of other serum enzymes: Secondary | ICD-10-CM | POA: Diagnosis not present

## 2022-04-05 ENCOUNTER — Ambulatory Visit: Payer: 59 | Admitting: Podiatry

## 2022-05-03 ENCOUNTER — Other Ambulatory Visit (HOSPITAL_BASED_OUTPATIENT_CLINIC_OR_DEPARTMENT_OTHER): Payer: Self-pay

## 2022-05-03 ENCOUNTER — Encounter: Payer: Self-pay | Admitting: Podiatry

## 2022-05-03 ENCOUNTER — Ambulatory Visit: Payer: 59 | Admitting: Podiatry

## 2022-05-03 DIAGNOSIS — M778 Other enthesopathies, not elsewhere classified: Secondary | ICD-10-CM

## 2022-05-03 DIAGNOSIS — L309 Dermatitis, unspecified: Secondary | ICD-10-CM | POA: Diagnosis not present

## 2022-05-03 MED ORDER — TACROLIMUS 0.03 % EX OINT
TOPICAL_OINTMENT | Freq: Two times a day (BID) | CUTANEOUS | 0 refills | Status: DC
  Filled 2022-05-03 – 2022-05-31 (×2): qty 100, 30d supply, fill #0

## 2022-05-03 MED ORDER — CELECOXIB 200 MG PO CAPS
200.0000 mg | ORAL_CAPSULE | Freq: Two times a day (BID) | ORAL | 3 refills | Status: DC
  Filled 2022-05-03: qty 60, 30d supply, fill #0

## 2022-05-03 NOTE — Progress Notes (Signed)
he presents today for follow-up of his eczema he states that it has been really itchy plantar aspect of the right foot.  Objective: Vital signs stable alert oriented x 3.  All area measuring less than a centimeter in diameter with a small vesicular lesion and dry tissue surrounding it.  This is consistent with a eczema.  Assessment vesicular eczema.  Plan: Started him on Protopic and we will continue his anti-inflammatories.

## 2022-05-04 ENCOUNTER — Other Ambulatory Visit (HOSPITAL_BASED_OUTPATIENT_CLINIC_OR_DEPARTMENT_OTHER): Payer: Self-pay

## 2022-05-11 ENCOUNTER — Encounter: Payer: Self-pay | Admitting: *Deleted

## 2022-05-12 ENCOUNTER — Other Ambulatory Visit (HOSPITAL_BASED_OUTPATIENT_CLINIC_OR_DEPARTMENT_OTHER): Payer: Self-pay

## 2022-05-31 ENCOUNTER — Other Ambulatory Visit (HOSPITAL_BASED_OUTPATIENT_CLINIC_OR_DEPARTMENT_OTHER): Payer: Self-pay

## 2022-05-31 ENCOUNTER — Ambulatory Visit: Payer: 59 | Admitting: Family Medicine

## 2022-05-31 ENCOUNTER — Encounter: Payer: Self-pay | Admitting: Family Medicine

## 2022-05-31 VITALS — BP 134/78 | HR 73 | Temp 98.0°F | Ht 74.0 in | Wt 289.5 lb

## 2022-05-31 DIAGNOSIS — I10 Essential (primary) hypertension: Secondary | ICD-10-CM | POA: Diagnosis not present

## 2022-05-31 DIAGNOSIS — E782 Mixed hyperlipidemia: Secondary | ICD-10-CM | POA: Diagnosis not present

## 2022-05-31 DIAGNOSIS — E039 Hypothyroidism, unspecified: Secondary | ICD-10-CM

## 2022-05-31 DIAGNOSIS — F339 Major depressive disorder, recurrent, unspecified: Secondary | ICD-10-CM

## 2022-05-31 DIAGNOSIS — F411 Generalized anxiety disorder: Secondary | ICD-10-CM

## 2022-05-31 LAB — COMPREHENSIVE METABOLIC PANEL
ALT: 49 U/L (ref 0–53)
AST: 33 U/L (ref 0–37)
Albumin: 4.3 g/dL (ref 3.5–5.2)
Alkaline Phosphatase: 76 U/L (ref 39–117)
BUN: 12 mg/dL (ref 6–23)
CO2: 28 mEq/L (ref 19–32)
Calcium: 9.5 mg/dL (ref 8.4–10.5)
Chloride: 103 mEq/L (ref 96–112)
Creatinine, Ser: 1.22 mg/dL (ref 0.40–1.50)
GFR: 70.08 mL/min (ref >60.00)
Glucose, Bld: 87 mg/dL (ref 70–99)
Potassium: 4.1 mEq/L (ref 3.5–5.1)
Sodium: 140 mEq/L (ref 135–145)
Total Bilirubin: 0.4 mg/dL (ref 0.2–1.2)
Total Protein: 7.4 g/dL (ref 6.0–8.3)

## 2022-05-31 LAB — LIPID PANEL
Cholesterol: 168 mg/dL (ref 0–200)
HDL: 46.9 mg/dL (ref >39.00)
NonHDL: 121.49
Total CHOL/HDL Ratio: 4
Triglycerides: 257 mg/dL — ABNORMAL HIGH (ref 0.0–149.0)
VLDL: 51.4 mg/dL — ABNORMAL HIGH (ref 0.0–40.0)

## 2022-05-31 LAB — LDL CHOLESTEROL, DIRECT: Direct LDL: 78 mg/dL

## 2022-05-31 LAB — T4, FREE: Free T4: 0.65 ng/dL (ref 0.60–1.60)

## 2022-05-31 LAB — TSH: TSH: 19.04 u[IU]/mL — ABNORMAL HIGH (ref 0.35–5.50)

## 2022-05-31 MED ORDER — LEVOTHYROXINE SODIUM 75 MCG PO TABS
75.0000 ug | ORAL_TABLET | Freq: Every day | ORAL | 3 refills | Status: DC
  Filled 2022-05-31: qty 30, 30d supply, fill #0
  Filled 2022-08-26: qty 30, 30d supply, fill #1

## 2022-05-31 MED ORDER — PRAVASTATIN SODIUM 40 MG PO TABS
40.0000 mg | ORAL_TABLET | Freq: Every day | ORAL | 2 refills | Status: DC
Start: 2022-05-31 — End: 2022-11-29
  Filled 2022-05-31: qty 30, 30d supply, fill #0
  Filled 2022-08-26: qty 30, 30d supply, fill #1

## 2022-05-31 MED ORDER — OLMESARTAN MEDOXOMIL-HCTZ 40-25 MG PO TABS
1.0000 | ORAL_TABLET | Freq: Every day | ORAL | 3 refills | Status: DC
Start: 2022-05-31 — End: 2022-11-29
  Filled 2022-05-31: qty 30, 30d supply, fill #0
  Filled 2022-08-26: qty 30, 30d supply, fill #1

## 2022-05-31 MED ORDER — DULOXETINE HCL 60 MG PO CPEP
60.0000 mg | ORAL_CAPSULE | Freq: Every day | ORAL | 3 refills | Status: DC
Start: 2022-05-31 — End: 2022-11-29
  Filled 2022-05-31: qty 30, 30d supply, fill #0
  Filled 2022-08-26: qty 30, 30d supply, fill #1

## 2022-05-31 MED ORDER — ATORVASTATIN CALCIUM 20 MG PO TABS
20.0000 mg | ORAL_TABLET | Freq: Every day | ORAL | 3 refills | Status: DC
Start: 2022-05-31 — End: 2022-05-31
  Filled 2022-05-31: qty 90, 90d supply, fill #0

## 2022-05-31 NOTE — Patient Instructions (Signed)
Give us 2-3 business days to get the results of your labs back.   Keep the diet clean and stay active.  Let us know if you need anything. 

## 2022-05-31 NOTE — Progress Notes (Signed)
Chief Complaint  Patient presents with   Follow-up    6 month    Subjective Tony Walters is a 47 y.o. male who presents for hypertension follow up. He does not monitor home blood pressures. He is compliant with medications- Olmesartan HCT 40-25 mg/d. Patient has these side effects of medication: none He is usually adhering to a healthy diet overall. Current exercise: none No CP or SOB.   Hyperlipidemia Patient presents for dyslipidemia follow up. Currently being treated with Lipitor 20 mg/d and compliance with treatment thus far has been good. He denies myalgias. Diet/exercise above.  The patient is known to have coexisting coronary artery disease.  GAD/depression Controlled on Cymbalta 60 mg/d. Reports compliance, no AE's. No SI/HI. No self medication. Not following with therapist.   Hypothyroidism Patient presents for follow-up of hypothyroidism.  Reports compliance with medication. Current symptoms include: denies fatigue, weight changes, heat/cold intolerance, bowel/skin changes or CVS symptoms He believes his dose should be not significantly changed   Past Medical History:  Diagnosis Date   Chicken pox as a child   Grief reaction 04/04/2013   HTN (hypertension) 04/04/2013   PTSD (post-traumatic stress disorder)    Unspecified hypothyroidism 04/07/2013    Exam BP 134/78 (BP Location: Left Arm, Cuff Size: Large)   Pulse 73   Temp 98 F (36.7 C) (Oral)   Ht 6\' 2"  (1.88 m)   Wt 289 lb 8 oz (131.3 kg)   SpO2 97%   BMI 37.17 kg/m  General:  well developed, well nourished, in no apparent distress Heart: RRR, no bruits, no LE edema Lungs: clear to auscultation, no accessory muscle use Psych: well oriented with normal range of affect and appropriate judgment/insight  Essential hypertension - Plan: olmesartan-hydrochlorothiazide (BENICAR HCT) 40-25 MG tablet  Mixed hyperlipidemia - Plan: pravastatin (PRAVACHOL) 40 MG tablet, Lipid panel, Comprehensive metabolic  panel, DISCONTINUED: atorvastatin (LIPITOR) 20 MG tablet  Acquired hypothyroidism - Plan: levothyroxine (SYNTHROID) 75 MCG tablet, TSH, T4, free  Depression, recurrent (HCC) - Plan: DULoxetine (CYMBALTA) 60 MG capsule  GAD (generalized anxiety disorder) - Plan: DULoxetine (CYMBALTA) 60 MG capsule  Chronic, stable.  Continue olmesartan-HCT 40-25 mg daily. Counseled on diet and exercise. Chronic, adverse effect of medication possibly.  Stop Lipitor, start pravastatin 40 mg daily.  Check labs today. Chronic, stable.  Continue levothyroxine 75 mcg daily.  Check labs today. Chronic, stable.  Continue Cymbalta 60 mg daily. As #4.  F/u in 6 mo. The patient voiced understanding and agreement to the plan.  Jilda Roche [place], DO 05/31/22  10:14 AM

## 2022-06-01 ENCOUNTER — Other Ambulatory Visit: Payer: Self-pay | Admitting: Family Medicine

## 2022-06-01 DIAGNOSIS — E782 Mixed hyperlipidemia: Secondary | ICD-10-CM

## 2022-07-08 ENCOUNTER — Other Ambulatory Visit (INDEPENDENT_AMBULATORY_CARE_PROVIDER_SITE_OTHER): Payer: BC Managed Care – PPO

## 2022-07-08 ENCOUNTER — Other Ambulatory Visit (HOSPITAL_BASED_OUTPATIENT_CLINIC_OR_DEPARTMENT_OTHER): Payer: Self-pay

## 2022-07-08 ENCOUNTER — Other Ambulatory Visit: Payer: Self-pay | Admitting: Family Medicine

## 2022-07-08 DIAGNOSIS — E782 Mixed hyperlipidemia: Secondary | ICD-10-CM

## 2022-07-08 LAB — LIPID PANEL
Cholesterol: 188 mg/dL (ref 0–200)
HDL: 46 mg/dL (ref >39.00)
NonHDL: 141.71
Total CHOL/HDL Ratio: 4
Triglycerides: 256 mg/dL — ABNORMAL HIGH (ref 0.0–149.0)
VLDL: 51.2 mg/dL — ABNORMAL HIGH (ref 0.0–40.0)

## 2022-07-08 LAB — LDL CHOLESTEROL, DIRECT: Direct LDL: 88 mg/dL

## 2022-07-08 MED ORDER — FENOFIBRATE 48 MG PO TABS
48.0000 mg | ORAL_TABLET | Freq: Every day | ORAL | 3 refills | Status: DC
  Filled 2022-07-08: qty 27, 27d supply, fill #0
  Filled 2022-07-08: qty 3, 3d supply, fill #0
  Filled 2022-07-08: qty 30, 30d supply, fill #0

## 2022-07-18 ENCOUNTER — Other Ambulatory Visit (HOSPITAL_BASED_OUTPATIENT_CLINIC_OR_DEPARTMENT_OTHER): Payer: Self-pay

## 2022-08-26 ENCOUNTER — Other Ambulatory Visit (HOSPITAL_BASED_OUTPATIENT_CLINIC_OR_DEPARTMENT_OTHER): Payer: Self-pay

## 2022-08-26 ENCOUNTER — Other Ambulatory Visit (INDEPENDENT_AMBULATORY_CARE_PROVIDER_SITE_OTHER): Payer: BC Managed Care – PPO

## 2022-08-26 DIAGNOSIS — E782 Mixed hyperlipidemia: Secondary | ICD-10-CM

## 2022-08-26 LAB — LIPID PANEL
Cholesterol: 177 mg/dL (ref 0–200)
HDL: 47.9 mg/dL (ref >39.00)
NonHDL: 129.45
Total CHOL/HDL Ratio: 4
Triglycerides: 220 mg/dL — ABNORMAL HIGH (ref 0.0–149.0)
VLDL: 44 mg/dL — ABNORMAL HIGH (ref 0.0–40.0)

## 2022-08-26 LAB — LDL CHOLESTEROL, DIRECT: Direct LDL: 77 mg/dL

## 2022-11-03 ENCOUNTER — Ambulatory Visit: Payer: 59 | Admitting: Podiatry

## 2022-11-08 ENCOUNTER — Encounter: Payer: Self-pay | Admitting: Podiatry

## 2022-11-08 ENCOUNTER — Ambulatory Visit (INDEPENDENT_AMBULATORY_CARE_PROVIDER_SITE_OTHER): Payer: BC Managed Care – PPO | Admitting: Podiatry

## 2022-11-08 DIAGNOSIS — L309 Dermatitis, unspecified: Secondary | ICD-10-CM

## 2022-11-08 DIAGNOSIS — M778 Other enthesopathies, not elsewhere classified: Secondary | ICD-10-CM

## 2022-11-08 NOTE — Progress Notes (Signed)
Tony Walters presents today for follow-up of his right foot pain.  He states that the itching is doing a bit better however he still has significant pain in the forefoot right.  Objective: Vital signs are stable alert oriented x 3 pain on palpation to the first intermetatarsal space and limited range of motion of the first metatarsophalangeal joint of the right foot.  Basically no change from previous evaluation.  Assessment: Resolving dermatitis plantar foot.  Resolving excision of fibroma plantar aspect right foot.  However he is still having problems with the first metatarsophalangeal joint replacement.  Plan: Continue therapy continue current medication regimen and I will follow-up with him in a few months.  I recommend that he continue to be out of work.

## 2022-11-29 ENCOUNTER — Ambulatory Visit (INDEPENDENT_AMBULATORY_CARE_PROVIDER_SITE_OTHER): Payer: BC Managed Care – PPO | Admitting: Family Medicine

## 2022-11-29 ENCOUNTER — Encounter: Payer: Self-pay | Admitting: Family Medicine

## 2022-11-29 ENCOUNTER — Other Ambulatory Visit (HOSPITAL_BASED_OUTPATIENT_CLINIC_OR_DEPARTMENT_OTHER): Payer: Self-pay

## 2022-11-29 VITALS — BP 142/86 | HR 78 | Temp 98.0°F | Resp 16 | Ht 74.0 in | Wt 265.2 lb

## 2022-11-29 DIAGNOSIS — R0789 Other chest pain: Secondary | ICD-10-CM | POA: Diagnosis not present

## 2022-11-29 DIAGNOSIS — E039 Hypothyroidism, unspecified: Secondary | ICD-10-CM

## 2022-11-29 DIAGNOSIS — E782 Mixed hyperlipidemia: Secondary | ICD-10-CM | POA: Diagnosis not present

## 2022-11-29 DIAGNOSIS — F339 Major depressive disorder, recurrent, unspecified: Secondary | ICD-10-CM

## 2022-11-29 DIAGNOSIS — I10 Essential (primary) hypertension: Secondary | ICD-10-CM

## 2022-11-29 DIAGNOSIS — F411 Generalized anxiety disorder: Secondary | ICD-10-CM

## 2022-11-29 MED ORDER — DULOXETINE HCL 60 MG PO CPEP
60.0000 mg | ORAL_CAPSULE | Freq: Every day | ORAL | 1 refills | Status: DC
  Filled 2022-11-29: qty 30, 30d supply, fill #0

## 2022-11-29 MED ORDER — FENOFIBRATE 48 MG PO TABS
48.0000 mg | ORAL_TABLET | Freq: Every day | ORAL | 3 refills | Status: DC
  Filled 2022-11-29: qty 30, 30d supply, fill #0

## 2022-11-29 MED ORDER — OLMESARTAN MEDOXOMIL-HCTZ 40-25 MG PO TABS
1.0000 | ORAL_TABLET | Freq: Every day | ORAL | 1 refills | Status: DC
Start: 2022-11-29 — End: 2023-05-26
  Filled 2022-11-29: qty 30, 30d supply, fill #0

## 2022-11-29 MED ORDER — TIZANIDINE HCL 4 MG PO TABS
4.0000 mg | ORAL_TABLET | Freq: Four times a day (QID) | ORAL | 0 refills | Status: DC | PRN
Start: 2022-11-29 — End: 2023-04-11
  Filled 2022-11-29: qty 21, 6d supply, fill #0

## 2022-11-29 MED ORDER — PREDNISONE 20 MG PO TABS
40.0000 mg | ORAL_TABLET | Freq: Every day | ORAL | 0 refills | Status: AC
Start: 2022-11-29 — End: 2022-12-04
  Filled 2022-11-29: qty 10, 5d supply, fill #0

## 2022-11-29 MED ORDER — LEVOTHYROXINE SODIUM 75 MCG PO TABS
75.0000 ug | ORAL_TABLET | Freq: Every day | ORAL | 1 refills | Status: DC
Start: 2022-11-29 — End: 2023-05-26
  Filled 2022-11-29: qty 30, 30d supply, fill #0

## 2022-11-29 MED ORDER — PRAVASTATIN SODIUM 40 MG PO TABS
40.0000 mg | ORAL_TABLET | Freq: Every day | ORAL | 1 refills | Status: DC
Start: 2022-11-29 — End: 2023-05-26
  Filled 2022-11-29: qty 30, 30d supply, fill #0

## 2022-11-29 NOTE — Progress Notes (Signed)
Musculoskeletal Exam  Patient: Tony Walters DOB: 02/19/1975  DOS: 11/29/2022  SUBJECTIVE:  Chief Complaint:   Chief Complaint  Patient presents with   Shoulder Pain    Shoulder and chest pain    Tony Walters is a 47 y.o.  male for evaluation and treatment of R upper chest and R upper back pain.   Onset:  2 weeks ago. No inj or change in activity.  Location: R upper chest and R upper back pain Character:  aching  Progression of issue:  has worsened Associated symptoms: tingling down RUE Treatment: to date has been: Tylenol.   Neurovascular symptoms: no  Past Medical History:  Diagnosis Date   Chicken pox as a child   Grief reaction 04/04/2013   HTN (hypertension) 04/04/2013   PTSD (post-traumatic stress disorder)    Unspecified hypothyroidism 04/07/2013    Objective: VITAL SIGNS: BP (!) 142/86 (BP Location: Left Arm, Cuff Size: Large)   Pulse 78   Temp 98 F (36.7 C) (Oral)   Resp 16   Ht 6\' 2"  (1.88 m)   Wt 265 lb 3.2 oz (120.3 kg)   SpO2 97%   BMI 34.05 kg/m  Constitutional: Well formed, well developed. No acute distress. Thorax & Lungs: No accessory muscle use Musculoskeletal: Chest wall.   Tenderness to palpation: no Deformity: no Ecchymosis: no Tests positive: none Tests negative: [place]urling's Neurologic: Normal sensory function. No focal deficits noted. DTR's equal and symmetric in UE's. No clonus. 5/5 strength throughout UE's b/l.  Psychiatric: Normal mood. Age appropriate judgment and insight. Alert & oriented x 3.    Assessment:  Chest wall pain - Plan: predniSONE (DELTASONE) 20 MG tablet, tiZANidine (ZANAFLEX) 4 MG tablet  Essential hypertension - Plan: olmesartan-hydrochlorothiazide (BENICAR HCT) 40-25 MG tablet  Mixed hyperlipidemia - Plan: pravastatin (PRAVACHOL) 40 MG tablet  Acquired hypothyroidism - Plan: levothyroxine (SYNTHROID) 75 MCG tablet  Depression, recurrent (HCC) - Plan: DULoxetine (CYMBALTA) 60 MG capsule  GAD  (generalized anxiety disorder) - Plan: DULoxetine (CYMBALTA) 60 MG capsule  Plan: Pleurisy? Low risk for PE. No SOB or coughing. 5 d pred burst 40 mg/d, stretches/exercises, muscle relaxer (take 90 min before bedtime to ensure no drowsiness), heat, ice, Tylenol.  Didn't take BP meds. Will refill and have him monitor BP at home.  F/u prn. The patient voiced understanding and agreement to the plan.   Jilda Roche [place], DO 11/29/22  2:09 PM

## 2022-11-29 NOTE — Patient Instructions (Addendum)
Heat (pad or rice pillow in microwave) over affected area, 10-15 minutes twice daily.   Ice/cold pack over area for 10-15 min twice daily.  OK to take Tylenol 1000 mg (2 extra strength tabs) or 975 mg (3 regular strength tabs) every 6 hours as needed.  Take Zanaflex (tizanidine) 1-2 hours before planned bedtime. If it makes you drowsy, do not take during the day. You can try half a tab the following night.  Let us know if you need anything.  Pectoralis Major Rehab Ask your health care provider which exercises are safe for you. Do exercises exactly as told by your health care provider and adjust them as directed. It is normal to feel mild stretching, pulling, tightness, or discomfort as you do these exercises, but you should stop right away if you feel sudden pain or your pain gets worse. Do not begin these exercises until told by your health care provider. Stretching and range of motion exercises These exercises warm up your muscles and joints and improve the movement and flexibility of your shoulder. These exercises can also help to relieve pain, numbness, and tingling. Exercise A: Pendulum  Stand near a wall or a surface that you can hold onto for balance. Bend at the waist and let your left / right arm hang straight down. Use your other arm to keep your balance. Relax your arm and shoulder muscles, and move your hips and your trunk so your left / right arm swings freely. Your arm should swing because of the motion of your body, not because you are using your arm or shoulder muscles. Keep moving so your arm swings in the following directions, as told by your health care provider: Side to side. Forward and backward. In clockwise and counterclockwise circles. Slowly return to the starting position. Repeat 2 times. Complete this exercise 3 times per week. Exercise B: Abduction, standing Stand and hold a broomstick, a cane, or a similar object. Place your hands a little more than  shoulder-width apart on the object. Your left / right hand should be palm-up, and your other hand should be palm-down. While keeping your elbow straight and your shoulder muscles relaxed, push the stick across your body toward your left / right side. Raise your left / right arm to the side of your body and then over your head until you feel a stretch in your shoulder. Stop when you reach the angle that is recommended by your health care provider. Avoid shrugging your shoulder while you raise your arm. Keep your shoulder blade tucked down toward the middle of your spine. Hold for 10 seconds. Slowly return to the starting position. Repeat 2 times. Complete this exercise 3 times per week. Exercise C: Wand flexion, supine  Lie on your back. You may bend your knees for comfort. Hold a broomstick, a cane, or a similar object so that your hands are about shoulder-width apart on the object. Your palms should face toward your feet. Raise your left / right arm in front of your face, then behind your head (toward the floor). Use your other hand to help you do this. Stop when you feel a gentle stretch in your shoulder, or when you reach the angle that is recommended by your health care provider. Hold for 3 seconds. Use the broomstick and your other arm to help you return your left / right arm to the starting position. Repeat 2 times. Complete this exercise 3 times per week. Exercise D: Wand shoulder external rotation Stand  and hold a broomstick, a cane, or a similar object so your hands are about shoulder-width apart on the object. Start with your arms hanging down, then bend both elbows to an "L" shape (90 degrees). Keep your left / right elbow at your side. Use your other hand to push the stick so your left / right forearm moves away from your body, out to your side. Keep your left / right elbow bent to 90 degrees and keep it against your side. Stop when you feel a gentle stretch in your shoulder, or when  you reach the angle recommended by your health care provider. Hold for 10 seconds. Use the stick to help you return your left / right arm to the starting position. Repeat 2 times. Complete this exercise 3 times per week. Strengthening exercises These exercises build strength and endurance in your shoulder. Endurance is the ability to use your muscles for a long time, even after your muscles get tired. Exercise E: Scapular protraction, standing Stand so you are facing a wall. Place your feet about one arm-length away from the wall. Place your hands on the wall and straighten your elbows. Keep your hands on the wall as you push your upper back away from the wall. You should feel your shoulder blades sliding forward. Keep your elbows and your head still. If you are not sure that you are doing this exercise correctly, ask your health care provider for more instructions. Hold for 3 seconds. Slowly return to the starting position. Let your muscles relax completely before you repeat this exercise. Repeat 2 times. Complete this exercise 3 times per week. Exercise F: Shoulder blade squeezes  (scapular retraction) Sit with good posture in a stable chair. Do not let your back touch the back of the chair. Your arms should be at your sides with your elbows bent. You may rest your forearms on a pillow if that is more comfortable. Squeeze your shoulder blades together. Bring them down and back. Keep your shoulders level. Do not lift your shoulders up toward your ears. Hold for 3 seconds. Return to the starting position. Repeat 2 times. Complete this exercise 3 times per week. This information is not intended to replace advice given to you by your health care provider. Make sure you discuss any questions you have with your health care provider. Document Released: 01/10/2005 Document Revised: 10/22/2015 Document Reviewed: 09/28/2014 Elsevier Interactive Patient Education  [employer].

## 2022-12-02 ENCOUNTER — Ambulatory Visit: Payer: BC Managed Care – PPO | Admitting: Family Medicine

## 2022-12-16 ENCOUNTER — Encounter: Payer: Self-pay | Admitting: Family Medicine

## 2022-12-16 ENCOUNTER — Ambulatory Visit (INDEPENDENT_AMBULATORY_CARE_PROVIDER_SITE_OTHER): Payer: BC Managed Care – PPO | Admitting: Family Medicine

## 2022-12-16 ENCOUNTER — Ambulatory Visit: Payer: BC Managed Care – PPO

## 2022-12-16 VITALS — BP 132/78 | HR 78 | Temp 98.0°F | Resp 16 | Ht 74.0 in | Wt 267.0 lb

## 2022-12-16 DIAGNOSIS — Z0001 Encounter for general adult medical examination with abnormal findings: Secondary | ICD-10-CM

## 2022-12-16 DIAGNOSIS — Z0000 Encounter for general adult medical examination without abnormal findings: Secondary | ICD-10-CM

## 2022-12-16 DIAGNOSIS — Z13220 Encounter for screening for lipoid disorders: Secondary | ICD-10-CM

## 2022-12-16 DIAGNOSIS — R0789 Other chest pain: Secondary | ICD-10-CM

## 2022-12-16 LAB — COMPREHENSIVE METABOLIC PANEL
ALT: 29 U/L (ref 0–53)
AST: 27 U/L (ref 0–37)
Albumin: 4.5 g/dL (ref 3.5–5.2)
Alkaline Phosphatase: 67 U/L (ref 39–117)
BUN: 14 mg/dL (ref 6–23)
CO2: 32 meq/L (ref 19–32)
Calcium: 10.3 mg/dL (ref 8.4–10.5)
Chloride: 100 meq/L (ref 96–112)
Creatinine, Ser: 1.18 mg/dL (ref 0.40–1.50)
GFR: 72.66 mL/min (ref >60.00)
Glucose, Bld: 81 mg/dL (ref 70–99)
Potassium: 4.5 meq/L (ref 3.5–5.1)
Sodium: 139 meq/L (ref 135–145)
Total Bilirubin: 0.5 mg/dL (ref 0.2–1.2)
Total Protein: 7.4 g/dL (ref 6.0–8.3)

## 2022-12-16 LAB — CBC
HCT: 48 % (ref 39.0–52.0)
Hemoglobin: 15.4 g/dL (ref 13.0–17.0)
MCHC: 32.1 g/dL (ref 30.0–36.0)
MCV: 86.5 fL (ref 78.0–100.0)
Platelets: 279 10*3/uL (ref 150.0–400.0)
RBC: 5.56 Mil/uL (ref 4.22–5.81)
RDW: 14.4 % (ref 11.5–15.5)
WBC: 12.3 10*3/uL — ABNORMAL HIGH (ref 4.0–10.5)

## 2022-12-16 LAB — LIPID PANEL
Cholesterol: 189 mg/dL (ref 0–200)
HDL: 55.9 mg/dL (ref >39.00)
LDL Cholesterol: 106 mg/dL — ABNORMAL HIGH (ref 0–99)
NonHDL: 133.5
Total CHOL/HDL Ratio: 3
Triglycerides: 140 mg/dL (ref 0.0–149.0)
VLDL: 28 mg/dL (ref 0.0–40.0)

## 2022-12-16 NOTE — Patient Instructions (Addendum)
Give [place] 2-3 business days to get the results of your labs back.   Keep the diet clean and stay active.  Please get me a copy of your advanced directive form at your convenience.   Please get your X-ray done at the [facility] in [place]: [address], [place], [place] [address] [phone]  You do not need an appointment for this location.   Follow up for your chest pain will be based on your X-ray results.   Let us know if you need anything.

## 2022-12-16 NOTE — Progress Notes (Signed)
Chief Complaint  Patient presents with   Annual Exam    Annual Exam    Well Male Tony Walters is here for a complete physical.   His last physical was >1 year ago.  Current diet: in general, a "good" diet.   Current exercise: some walking, lifting Weight trend: stable Fatigue out of ordinary? No. Seat belt? Yes.   Advanced directive? No  Health maintenance Tetanus- Yes HIV- Yes Hep C- Yes  Patient continues to have right-sided chest pain.  He was treated with prednisone and Zanaflex for possible pleurisy.  He improved for couple days and then returned.  He usually gets the rest.  He has no associated cough, wheezing, shortness of breath, rash, or fevers.  Denies any trauma.  Sometimes he feels that certain positions will exacerbate things.  Past Medical History:  Diagnosis Date   Chicken pox as a child   Grief reaction 04/04/2013   HTN (hypertension) 04/04/2013   PTSD (post-traumatic stress disorder)    Unspecified hypothyroidism 04/07/2013     Past Surgical History:  Procedure Laterality Date   FOOT SURGERY     x4- plantar fibroma, screw in first metatarsal- both feet   HYDROCELE EXCISION / REPAIR  47 years old   WISDOM TOOTH EXTRACTION  2010    Medications  Current Outpatient Medications on File Prior to Visit  Medication Sig Dispense Refill   celecoxib (CELEBREX) 200 MG capsule Take 1 capsule (200 mg total) by mouth 2 (two) times daily. 60 capsule 3   DULoxetine (CYMBALTA) 60 MG capsule Take 1 capsule (60 mg total) by mouth daily. 90 capsule 1   fenofibrate (TRICOR) 48 MG tablet Take 1 tablet (48 mg total) by mouth daily. 90 tablet 3   levothyroxine (SYNTHROID) 75 MCG tablet Take 1 tablet (75 mcg total) by mouth daily. 90 tablet 1   olmesartan-hydrochlorothiazide (BENICAR HCT) 40-25 MG tablet Take 1 tablet by mouth daily. 90 tablet 1   pravastatin (PRAVACHOL) 40 MG tablet Take 1 tablet (40 mg total) by mouth daily. 90 tablet 1   sildenafil (VIAGRA) 100 MG tablet  Take 1/2-1 tablets (50-100 mg total) by mouth daily as needed for erectile dysfunction. 30 tablet 2   tiZANidine (ZANAFLEX) 4 MG tablet Take 1 tablet (4 mg total) by mouth every 6 (six) hours as needed for muscle spasms. 21 tablet 0   Allergies No Known Allergies  Family History Family History  Problem Relation Age of Onset   Congestive Heart Failure Father    Hyperlipidemia Father    Hypertension Father    Kidney disease Maternal Grandmother    Diabetes Maternal Grandmother        type 2   Stroke Maternal Grandmother    Alcohol abuse Paternal Grandmother    Pneumonia Paternal Grandfather    Asthma Brother    Breast cancer Neg Hx    Colon cancer Neg Hx    Esophageal cancer Neg Hx    Stomach cancer Neg Hx    Rectal cancer Neg Hx     Review of Systems: Constitutional: no fevers or chills Eye:  no recent significant change in vision Ear/Nose/Mouth/Throat:  Ears:  no hearing loss Nose/Mouth/Throat:  no complaints of nasal congestion, no sore throat Cardiovascular:  no current chest pain Respiratory:  no shortness of breath Gastrointestinal:  no abdominal pain, no change in bowel habits GU:  Male: negative for dysuria, frequency, and incontinence Musculoskeletal/Extremities:  no pain of the joints Integumentary (Skin/Breast):  no abnormal skin  lesions reported Neurologic:  no headaches Endocrine: No unexpected weight changes Hematologic/Lymphatic:  no night sweats  Exam BP 132/78 (BP Location: Left Arm, Patient Position: Sitting, Cuff Size: Normal)   Pulse 78   Temp 98 F (36.7 C) (Oral)   Resp 16   Ht 6\' 2"  (1.88 m)   Wt 267 lb (121.1 kg)   SpO2 99%   BMI 34.28 kg/m  General:  well developed, well nourished, in no apparent distress Skin:  no significant moles, warts, or growths Head:  no masses, lesions, or tenderness Eyes:  pupils equal and round, sclera anicteric without injection Ears:  canals without lesions, TMs shiny without retraction, no obvious effusion,  no erythema Nose:  nares patent, mucosa normal Throat/Pharynx:  lips and gingiva without lesion; tongue and uvula midline; non-inflamed pharynx; no exudates or postnasal drainage Neck: neck supple without adenopathy, thyromegaly, or masses Lungs:  clear to auscultation, breath sounds equal bilaterally, no respiratory distress Cardio:  regular rate and rhythm, no bruits, no LE edema Abdomen:  abdomen soft, nontender; bowel sounds normal; no masses or organomegaly Rectal: Deferred Musculoskeletal: Chest pain is not reproducible to palpation.  Symmetrical muscle groups noted without atrophy or deformity Extremities:  no clubbing, cyanosis, or edema, no deformities, no skin discoloration Neuro:  gait normal; deep tendon reflexes normal and symmetric Psych: well oriented with normal range of affect and appropriate judgment/insight  Assessment and Plan  Well adult exam - Plan: CBC, Comprehensive metabolic panel, Lipid panel  Atypical chest pain - Plan: DG Chest 2 View, EKG 12-Lead   Well 47 y.o. male. Counseled on diet and exercise. Advanced directive form provided today.  Other orders as above. Chest pain: Very atypical.  No risk factors for PE/DVT.  Breathing is okay.  Will check a chest x-ray and EKG today. EKG shows NSR, leftwards axis, no interval abnormalities, no ST segment or T wave changes, good R wave progression.  Could consider referral to physical therapy if both are unremarkable given pain exacerbated by head movement.  Could consider cardiology/pulmonology referral as well. Flu shot politely declined. Follow up in 6 mo pending the above workup. The patient voiced understanding and agreement to the plan.  Jilda Roche [place], DO 12/16/22 12:10 PM

## 2022-12-21 ENCOUNTER — Emergency Department (HOSPITAL_BASED_OUTPATIENT_CLINIC_OR_DEPARTMENT_OTHER): Payer: BC Managed Care – PPO

## 2022-12-21 ENCOUNTER — Encounter: Payer: Self-pay | Admitting: Family Medicine

## 2022-12-21 ENCOUNTER — Other Ambulatory Visit: Payer: Self-pay

## 2022-12-21 ENCOUNTER — Inpatient Hospital Stay (HOSPITAL_BASED_OUTPATIENT_CLINIC_OR_DEPARTMENT_OTHER)
Admission: EM | Admit: 2022-12-21 | Discharge: 2022-12-25 | DRG: 446 | Disposition: A | Discharge status: Home / Self Care | Payer: BC Managed Care – PPO | Attending: Internal Medicine | Admitting: Internal Medicine

## 2022-12-21 ENCOUNTER — Encounter (HOSPITAL_BASED_OUTPATIENT_CLINIC_OR_DEPARTMENT_OTHER): Payer: Self-pay | Admitting: Emergency Medicine

## 2022-12-21 ENCOUNTER — Ambulatory Visit: Payer: BC Managed Care – PPO | Admitting: Physician Assistant

## 2022-12-21 DIAGNOSIS — F1010 Alcohol abuse, uncomplicated: Secondary | ICD-10-CM | POA: Diagnosis present

## 2022-12-21 DIAGNOSIS — K760 Fatty (change of) liver, not elsewhere classified: Secondary | ICD-10-CM | POA: Diagnosis present

## 2022-12-21 DIAGNOSIS — E66811 Obesity, class 1: Secondary | ICD-10-CM | POA: Diagnosis present

## 2022-12-21 DIAGNOSIS — E039 Hypothyroidism, unspecified: Secondary | ICD-10-CM | POA: Diagnosis present

## 2022-12-21 DIAGNOSIS — Z833 Family history of diabetes mellitus: Secondary | ICD-10-CM | POA: Diagnosis not present

## 2022-12-21 DIAGNOSIS — Z8249 Family history of ischemic heart disease and other diseases of the circulatory system: Secondary | ICD-10-CM

## 2022-12-21 DIAGNOSIS — Z79890 Hormone replacement therapy (postmenopausal): Secondary | ICD-10-CM | POA: Diagnosis not present

## 2022-12-21 DIAGNOSIS — K8071 Calculus of gallbladder and bile duct without cholecystitis with obstruction: Secondary | ICD-10-CM | POA: Diagnosis present

## 2022-12-21 DIAGNOSIS — R609 Edema, unspecified: Secondary | ICD-10-CM | POA: Diagnosis present

## 2022-12-21 DIAGNOSIS — Z4889 Encounter for other specified surgical aftercare: Secondary | ICD-10-CM | POA: Diagnosis not present

## 2022-12-21 DIAGNOSIS — F17210 Nicotine dependence, cigarettes, uncomplicated: Secondary | ICD-10-CM | POA: Diagnosis present

## 2022-12-21 DIAGNOSIS — Z83438 Family history of other disorder of lipoprotein metabolism and other lipidemia: Secondary | ICD-10-CM

## 2022-12-21 DIAGNOSIS — E785 Hyperlipidemia, unspecified: Secondary | ICD-10-CM | POA: Diagnosis present

## 2022-12-21 DIAGNOSIS — F32A Depression, unspecified: Secondary | ICD-10-CM | POA: Diagnosis present

## 2022-12-21 DIAGNOSIS — F418 Other specified anxiety disorders: Secondary | ICD-10-CM | POA: Diagnosis present

## 2022-12-21 DIAGNOSIS — K838 Other specified diseases of biliary tract: Secondary | ICD-10-CM | POA: Diagnosis present

## 2022-12-21 DIAGNOSIS — R7401 Elevation of levels of liver transaminase levels: Secondary | ICD-10-CM | POA: Diagnosis present

## 2022-12-21 DIAGNOSIS — Z823 Family history of stroke: Secondary | ICD-10-CM

## 2022-12-21 DIAGNOSIS — F419 Anxiety disorder, unspecified: Secondary | ICD-10-CM | POA: Diagnosis present

## 2022-12-21 DIAGNOSIS — F4310 Post-traumatic stress disorder, unspecified: Secondary | ICD-10-CM | POA: Diagnosis present

## 2022-12-21 DIAGNOSIS — Z6834 Body mass index (BMI) 34.0-34.9, adult: Secondary | ICD-10-CM

## 2022-12-21 DIAGNOSIS — I10 Essential (primary) hypertension: Secondary | ICD-10-CM | POA: Diagnosis present

## 2022-12-21 DIAGNOSIS — K8689 Other specified diseases of pancreas: Secondary | ICD-10-CM | POA: Diagnosis not present

## 2022-12-21 DIAGNOSIS — R1010 Upper abdominal pain, unspecified: Principal | ICD-10-CM

## 2022-12-21 DIAGNOSIS — Z79899 Other long term (current) drug therapy: Secondary | ICD-10-CM | POA: Diagnosis not present

## 2022-12-21 DIAGNOSIS — K8021 Calculus of gallbladder without cholecystitis with obstruction: Secondary | ICD-10-CM

## 2022-12-21 DIAGNOSIS — Z825 Family history of asthma and other chronic lower respiratory diseases: Secondary | ICD-10-CM

## 2022-12-21 DIAGNOSIS — K8070 Calculus of gallbladder and bile duct without cholecystitis without obstruction: Secondary | ICD-10-CM | POA: Diagnosis not present

## 2022-12-21 HISTORY — DX: Fatty (change of) liver, not elsewhere classified: K76.0

## 2022-12-21 LAB — CBC
HCT: 43.3 % (ref 39.0–52.0)
Hemoglobin: 14.6 g/dL (ref 13.0–17.0)
MCH: 27.6 pg (ref 26.0–34.0)
MCHC: 33.7 g/dL (ref 30.0–36.0)
MCV: 81.9 fL (ref 80.0–100.0)
Platelets: 220 10*3/uL (ref 150–400)
RBC: 5.29 MIL/uL (ref 4.22–5.81)
RDW: 14.5 % (ref 11.5–15.5)
WBC: 11.5 10*3/uL — ABNORMAL HIGH (ref 4.0–10.5)
nRBC: 0 % (ref 0.0–0.2)

## 2022-12-21 LAB — URINALYSIS, MICROSCOPIC (REFLEX)

## 2022-12-21 LAB — HEPATIC FUNCTION PANEL
ALT: 147 U/L — ABNORMAL HIGH (ref 0–44)
AST: 63 U/L — ABNORMAL HIGH (ref 15–41)
Albumin: 3.9 g/dL (ref 3.5–5.0)
Alkaline Phosphatase: 155 U/L — ABNORMAL HIGH (ref 38–126)
Bilirubin, Direct: 0.7 mg/dL — ABNORMAL HIGH (ref 0.0–0.2)
Indirect Bilirubin: 0.6 mg/dL (ref 0.3–0.9)
Total Bilirubin: 1.3 mg/dL — ABNORMAL HIGH (ref <1.2)
Total Protein: 7.7 g/dL (ref 6.5–8.1)

## 2022-12-21 LAB — I-STAT CHEM 8, ED
BUN: 10 mg/dL (ref 6–20)
Calcium, Ion: 1.12 mmol/L — ABNORMAL LOW (ref 1.15–1.40)
Chloride: 103 mmol/L (ref 98–111)
Creatinine, Ser: 1.2 mg/dL (ref 0.61–1.24)
Glucose, Bld: 106 mg/dL — ABNORMAL HIGH (ref 70–99)
HCT: 47 % (ref 39.0–52.0)
Hemoglobin: 16 g/dL (ref 13.0–17.0)
Potassium: 4 mmol/L (ref 3.5–5.1)
Sodium: 137 mmol/L (ref 135–145)
TCO2: 22 mmol/L (ref 22–32)

## 2022-12-21 LAB — URINALYSIS, ROUTINE W REFLEX MICROSCOPIC
Glucose, UA: NEGATIVE mg/dL
Ketones, ur: NEGATIVE mg/dL
Leukocytes,Ua: NEGATIVE
Nitrite: NEGATIVE
Protein, ur: NEGATIVE mg/dL
Specific Gravity, Urine: 1.015 (ref 1.005–1.030)
pH: 6.5 (ref 5.0–8.0)

## 2022-12-21 LAB — CBG MONITORING, ED: Glucose-Capillary: 125 mg/dL — ABNORMAL HIGH (ref 70–99)

## 2022-12-21 LAB — LIPASE, BLOOD: Lipase: 44 U/L (ref 11–51)

## 2022-12-21 MED ORDER — HYDROMORPHONE HCL 1 MG/ML IJ SOLN
0.5000 mg | Freq: Once | INTRAMUSCULAR | Status: AC
  Administered 2022-12-21: 0.5 mg via INTRAVENOUS
  Filled 2022-12-21: qty 1

## 2022-12-21 MED ORDER — ONDANSETRON HCL 4 MG/2ML IJ SOLN
4.0000 mg | Freq: Once | INTRAMUSCULAR | Status: AC
  Administered 2022-12-21: 4 mg via INTRAVENOUS
  Filled 2022-12-21: qty 2

## 2022-12-21 MED ORDER — SODIUM CHLORIDE 0.9 % IV BOLUS
1000.0000 mL | Freq: Once | INTRAVENOUS | Status: AC
  Administered 2022-12-21: 1000 mL via INTRAVENOUS

## 2022-12-21 NOTE — ED Notes (Signed)
Report given to [place], [place]

## 2022-12-21 NOTE — ED Notes (Signed)
[facility] called for transport, No Current ETA... ED Nurse will call floor for report Called @ 21:23

## 2022-12-21 NOTE — Progress Notes (Signed)
[facility] service has been paged to inform them of pt's arrival to unit/room 1316.

## 2022-12-21 NOTE — Progress Notes (Deleted)
      Established patient visit   Patient: Tony Walters   DOB: [birth year]   47 y.o. Male  MRN: [id number] Visit Date: 12/21/2022  Today's healthcare provider: Alfredia Ferguson, PA-C   No chief complaint on file.  Subjective     ***  Medications: Outpatient Medications Prior to Visit  Medication Sig   celecoxib (CELEBREX) 200 MG capsule Take 1 capsule (200 mg total) by mouth 2 (two) times daily.   DULoxetine (CYMBALTA) 60 MG capsule Take 1 capsule (60 mg total) by mouth daily.   fenofibrate (TRICOR) 48 MG tablet Take 1 tablet (48 mg total) by mouth daily.   levothyroxine (SYNTHROID) 75 MCG tablet Take 1 tablet (75 mcg total) by mouth daily.   olmesartan-hydrochlorothiazide (BENICAR HCT) 40-25 MG tablet Take 1 tablet by mouth daily.   pravastatin (PRAVACHOL) 40 MG tablet Take 1 tablet (40 mg total) by mouth daily.   sildenafil (VIAGRA) 100 MG tablet Take 1/2-1 tablets (50-100 mg total) by mouth daily as needed for erectile dysfunction.   tiZANidine (ZANAFLEX) 4 MG tablet Take 1 tablet (4 mg total) by mouth every 6 (six) hours as needed for muscle spasms.   No facility-administered medications prior to visit.    Review of Systems {Insert previous labs (optional):23779} {See past labs  Heme  Chem  Endocrine  Serology  Results Review (optional):1}   Objective    There were no vitals taken for this visit. {Insert last BP/Wt (optional):23777}{See vitals history (optional):1}  Physical Exam  ***  No results found for any visits on 12/21/22.  Assessment & Plan    There are no diagnoses linked to this encounter.  ***  No follow-ups on file.       Alfredia Ferguson, PA-C  [facility] at [facility] [phone] (phone) [phone] (fax)  [facility]

## 2022-12-21 NOTE — ED Provider Notes (Signed)
[facility] Provider Note   CSN: [id number] Arrival date & time: 12/21/22  1459     History  Chief Complaint  Patient presents with   Abdominal Pain    Tony Walters is a 47 y.o. male.  Patient presents to the department today for evaluation of abdominal pain.  Symptoms started 5 days ago.  Symptoms have gradually worsened.  Pain is in the upper abdomen and right upper quadrant of the abdomen.  Reports low-grade fever to 100 F.  Reports decreased appetite without vomiting.  Has had some diarrhea without blood.  No chest pain or shortness of breath.  No cough.  No treatments otherwise at home.  No history of abdominal surgeries.       Home Medications Prior to Admission medications   Medication Sig Start Date End Date Taking? Authorizing Provider  celecoxib (CELEBREX) 200 MG capsule Take 1 capsule (200 mg total) by mouth 2 (two) times daily. 05/03/22   Hyatt, Max T, DPM  DULoxetine (CYMBALTA) 60 MG capsule Take 1 capsule (60 mg total) by mouth daily. 11/29/22   Sharlene Dory, DO  fenofibrate (TRICOR) 48 MG tablet Take 1 tablet (48 mg total) by mouth daily. 11/29/22   Sharlene Dory, DO  levothyroxine (SYNTHROID) 75 MCG tablet Take 1 tablet (75 mcg total) by mouth daily. 11/29/22   Sharlene Dory, DO  olmesartan-hydrochlorothiazide (BENICAR HCT) 40-25 MG tablet Take 1 tablet by mouth daily. 11/29/22   Sharlene Dory, DO  pravastatin (PRAVACHOL) 40 MG tablet Take 1 tablet (40 mg total) by mouth daily. 11/29/22   Sharlene Dory, DO  sildenafil (VIAGRA) 100 MG tablet Take 1/2-1 tablets (50-100 mg total) by mouth daily as needed for erectile dysfunction. 11/30/21   Wendling, Jilda Roche, DO  tiZANidine (ZANAFLEX) 4 MG tablet Take 1 tablet (4 mg total) by mouth every 6 (six) hours as needed for muscle spasms. 11/29/22   Sharlene Dory, DO  hydrochlorothiazide (HYDRODIURIL) 25 MG tablet Take  1 tablet (25 mg total) by mouth daily. 08/21/19 04/20/20  Sharlene Dory, DO      Allergies    Patient has no known allergies.    Review of Systems   Review of Systems  Physical Exam Updated Vital Signs BP (!) 148/88 (BP Location: Left Arm)   Pulse (!) 107   Temp 100 F (37.8 C) (Oral)   Resp 18   Ht 6\' 2"  (1.88 m)   Wt 120.2 kg   SpO2 97%   BMI 34.02 kg/m  Physical Exam Vitals and nursing note reviewed.  Constitutional:      General: He is not in acute distress.    Appearance: He is well-developed.  HENT:     Head: Normocephalic and atraumatic.  Eyes:     General:        Right eye: No discharge.        Left eye: No discharge.     Conjunctiva/sclera: Conjunctivae normal.  Cardiovascular:     Rate and Rhythm: Regular rhythm. Tachycardia present.     Heart sounds: Normal heart sounds.  Pulmonary:     Effort: Pulmonary effort is normal.     Breath sounds: Normal breath sounds.  Abdominal:     Palpations: Abdomen is soft.     Tenderness: There is abdominal tenderness in the right upper quadrant and epigastric area. There is no guarding or rebound. Negative signs include Murphy's sign, Rovsing's sign and McBurney's sign.  Musculoskeletal:     Cervical back: Normal range of motion and neck supple.  Skin:    General: Skin is warm and dry.  Neurological:     Mental Status: He is alert.     ED Results / Procedures / Treatments   Labs (all labs ordered are listed, but only abnormal results are displayed) Labs Reviewed  CBC - Abnormal; Notable for the following components:      Result Value   WBC 11.5 (*)    All other components within normal limits  URINALYSIS, ROUTINE W REFLEX MICROSCOPIC - Abnormal; Notable for the following components:   Hgb urine dipstick SMALL (*)    Bilirubin Urine SMALL (*)    All other components within normal limits  HEPATIC FUNCTION PANEL - Abnormal; Notable for the following components:   AST 63 (*)    ALT 147 (*)    Alkaline  Phosphatase 155 (*)    Total Bilirubin 1.3 (*)    Bilirubin, Direct 0.7 (*)    All other components within normal limits  URINALYSIS, MICROSCOPIC (REFLEX) - Abnormal; Notable for the following components:   Bacteria, UA RARE (*)    All other components within normal limits  CBG MONITORING, ED - Abnormal; Notable for the following components:   Glucose-Capillary 125 (*)    All other components within normal limits  I-STAT CHEM 8, ED - Abnormal; Notable for the following components:   Glucose, Bld 106 (*)    Calcium, Ion 1.12 (*)    All other components within normal limits  LIPASE, BLOOD  CBG MONITORING, ED    EKG EKG Interpretation Date/Time:  Wednesday December 21 2022 15:18:46 EST Ventricular Rate:  104 PR Interval:  164 QRS Duration:  88 QT Interval:  320 QTC Calculation: 420 R Axis:   -12  Text Interpretation: Sinus tachycardia Otherwise normal ECG When compared with ECG of 16-Jul-2015 05:14, PREVIOUS ECG IS PRESENT Confirmed by Beckey Downing [phone]) on 12/21/2022 3:22:03 PM  Radiology No results found.  Procedures Procedures    Medications Ordered in ED Medications  sodium chloride 0.9 % bolus 1,000 mL (1,000 mLs Intravenous New Bag/Given 12/21/22 1706)  HYDROmorphone (DILAUDID) injection 0.5 mg (0.5 mg Intravenous Given 12/21/22 1701)  ondansetron (ZOFRAN) injection 4 mg (4 mg Intravenous Given 12/21/22 1700)    ED Course/ Medical Decision Making/ A&P    Patient seen and examined. History obtained directly from patient. Work-up including labs, imaging, EKG ordered in triage, if performed, were reviewed.    Labs/EKG: Independently reviewed and interpreted.  This included: CBC showing mildly elevated white blood cell count at 11.5 otherwise normal hemoglobin; i-STAT Chem-8 with normal electrolytes and kidney function; hepatic function panel with mildly elevated LFTs and bili; UA without compelling signs of infection.  EKG personally reviewed and interpreted as above  showing sinus tachycardia, no ischemic findings.  Imaging: Given right upper quadrant pain with mildly elevated LFTs, will start with right upper quadrant ultrasound evaluate for signs of cholecystitis.  Medications/Fluids: Ordered: Dilaudid/Zofran, IV fluid bolus  Most recent vital signs reviewed and are as follows: BP (!) 148/88 (BP Location: Left Arm)   Pulse (!) 107   Temp 100 F (37.8 C) (Oral)   Resp 18   Ht 6\' 2"  (1.88 m)   Wt 120.2 kg   SpO2 97%   BMI 34.02 kg/m   Initial impression: Abdominal pain  6:54 PM Signout to [place] PA-C at shift change.  Currently pending results of right upper quadrant ultrasound which  has been performed.  Plan: Follow-up on ultrasound results, if negative will need CT imaging of the abdomen and pelvis to look for other etiologies.                                Medical Decision Making Amount and/or Complexity of Data Reviewed Radiology: ordered.  Risk Prescription drug management.   For this patient's complaint of abdominal pain, the following conditions were considered on the differential diagnosis: gastritis/PUD, enteritis/duodenitis, appendicitis, cholelithiasis/cholecystitis, cholangitis, pancreatitis, ruptured viscus, colitis, diverticulitis, small/large bowel obstruction, proctitis, cystitis, pyelonephritis, ureteral colic, aortic dissection, aortic aneurysm. Atypical chest etiologies were also considered including ACS, PE, and pneumonia.          Final Clinical Impression(s) / ED Diagnoses Final diagnoses:  Upper abdominal pain    Rx / DC Orders ED Discharge Orders     None         Renne Crigler, PA-C 12/21/22 Herbie Baltimore    Arby Barrette, MD 12/21/22 2314

## 2022-12-21 NOTE — Progress Notes (Signed)
Pt has arrived to unit, room 1316, warm, dry,no visible distress, via [facility].

## 2022-12-21 NOTE — Progress Notes (Addendum)
Hospitalist Transfer Note:    Nursing staff, Please call [facility] Admits & Consults System-Wide number on Amion [phone]) as soon as patient's arrival, so appropriate admitting provider can evaluate the pt.   Transferring facility: [facility] Requesting provider: Elpidio Anis, PA (EDP at [facility]) Reason for transfer: admission for further evaluation and management of acute transaminitis, with concern for potential choledocholithiasis.   47  year old male who presented to [facility] ED complaining of new onset abdominal discomfort associated nausea/vomiting.  EDP conveys that today's labs are notable for acute transaminitis, including pattern that is concerning for cholestatic process.  Right upper quadrant ultrasound shows multiple gallstones, as well as a dilated common bile duct, in the absence of corresponding evidence of acute cholecystitis.  EDP contacted on-call unassigned [facility] (on-call at [facility]) requesting additional consultation.  Subsequently, I accepted this patient for transfer for inpatient admission to a med/tele bed at [facility] for further work-up and management of the above.     Update: on-call [facility] (Dr. Marca Ancona) conveys that the patient follows with a [facility] PCP, and asks that [facility] be consulted, if the patient is not already established with an outpatient gastroenterologist.   EDP at [facility] subsequently sent message to on-call [facility] gastroenterology requesting consultation in the morning.    Newton Pigg, DO Hospitalist

## 2022-12-21 NOTE — ED Provider Notes (Signed)
RUQ/epi AP - ?GB Low grade temps WBC 11. RUQ [place] pending - if negative - CT abd/pel  Physical Exam  BP 127/84   Pulse 90   Temp 99.6 F (37.6 C) (Oral)   Resp 16   Ht 6\' 2"  (1.88 m)   Wt 120.2 kg   SpO2 96%   BMI 34.02 kg/m   Physical Exam Vitals and nursing note reviewed.  Abdominal:     Tenderness: There is abdominal tenderness (mild, without rebound or guarding) in the right upper quadrant.     Procedures  Procedures  ED Course / MDM    Medical Decision Making Amount and/or Complexity of Data Reviewed Radiology: ordered.  Risk Prescription drug management.   Ultrasound results:   IMPRESSION: 1. Cholelithiasis without evidence of acute cholecystitis. 2. Dilated common bile duct. A stone in the distal common bile duct is not excluded. Recommend correlation with LFTs. If abnormal consider ERCP, EUS, or MRCP for further evaluation. 3. Cirrhotic liver morphology.  Re-exam: Still tender in RUQ, "better" per patient.   Patient and spouse updated on need for admission, further evaluation and treatment.   GI consulted [facility]) Hospitalist paged for admission - Dr. Arlean Hopping admitting to [facility]       Elpidio Anis, PA-C 12/21/22 2100    Arby Barrette, MD 12/21/22 [phone]

## 2022-12-21 NOTE — ED Triage Notes (Signed)
Abdominal pain with diarrhea since Saturday.  No emesis or fever.  Pt states he is sweating.   Pt states he has been drinking pedialyte but not eating well.

## 2022-12-21 NOTE — ED Notes (Signed)
Patient transported to [place] via WC.

## 2022-12-22 ENCOUNTER — Encounter (HOSPITAL_COMMUNITY): Payer: Self-pay | Admitting: Internal Medicine

## 2022-12-22 ENCOUNTER — Inpatient Hospital Stay (HOSPITAL_COMMUNITY): Payer: BC Managed Care – PPO

## 2022-12-22 DIAGNOSIS — K8689 Other specified diseases of pancreas: Secondary | ICD-10-CM

## 2022-12-22 DIAGNOSIS — K8070 Calculus of gallbladder and bile duct without cholecystitis without obstruction: Secondary | ICD-10-CM | POA: Diagnosis not present

## 2022-12-22 DIAGNOSIS — K838 Other specified diseases of biliary tract: Secondary | ICD-10-CM | POA: Diagnosis not present

## 2022-12-22 LAB — CBC
HCT: 41.2 % (ref 39.0–52.0)
Hemoglobin: 13.2 g/dL (ref 13.0–17.0)
MCH: 27.6 pg (ref 26.0–34.0)
MCHC: 32 g/dL (ref 30.0–36.0)
MCV: 86 fL (ref 80.0–100.0)
Platelets: 195 10*3/uL (ref 150–400)
RBC: 4.79 MIL/uL (ref 4.22–5.81)
RDW: 14.9 % (ref 11.5–15.5)
WBC: 8.6 10*3/uL (ref 4.0–10.5)
nRBC: 0 % (ref 0.0–0.2)

## 2022-12-22 LAB — HEPATITIS PANEL, ACUTE
HCV Ab: NONREACTIVE
Hep A IgM: NONREACTIVE
Hep B C IgM: NONREACTIVE
Hepatitis B Surface Ag: NONREACTIVE

## 2022-12-22 LAB — ACETAMINOPHEN LEVEL: Acetaminophen (Tylenol), Serum: <10 ug/mL — ABNORMAL LOW (ref 10–30)

## 2022-12-22 LAB — COMPREHENSIVE METABOLIC PANEL
ALT: 117 U/L — ABNORMAL HIGH (ref 0–44)
AST: 48 U/L — ABNORMAL HIGH (ref 15–41)
Albumin: 3.4 g/dL — ABNORMAL LOW (ref 3.5–5.0)
Alkaline Phosphatase: 115 U/L (ref 38–126)
Anion gap: 9 (ref 5–15)
BUN: 13 mg/dL (ref 6–20)
CO2: 22 mmol/L (ref 22–32)
Calcium: 8.6 mg/dL — ABNORMAL LOW (ref 8.9–10.3)
Chloride: 105 mmol/L (ref 98–111)
Creatinine, Ser: 1.01 mg/dL (ref 0.61–1.24)
GFR, Estimated: >60 mL/min (ref >60)
Glucose, Bld: 89 mg/dL (ref 70–99)
Potassium: 3.7 mmol/L (ref 3.5–5.1)
Sodium: 136 mmol/L (ref 135–145)
Total Bilirubin: 1.6 mg/dL — ABNORMAL HIGH (ref <1.2)
Total Protein: 6.7 g/dL (ref 6.5–8.1)

## 2022-12-22 LAB — HIV ANTIBODY (ROUTINE TESTING W REFLEX): HIV Screen 4th Generation wRfx: NONREACTIVE

## 2022-12-22 MED ORDER — LEVOTHYROXINE SODIUM 75 MCG PO TABS
75.0000 ug | ORAL_TABLET | Freq: Every day | ORAL | Status: DC
Start: 2022-12-22 — End: 2022-12-25
  Administered 2022-12-22 – 2022-12-25 (×3): 75 ug via ORAL
  Filled 2022-12-22 (×4): qty 1

## 2022-12-22 MED ORDER — TIZANIDINE HCL 4 MG PO TABS: 4.0000 mg | ORAL_TABLET | Freq: Four times a day (QID) | ORAL | Status: DC | PRN

## 2022-12-22 MED ORDER — ACETAMINOPHEN 650 MG RE SUPP: 650.0000 mg | Freq: Four times a day (QID) | RECTAL | Status: DC | PRN

## 2022-12-22 MED ORDER — ONDANSETRON HCL 4 MG/2ML IJ SOLN: 4.0000 mg | Freq: Four times a day (QID) | INTRAMUSCULAR | Status: DC | PRN

## 2022-12-22 MED ORDER — HYDROMORPHONE HCL 1 MG/ML IJ SOLN
0.5000 mg | INTRAMUSCULAR | Status: DC | PRN
  Administered 2022-12-22 (×3): 0.5 mg via INTRAVENOUS
  Filled 2022-12-22 (×3): qty 0.5

## 2022-12-22 MED ORDER — DULOXETINE HCL 60 MG PO CPEP
60.0000 mg | ORAL_CAPSULE | Freq: Every day | ORAL | Status: DC
  Administered 2022-12-22 – 2022-12-25 (×3): 60 mg via ORAL
  Filled 2022-12-22 (×3): qty 1

## 2022-12-22 MED ORDER — ONDANSETRON HCL 4 MG PO TABS: 4.0000 mg | ORAL_TABLET | Freq: Four times a day (QID) | ORAL | Status: DC | PRN

## 2022-12-22 MED ORDER — OXYCODONE HCL 5 MG PO TABS
5.0000 mg | ORAL_TABLET | ORAL | Status: DC | PRN
  Administered 2022-12-23 – 2022-12-24 (×4): 5 mg via ORAL
  Filled 2022-12-22 (×5): qty 1

## 2022-12-22 MED ORDER — GADOBUTROL 1 MMOL/ML IV SOLN
10.0000 mL | Freq: Once | INTRAVENOUS | Status: AC | PRN
  Administered 2022-12-22: 10 mL via INTRAVENOUS

## 2022-12-22 MED ORDER — SODIUM CHLORIDE 0.9% FLUSH
3.0000 mL | Freq: Two times a day (BID) | INTRAVENOUS | Status: DC
  Administered 2022-12-22 – 2022-12-23 (×3): 3 mL via INTRAVENOUS

## 2022-12-22 MED ORDER — ACETAMINOPHEN 325 MG PO TABS
650.0000 mg | ORAL_TABLET | Freq: Four times a day (QID) | ORAL | Status: DC | PRN
  Filled 2022-12-22: qty 2

## 2022-12-22 MED ORDER — HYDRALAZINE HCL 25 MG PO TABS: 25.0000 mg | ORAL_TABLET | Freq: Four times a day (QID) | ORAL | Status: DC | PRN

## 2022-12-22 MED ORDER — LORAZEPAM 2 MG/ML IJ SOLN: 1.0000 mg | Freq: Once | INTRAMUSCULAR | Status: DC | PRN

## 2022-12-22 MED ORDER — DOCUSATE SODIUM 100 MG PO CAPS
100.0000 mg | ORAL_CAPSULE | Freq: Two times a day (BID) | ORAL | Status: DC
Start: 2022-12-22 — End: 2022-12-25
  Administered 2022-12-24: 100 mg via ORAL
  Filled 2022-12-22 (×3): qty 1

## 2022-12-22 MED ORDER — IBUPROFEN 400 MG PO TABS: 400.0000 mg | ORAL_TABLET | Freq: Four times a day (QID) | ORAL | Status: DC | PRN

## 2022-12-22 NOTE — Progress Notes (Signed)
Pt is in route to have MRI done. He is being transported via wheelchair. He is warm, dry,no visible distress. Tele has been removed.

## 2022-12-22 NOTE — Consult Note (Addendum)
Consultation  Referring Provider:    [facility] Primary Care Physician:  Sharlene Dory, DO Primary Gastroenterologist:        Barron Alvine Reason for Consultation:     Choledocholithiasis     Impression / Plan:   Choledocholithiasis and cholelithiasis and diffuse biliary dilation  Mildly diffuse pancreatic duct dilation unclear etiology, question contribution of chronic alcohol use versus effects of choledocholithiasis  History of abnormal transaminases worked up at [facility], prior ultrasound 2022 raising question of cirrhosis with steatosis.  History of heavy alcohol use, reduced.  F 0-1 FibroScan January 2024 with 4.6K PA result and severe steatosis.  Multifactorial issues, question role of Lamisil alcohol.  Ultrasound this admission with "cirrhotic liver morphology".  Based upon [facility] liver evaluation and his MRI I do not think he has cirrhosis. --------------------------------------------------------------------------------------------------------  The patient needs an ERCP with biliary sphincterotomy and stone extraction.  I have explained the risks benefits and indications and that we will try to schedule this tomorrow, depending upon the availability of anesthesia support.  Continue current care plan.   Further discussion regarding alcohol use pending this, not broached in detail given family members present in the room today.  I think this has implications for liver and pancreas health.  Needs surgery consult for cholelithiasis  Iva Boop, MD, [facility] See Loretha Stapler on call - gastroenterology for best contact person 12/22/2022 11:30 AM   HPI:   Jadien Cugini is a 47 y.o. male with a history of gallstones and abnormal transaminases who developed the onset of epigastric pain several days ago, last weekend, this improved, had some transient diarrhea for a few days, and then had intensification of pain the day of admission.  He  presented to the med [facility] where ultrasound imaging demonstrated cholelithiasis and a 13 mm dilated common bile duct and "cirrhotic liver morphology".  MRI MRCP was done and as reflected below showed gallstones and choledocholithiasis.  The patient has noted some dark urine.  No nausea or vomiting or fever.  He is feeling somewhat better and tolerating clear liquids.  Denies dysphagia.  As mentioned in the assessment and plan history of heavier use of alcohol over the years with report of reduction in use.  His mother, his brother and his 57-year-old daughter are present in the room at the time of interview.  03/23/2022 [facility] clinic note assessment and plan Abnormal liver tests: Intermittently elevated aminotransferases dating back to 01/2020. Normal ALP/Tbili. Abd US done in 04/2020 with heterogeneous echotexture and nodular contour, suggesting cirrhosis. Platelets have been normal. He has metabolic risk factors for MASLD/NAFLD, including obesity, HTN, HLD. He has a history of heavy alcohol use for 30 years, with recent intermittent alcohol use (2days/week), raising concern for alcohol-related steatosis as well. He was recently started on Lipitor (~1 year ago) and has taken Lamisil PO over the past 6 months, both of which have been known to cause elevations in aminotransferases. Serologic evaluation was negative for chronic viral hepatitis, alpha 1 antitrypsin deficiency, Wilson disease, iron overload, autoimmune hepatitis. Fibroscan done on 02/22/2022 suggested minimal fibrosis (E=4.6 kPa, F0-1) with CAP 318. Suspect combination of alcohol-related liver injury, MASLD and medication-related injury (Lamisil, Lipitor) as causes for elevated liver chemistries. Latest labs with normal liver enzymes, which we will repeat today. He has cut back significantly on his alcohol intake and recommended max of 2 drinks/day, which he has been doing (or less). He can follow up in hepatology clinic as  needed, if  liver enzymes increase in the future or if new concerns for progressing/advanced liver disease arise.     AST 30 ALT 48 bilirubin 0.5 and alk phos 65 on that date  colonoscopy 06/12/2020-3 mm rectal hyperplastic polyp and diverticulosis recall 2032   Past Medical History:  Diagnosis Date   Chicken pox as a child   Fatty liver    Grief reaction 04/04/2013   HTN (hypertension) 04/04/2013   PTSD (post-traumatic stress disorder)    Unspecified hypothyroidism 04/07/2013    Past Surgical History:  Procedure Laterality Date   COLONOSCOPY  02/2020   FOOT SURGERY     x4- plantar fibroma, screw in first metatarsal- both feet   HYDROCELE EXCISION / REPAIR  47 years old   WISDOM TOOTH EXTRACTION  2010    Family History  Problem Relation Age of Onset   Congestive Heart Failure Father    Hyperlipidemia Father    Hypertension Father    Kidney disease Maternal Grandmother    Diabetes Maternal Grandmother        type 2   Stroke Maternal Grandmother    Alcohol abuse Paternal Grandmother    Pneumonia Paternal Grandfather    Asthma Brother    Breast cancer Neg Hx    Colon cancer Neg Hx    Esophageal cancer Neg Hx    Stomach cancer Neg Hx    Rectal cancer Neg Hx     Social History   Tobacco Use   Smoking status: Every Day    Current packs/day: 0.50    Average packs/day: 0.5 packs/day for 25.0 years (12.5 ttl pk-yrs)    Types: Cigarettes   Smokeless tobacco: Never   Tobacco comments:    quit in Nov 2014 but started back 03-18-13  Vaping Use   Vaping status: Never Used  Substance Use Topics   Alcohol use: Yes    Comment: occasionally   Drug use: No    Prior to Admission medications   Medication Sig Start Date End Date Taking? Authorizing Provider  celecoxib (CELEBREX) 200 MG capsule Take 1 capsule (200 mg total) by mouth 2 (two) times daily. 05/03/22   Hyatt, Max T, DPM  DULoxetine (CYMBALTA) 60 MG capsule Take 1 capsule (60 mg total) by mouth daily. 11/29/22   Sharlene Dory, DO  fenofibrate (TRICOR) 48 MG tablet Take 1 tablet (48 mg total) by mouth daily. 11/29/22   Sharlene Dory, DO  levothyroxine (SYNTHROID) 75 MCG tablet Take 1 tablet (75 mcg total) by mouth daily. 11/29/22   Sharlene Dory, DO  olmesartan-hydrochlorothiazide (BENICAR HCT) 40-25 MG tablet Take 1 tablet by mouth daily. 11/29/22   Sharlene Dory, DO  pravastatin (PRAVACHOL) 40 MG tablet Take 1 tablet (40 mg total) by mouth daily. 11/29/22   Sharlene Dory, DO  sildenafil (VIAGRA) 100 MG tablet Take 1/2-1 tablets (50-100 mg total) by mouth daily as needed for erectile dysfunction. 11/30/21   Wendling, Jilda Roche, DO  tiZANidine (ZANAFLEX) 4 MG tablet Take 1 tablet (4 mg total) by mouth every 6 (six) hours as needed for muscle spasms. 11/29/22   Sharlene Dory, DO  hydrochlorothiazide (HYDRODIURIL) 25 MG tablet Take 1 tablet (25 mg total) by mouth daily. 08/21/19 04/20/20  Sharlene Dory, DO    Current Facility-Administered Medications  Medication Dose Route Frequency Provider Last Rate Last Admin   acetaminophen (TYLENOL) tablet 650 mg  650 mg Oral Q6H PRN Rolly Salter, MD  Or   acetaminophen (TYLENOL) suppository 650 mg  650 mg Rectal Q6H PRN Rolly Salter, MD       docusate sodium (COLACE) capsule 100 mg  100 mg Oral BID Rolly Salter, MD       DULoxetine (CYMBALTA) DR capsule 60 mg  60 mg Oral Daily Opyd, Lavone Neri, MD   60 mg at 12/22/22 1010   hydrALAZINE (APRESOLINE) tablet 25 mg  25 mg Oral Q6H PRN Opyd, Lavone Neri, MD       HYDROmorphone (DILAUDID) injection 0.5 mg  0.5 mg Intravenous Q3H PRN Opyd, Lavone Neri, MD   0.5 mg at 12/22/22 0818   levothyroxine (SYNTHROID) tablet 75 mcg  75 mcg Oral Q0600 Briscoe Deutscher, MD   75 mcg at 12/22/22 0518   LORazepam (ATIVAN) injection 1 mg  1 mg Intravenous Once PRN Opyd, Lavone Neri, MD       ondansetron (ZOFRAN) tablet 4 mg  4 mg Oral Q6H PRN Opyd, Lavone Neri, MD       Or    ondansetron (ZOFRAN) injection 4 mg  4 mg Intravenous Q6H PRN Opyd, Lavone Neri, MD       oxyCODONE (Oxy IR/ROXICODONE) immediate release tablet 5 mg  5 mg Oral Q4H PRN Opyd, Lavone Neri, MD       sodium chloride flush (NS) 0.9 % injection 3 mL  3 mL Intravenous Q12H Opyd, Lavone Neri, MD   3 mL at 12/22/22 0243   tiZANidine (ZANAFLEX) tablet 4 mg  4 mg Oral Q6H PRN Opyd, Lavone Neri, MD        Allergies as of 12/21/2022   (No Known Allergies)     Review of Systems:    This is positive for those things mentioned in the HPI. All other review of systems are negative.       Physical Exam:  Vital signs in last 24 hours: Temp:  [97.7 F (36.5 C)-100 F (37.8 C)] 97.7 F (36.5 C) (11/28 1004) Pulse Rate:  [68-107] 68 (11/28 1004) Resp:  [16-21] 16 (11/28 1004) BP: (120-148)/(73-88) 133/80 (11/28 1004) SpO2:  [93 %-97 %] 93 % (11/28 1004) Weight:  [120.2 kg] 120.2 kg (11/27 1514) Last BM Date : 12/20/22  General:  Well-developed, well-nourished and in no acute distress Eyes:  anicteric. ENT:   Mouth and posterior pharynx free of lesions.    Lungs: Clear to auscultation bilaterally. Heart:   S1S2, no rubs, murmurs, gallops. Abdomen:  soft, mildly tender epigastrium, no hepatosplenomegaly, hernia, or mass and BS+.  Extremities:   no edema Skin   no rash. Neuro:  A&O x 3.  Psych:  appropriate mood and  Affect.   Data Reviewed:   LAB RESULTS: Recent Labs    12/21/22 1522 12/21/22 1537 12/22/22 0509  WBC 11.5*  --  8.6  HGB 14.6 16.0 13.2  HCT 43.3 47.0 41.2  PLT 220  --  195   BMET Recent Labs    12/21/22 1537 12/22/22 0509  NA 137 136  K 4.0 3.7  CL 103 105  CO2  --  22  GLUCOSE 106* 89  BUN 10 13  CREATININE 1.20 1.01  CALCIUM  --  8.6*   LFT Recent Labs    12/21/22 1522 12/22/22 0509  PROT 7.7 6.7  ALBUMIN 3.9 3.4*  AST 63* 48*  ALT 147* 117*  ALKPHOS 155* 115  BILITOT 1.3* 1.6*  BILIDIR 0.7*  --   IBILI 0.6  --  Lab Results  Component Value  Date   LIPASE 44 12/21/2022    PT/INR No results for input(s): "LABPROT", "INR" in the last 72 hours.  STUDIES: Images reviewed by me MR ABDOMEN MRCP W WO CONTAST  Result Date: 12/22/2022 CLINICAL DATA:  Right upper quadrant pain for 5 days. Cholelithiasis and biliary ductal dilatation on recent ultrasound. EXAM: MRI ABDOMEN WITHOUT AND WITH CONTRAST (INCLUDING MRCP) TECHNIQUE: Multiplanar multisequence MR imaging of the abdomen was performed both before and after the administration of intravenous contrast. Heavily T2-weighted images of the biliary and pancreatic ducts were obtained, and three-dimensional MRCP images were rendered by post processing. CONTRAST:  10mL GADAVIST GADOBUTROL 1 MMOL/ML IV SOLN COMPARISON:  None Available. FINDINGS: Lower chest: No acute findings. Hepatobiliary: No hepatic masses identified. A few large gallstones are seen measuring up to 2.5 cm. No evidence of acute cholecystitis. Diffuse biliary ductal dilatation is seen with common bile duct measuring 11 mm in diameter. A 10 mm calculus is seen in the distal common bile duct, as well as sludge or other tiny calculi. Pancreas: No mass or inflammatory changes seen. Mild diffuse pancreatic ductal dilatation is noted, without signs of pancreas divisum. Spleen:  Within normal limits in size and appearance. Adrenals/Urinary Tract: No suspicious masses identified. No evidence of hydronephrosis. Stomach/Bowel: Unremarkable. Vascular/Lymphatic: No pathologically enlarged lymph nodes identified. No acute vascular findings. Other:  None. Musculoskeletal:  No suspicious bone lesions identified. IMPRESSION: Cholelithiasis. No radiographic evidence of acute cholecystitis. Diffuse biliary ductal dilatation, with 10 mm calculus and sludge in distal common bile duct. Mild diffuse pancreatic ductal dilatation. No radiographic evidence of pancreatic mass or acute pancreatitis. Electronically Signed   By: Danae Orleans M.D.   On: 12/22/2022 09:24    MR 3D Recon At Scanner  Result Date: 12/22/2022 CLINICAL DATA:  Right upper quadrant pain for 5 days. Cholelithiasis and biliary ductal dilatation on recent ultrasound. EXAM: MRI ABDOMEN WITHOUT AND WITH CONTRAST (INCLUDING MRCP) TECHNIQUE: Multiplanar multisequence MR imaging of the abdomen was performed both before and after the administration of intravenous contrast. Heavily T2-weighted images of the biliary and pancreatic ducts were obtained, and three-dimensional MRCP images were rendered by post processing. CONTRAST:  10mL GADAVIST GADOBUTROL 1 MMOL/ML IV SOLN COMPARISON:  None Available. FINDINGS: Lower chest: No acute findings. Hepatobiliary: No hepatic masses identified. A few large gallstones are seen measuring up to 2.5 cm. No evidence of acute cholecystitis. Diffuse biliary ductal dilatation is seen with common bile duct measuring 11 mm in diameter. A 10 mm calculus is seen in the distal common bile duct, as well as sludge or other tiny calculi. Pancreas: No mass or inflammatory changes seen. Mild diffuse pancreatic ductal dilatation is noted, without signs of pancreas divisum. Spleen:  Within normal limits in size and appearance. Adrenals/Urinary Tract: No suspicious masses identified. No evidence of hydronephrosis. Stomach/Bowel: Unremarkable. Vascular/Lymphatic: No pathologically enlarged lymph nodes identified. No acute vascular findings. Other:  None. Musculoskeletal:  No suspicious bone lesions identified. IMPRESSION: Cholelithiasis. No radiographic evidence of acute cholecystitis. Diffuse biliary ductal dilatation, with 10 mm calculus and sludge in distal common bile duct. Mild diffuse pancreatic ductal dilatation. No radiographic evidence of pancreatic mass or acute pancreatitis. Electronically Signed   By: Danae Orleans M.D.   On: 12/22/2022 09:24   US Abdomen Limited RUQ (LIVER/GB)  Result Date: 12/21/2022 CLINICAL DATA:  Right upper quadrant pain EXAM: ULTRASOUND ABDOMEN LIMITED  RIGHT UPPER QUADRANT COMPARISON:  Ultrasound 05/08/2020 FINDINGS: Gallbladder: Contracted gallbladder with large 2.8 cm stone.  No gallbladder wall thickening. No pericholecystic fluid. Indeterminate sonographic Murphy's sign due to pain medications. Common bile duct: Diameter: 13 mm. Liver: No focal lesion identified. Heterogenous parenchymal echotexture. Mild contour nodularity. Portal vein is patent on color Doppler imaging with normal direction of blood flow towards the liver. Other: None. IMPRESSION: 1. Cholelithiasis without evidence of acute cholecystitis. 2. Dilated common bile duct. A stone in the distal common bile duct is not excluded. Recommend correlation with LFTs. If abnormal consider ERCP, EUS, or MRCP for further evaluation. 3. Cirrhotic liver morphology. Electronically Signed   By: Minerva Fester M.D.   On: 12/21/2022 19:18     PREVIOUS ENDOSCOPIES:            See HPI   Thanks   LOS: 1 day   @Tayelor Osborne  Sena Slate, MD, [facility] @  12/22/2022, 11:22 AM

## 2022-12-22 NOTE — H&P (Signed)
History and Physical    Tony Walters WUJ:[id number] DOB: 04/22/1975 DOA: 12/21/2022  PCP: Sharlene Dory, DO   Patient coming from: Home   Chief Complaint: Epigastric pain   HPI: Tony Walters is a 47 y.o. male with medical history significant for hypertension, hypothyroidism, hyperlipidemia, prior heavy alcohol use, and severe hepatic steatosis who presents with epigastric pain.  Patient reports that he developed severe epigastric pain on 12/17/2022.  This was accompanied by severe nausea, but no vomiting.  Pain has been fluctuating since but he is unable to identify any alleviating or exacerbating factors.  There was some loose stools associated with this but no BM since 12/20/2022.  He has cut back on his alcohol use significantly but continues to drink 0-3 alcoholic beverages daily.  [facility] ED Course: Upon arrival to the ED, patient is found to be afebrile and saturating mid 90s on room air with stable blood pressure.  Labs are most notable for alkaline phosphatase 155, AST 63, ALT 147, normal bilirubin, and WBC 11,500.  Ultrasound is notable for cholelithiasis and 13 mm CBD.  [facility] was consulted by the ED PA and the patient was treated with a liter of saline, 2 doses of Dilaudid, and Zofran.  He was transferred to [facility] for admission.  Review of Systems:  All other systems reviewed and apart from HPI, are negative.  Past Medical History:  Diagnosis Date   Chicken pox as a child   Grief reaction 04/04/2013   HTN (hypertension) 04/04/2013   PTSD (post-traumatic stress disorder)    Unspecified hypothyroidism 04/07/2013    Past Surgical History:  Procedure Laterality Date   FOOT SURGERY     x4- plantar fibroma, screw in first metatarsal- both feet   HYDROCELE EXCISION / REPAIR  47 years old   WISDOM TOOTH EXTRACTION  2010    Social History:   reports that he has been smoking cigarettes. He has a 12.5 pack-year smoking history. He has never  used smokeless tobacco. He reports current alcohol use. He reports that he does not use drugs.  No Known Allergies  Family History  Problem Relation Age of Onset   Congestive Heart Failure Father    Hyperlipidemia Father    Hypertension Father    Kidney disease Maternal Grandmother    Diabetes Maternal Grandmother        type 2   Stroke Maternal Grandmother    Alcohol abuse Paternal Grandmother    Pneumonia Paternal Grandfather    Asthma Brother    Breast cancer Neg Hx    Colon cancer Neg Hx    Esophageal cancer Neg Hx    Stomach cancer Neg Hx    Rectal cancer Neg Hx      Prior to Admission medications   Medication Sig Start Date End Date Taking? Authorizing Provider  celecoxib (CELEBREX) 200 MG capsule Take 1 capsule (200 mg total) by mouth 2 (two) times daily. 05/03/22   Hyatt, Max T, DPM  DULoxetine (CYMBALTA) 60 MG capsule Take 1 capsule (60 mg total) by mouth daily. 11/29/22   Sharlene Dory, DO  fenofibrate (TRICOR) 48 MG tablet Take 1 tablet (48 mg total) by mouth daily. 11/29/22   Sharlene Dory, DO  levothyroxine (SYNTHROID) 75 MCG tablet Take 1 tablet (75 mcg total) by mouth daily. 11/29/22   Sharlene Dory, DO  olmesartan-hydrochlorothiazide (BENICAR HCT) 40-25 MG tablet Take 1 tablet by mouth daily. 11/29/22   Sharlene Dory, DO  pravastatin (PRAVACHOL) 40 MG tablet Take 1 tablet (40 mg total) by mouth daily. 11/29/22   Sharlene Dory, DO  sildenafil (VIAGRA) 100 MG tablet Take 1/2-1 tablets (50-100 mg total) by mouth daily as needed for erectile dysfunction. 11/30/21   Wendling, Jilda Roche, DO  tiZANidine (ZANAFLEX) 4 MG tablet Take 1 tablet (4 mg total) by mouth every 6 (six) hours as needed for muscle spasms. 11/29/22   Sharlene Dory, DO  hydrochlorothiazide (HYDRODIURIL) 25 MG tablet Take 1 tablet (25 mg total) by mouth daily. 08/21/19 04/20/20  Sharlene Dory, DO    Physical Exam: Vitals:   12/21/22 2029  12/21/22 2115 12/21/22 2238 12/22/22 0248  BP:  120/77 (!) 143/75 (!) 144/83  Pulse:  77 79 77  Resp:  (!) 21 16 18   Temp: 99.6 F (37.6 C)  99.8 F (37.7 C) 98.9 F (37.2 C)  TempSrc: Oral  Oral Oral  SpO2:  93% 93% 93%  Weight:      Height:        Constitutional: NAD, no pallor or diaphoresis  Eyes: PERTLA, lids and conjunctivae normal ENMT: Mucous membranes are moist. Posterior pharynx clear of any exudate or lesions.   Neck: supple, no masses  Respiratory: no wheezing, no crackles. No accessory muscle use.  Cardiovascular: S1 & S2 heard, regular rate and rhythm. No extremity edema.   Abdomen: Soft, no distension, tender in epigastrium. Bowel sounds active.  Musculoskeletal: no clubbing / cyanosis. No joint deformity upper and lower extremities.   Skin: no significant rashes, lesions, ulcers. Warm, dry, well-perfused. Neurologic: CN 2-12 grossly intact. Moving all extremities. Alert and oriented.  Psychiatric: Calm. Cooperative.    Labs and Imaging on Admission: I have personally reviewed following labs and imaging studies  CBC: Recent Labs  Lab 12/16/22 1048 12/21/22 1522 12/21/22 1537  WBC 12.3* 11.5*  --   HGB 15.4 14.6 16.0  HCT 48.0 43.3 47.0  MCV 86.5 81.9  --   PLT 279.0 220  --    Basic Metabolic Panel: Recent Labs  Lab 12/16/22 1048 12/21/22 1537  NA 139 137  K 4.5 4.0  CL 100 103  CO2 32  --   GLUCOSE 81 106*  BUN 14 10  CREATININE 1.18 1.20  CALCIUM 10.3  --    GFR: Estimated Creatinine Clearance: 102.6 mL/min (by C-G formula based on SCr of 1.2 mg/dL). Liver Function Tests: Recent Labs  Lab 12/16/22 1048 12/21/22 1522  AST 27 63*  ALT 29 147*  ALKPHOS 67 155*  BILITOT 0.5 1.3*  PROT 7.4 7.7  ALBUMIN 4.5 3.9   Recent Labs  Lab 12/21/22 1522  LIPASE 44   No results for input(s): "AMMONIA" in the last 168 hours. Coagulation Profile: No results for input(s): "INR", "PROTIME" in the last 168 hours. Cardiac Enzymes: No results for  input(s): "CKTOTAL", "CKMB", "CKMBINDEX", "TROPONINI" in the last 168 hours. BNP (last 3 results) No results for input(s): "PROBNP" in the last 8760 hours. HbA1C: No results for input(s): "HGBA1C" in the last 72 hours. CBG: Recent Labs  Lab 12/21/22 1511  GLUCAP 125*   Lipid Profile: No results for input(s): "CHOL", "HDL", "LDLCALC", "TRIG", "CHOLHDL", "LDLDIRECT" in the last 72 hours. Thyroid Function Tests: No results for input(s): "TSH", "T4TOTAL", "FREET4", "T3FREE", "THYROIDAB" in the last 72 hours. Anemia Panel: No results for input(s): "VITAMINB12", "FOLATE", "FERRITIN", "TIBC", "IRON", "RETICCTPCT" in the last 72 hours. Urine analysis:    Component Value Date/Time   COLORURINE YELLOW 12/21/2022  1522   APPEARANCEUR CLEAR 12/21/2022 1522   LABSPEC 1.015 12/21/2022 1522   PHURINE 6.5 12/21/2022 1522   GLUCOSEU NEGATIVE 12/21/2022 1522   HGBUR SMALL (A) 12/21/2022 1522   BILIRUBINUR SMALL (A) 12/21/2022 1522   KETONESUR NEGATIVE 12/21/2022 1522   PROTEINUR NEGATIVE 12/21/2022 1522   UROBILINOGEN 0.2 03/17/2014 0715   NITRITE NEGATIVE 12/21/2022 1522   LEUKOCYTESUR NEGATIVE 12/21/2022 1522   Sepsis Labs: @LABRCNTIP (procalcitonin:4,lacticidven:4) )No results found for this or any previous visit (from the past 240 hour(s)).   Radiological Exams on Admission: US Abdomen Limited RUQ (LIVER/GB)  Result Date: 12/21/2022 CLINICAL DATA:  Right upper quadrant pain EXAM: ULTRASOUND ABDOMEN LIMITED RIGHT UPPER QUADRANT COMPARISON:  Ultrasound 05/08/2020 FINDINGS: Gallbladder: Contracted gallbladder with large 2.8 cm stone. No gallbladder wall thickening. No pericholecystic fluid. Indeterminate sonographic Murphy's sign due to pain medications. Common bile duct: Diameter: 13 mm. Liver: No focal lesion identified. Heterogenous parenchymal echotexture. Mild contour nodularity. Portal vein is patent on color Doppler imaging with normal direction of blood flow towards the liver. Other:  None. IMPRESSION: 1. Cholelithiasis without evidence of acute cholecystitis. 2. Dilated common bile duct. A stone in the distal common bile duct is not excluded. Recommend correlation with LFTs. If abnormal consider ERCP, EUS, or MRCP for further evaluation. 3. Cirrhotic liver morphology. Electronically Signed   By: Minerva Fester M.D.   On: 12/21/2022 19:18    EKG: Independently reviewed. Sinus tachycardia, rate 104.   Assessment/Plan  1. CBD dilation  - Presents with 4 days epigastric pain and found to have AST 63, ALT 147, normal bili, and CBD diameter of 13 mm on [place] (was 6 mm in 2022)  - Lipase is normal and there is no fever or leukocytosis  - Bowel rest, pain-control, check MRCP, hold statin for now, trend LFTs   2. Hypothyroidism  - Synthroid    3. Depression  - Cymbalta   4. HTN  - Treat as-needed only for now    5. Severe steatosis, ?cirrhosis  - Liver morphology appears cirrhotic on imaging - Had workup at [facility] and was attributed to hx of heavy alcohol use - Strict alcohol avoidance and GI follow-up advised    DVT prophylaxis: SCDs  Code Status: Full  Level of Care: Level of care: Med-Surg Family Communication: Wife at bedside  Disposition Plan:  Patient is from: home  Anticipated d/c is to: Home  Anticipated d/c date is: 12/25/22 Patient currently: Pending MRCP, GI consultation  Consults called: [facility]  Admission status: Inpatient     Briscoe Deutscher, MD [facility]  12/22/2022, 3:44 AM

## 2022-12-22 NOTE — Progress Notes (Signed)
[facility] Progress Note Patient: Tony Walters JXB:[id number] DOB: [birth year] DOA: 12/21/2022  DOS: the patient was seen and examined on 12/22/2022  Brief hospital course:  Tony Walters is a 47 y.o. male with medical history significant for hypertension, hypothyroidism, hyperlipidemia, prior heavy alcohol use, and severe hepatic steatosis who presents with epigastric pain.  Found to have choledocholithiasis with biliary colic.  Currently GI consulted.  Plans for ERCP. Assessment and Plan: Biliary colic with choledocholithiasis. Presented with epigastric pain. AST ALT elevated. CBD dilated. Lipase level normal. Bilirubin mildly elevated but improving. [place] [facility] consulted. ERCP scheduled. Will monitor.  Hypothyroidism. Continue Synthroid.  Depression. Continue Cymbalta.  Blood pressure. Currently stable.  Prior history of steatosis. Reported history of alcohol abuse but currently does not drink heavily. For now monitoring. Has seen hepatology outpatient.  Obesity Class 1 Body mass index is 34.02 kg/m.  Placing the pt at higher risk of poor outcomes.  Subjective: No nausea no vomiting no fever no chills.  Abdominal pain still present.  Physical Exam: General: in Mild distress, No Rash Cardiovascular: S1 and S2 Present, No Murmur Respiratory: Good respiratory effort, Bilateral Air entry present. No Crackles, No wheezes Abdomen: Bowel Sound present, epigastric tenderness Extremities: No edema Neuro: Alert and oriented x3, no new focal deficit  Data Reviewed: I have Reviewed nursing notes, Vitals, and Lab results. Since last encounter, pertinent lab results CBC and CMP   . I have ordered test including CBC and CMP  . I have discussed pt's care plan and test results with [facility]  .   Disposition: Status is: Inpatient Remains inpatient appropriate because: Monitor for improvement in pain and course after ERCP  SCDs Start: 12/22/22 0136   Family  Communication: No one at bedside Level of care: Med-Surg   Vitals:   12/21/22 2238 12/22/22 0248 12/22/22 0601 12/22/22 1004  BP: (!) 143/75 (!) 144/83 137/73 133/80  Pulse: 79 77 72 68  Resp: 16 18 18 16   Temp: 99.8 F (37.7 C) 98.9 F (37.2 C) 97.9 F (36.6 C) 97.7 F (36.5 C)  TempSrc: Oral Oral Oral Oral  SpO2: 93% 93% 97% 93%  Weight:      Height:         Author: Lynden Oxford, MD 12/22/2022 7:35 PM  Please look on [email or web address] to find out who is on call.

## 2022-12-22 NOTE — Plan of Care (Signed)

## 2022-12-22 NOTE — H&P (View-Only) (Signed)
 Consultation  Referring Provider:    [facility] Primary Care Physician:  Sharlene Dory, DO Primary Gastroenterologist:        Barron Alvine Reason for Consultation:     Choledocholithiasis     Impression / Plan:   Choledocholithiasis and cholelithiasis and diffuse biliary dilation  Mildly diffuse pancreatic duct dilation unclear etiology, question contribution of chronic alcohol use versus effects of choledocholithiasis  History of abnormal transaminases worked up at [facility], prior ultrasound 2022 raising question of cirrhosis with steatosis.  History of heavy alcohol use, reduced.  F 0-1 FibroScan January 2024 with 4.6K PA result and severe steatosis.  Multifactorial issues, question role of Lamisil alcohol.  Ultrasound this admission with "cirrhotic liver morphology".  Based upon [facility] liver evaluation and his MRI I do not think he has cirrhosis. --------------------------------------------------------------------------------------------------------  The patient needs an ERCP with biliary sphincterotomy and stone extraction.  I have explained the risks benefits and indications and that we will try to schedule this tomorrow, depending upon the availability of anesthesia support.  Continue current care plan.   Further discussion regarding alcohol use pending this, not broached in detail given family members present in the room today.  I think this has implications for liver and pancreas health.  Needs surgery consult for cholelithiasis  Iva Boop, MD, [facility] See Loretha Stapler on call - gastroenterology for best contact person 12/22/2022 11:30 AM   HPI:   Tony Walters is a 47 y.o. male with a history of gallstones and abnormal transaminases who developed the onset of epigastric pain several days ago, last weekend, this improved, had some transient diarrhea for a few days, and then had intensification of pain the day of admission.  He  presented to the med [facility] where ultrasound imaging demonstrated cholelithiasis and a 13 mm dilated common bile duct and "cirrhotic liver morphology".  MRI MRCP was done and as reflected below showed gallstones and choledocholithiasis.  The patient has noted some dark urine.  No nausea or vomiting or fever.  He is feeling somewhat better and tolerating clear liquids.  Denies dysphagia.  As mentioned in the assessment and plan history of heavier use of alcohol over the years with report of reduction in use.  His mother, his brother and his 57-year-old daughter are present in the room at the time of interview.  03/23/2022 [facility] liver clinic note assessment and plan Abnormal liver tests: Intermittently elevated aminotransferases dating back to 01/2020. Normal ALP/Tbili. Abd US done in 04/2020 with heterogeneous echotexture and nodular contour, suggesting cirrhosis. Platelets have been normal. He has metabolic risk factors for MASLD/NAFLD, including obesity, HTN, HLD. He has a history of heavy alcohol use for 30 years, with recent intermittent alcohol use (2days/week), raising concern for alcohol-related steatosis as well. He was recently started on Lipitor (~1 year ago) and has taken Lamisil PO over the past 6 months, both of which have been known to cause elevations in aminotransferases. Serologic evaluation was negative for chronic viral hepatitis, alpha 1 antitrypsin deficiency, Wilson disease, iron overload, autoimmune hepatitis. Fibroscan done on 02/22/2022 suggested minimal fibrosis (E=4.6 kPa, F0-1) with CAP 318. Suspect combination of alcohol-related liver injury, MASLD and medication-related injury (Lamisil, Lipitor) as causes for elevated liver chemistries. Latest labs with normal liver enzymes, which we will repeat today. He has cut back significantly on his alcohol intake and recommended max of 2 drinks/day, which he has been doing (or less). He can follow up in hepatology clinic as  needed, if  liver enzymes increase in the future or if new concerns for progressing/advanced liver disease arise.     AST 30 ALT 48 bilirubin 0.5 and alk phos 65 on that date  colonoscopy 06/12/2020-3 mm rectal hyperplastic polyp and diverticulosis recall 2032   Past Medical History:  Diagnosis Date   Chicken pox as a child   Fatty liver    Grief reaction 04/04/2013   HTN (hypertension) 04/04/2013   PTSD (post-traumatic stress disorder)    Unspecified hypothyroidism 04/07/2013    Past Surgical History:  Procedure Laterality Date   COLONOSCOPY  02/2020   FOOT SURGERY     x4- plantar fibroma, screw in first metatarsal- both feet   HYDROCELE EXCISION / REPAIR  47 years old   WISDOM TOOTH EXTRACTION  2010    Family History  Problem Relation Age of Onset   Congestive Heart Failure Father    Hyperlipidemia Father    Hypertension Father    Kidney disease Maternal Grandmother    Diabetes Maternal Grandmother        type 2   Stroke Maternal Grandmother    Alcohol abuse Paternal Grandmother    Pneumonia Paternal Grandfather    Asthma Brother    Breast cancer Neg Hx    Colon cancer Neg Hx    Esophageal cancer Neg Hx    Stomach cancer Neg Hx    Rectal cancer Neg Hx     Social History   Tobacco Use   Smoking status: Every Day    Current packs/day: 0.50    Average packs/day: 0.5 packs/day for 25.0 years (12.5 ttl pk-yrs)    Types: Cigarettes   Smokeless tobacco: Never   Tobacco comments:    quit in Nov 2014 but started back 03-18-13  Vaping Use   Vaping status: Never Used  Substance Use Topics   Alcohol use: Yes    Comment: occasionally   Drug use: No    Prior to Admission medications   Medication Sig Start Date End Date Taking? Authorizing Provider  celecoxib (CELEBREX) 200 MG capsule Take 1 capsule (200 mg total) by mouth 2 (two) times daily. 05/03/22   Hyatt, Max T, DPM  DULoxetine (CYMBALTA) 60 MG capsule Take 1 capsule (60 mg total) by mouth daily. 11/29/22   Sharlene Dory, DO  fenofibrate (TRICOR) 48 MG tablet Take 1 tablet (48 mg total) by mouth daily. 11/29/22   Sharlene Dory, DO  levothyroxine (SYNTHROID) 75 MCG tablet Take 1 tablet (75 mcg total) by mouth daily. 11/29/22   Sharlene Dory, DO  olmesartan-hydrochlorothiazide (BENICAR HCT) 40-25 MG tablet Take 1 tablet by mouth daily. 11/29/22   Sharlene Dory, DO  pravastatin (PRAVACHOL) 40 MG tablet Take 1 tablet (40 mg total) by mouth daily. 11/29/22   Sharlene Dory, DO  sildenafil (VIAGRA) 100 MG tablet Take 1/2-1 tablets (50-100 mg total) by mouth daily as needed for erectile dysfunction. 11/30/21   Wendling, Jilda Roche, DO  tiZANidine (ZANAFLEX) 4 MG tablet Take 1 tablet (4 mg total) by mouth every 6 (six) hours as needed for muscle spasms. 11/29/22   Sharlene Dory, DO  hydrochlorothiazide (HYDRODIURIL) 25 MG tablet Take 1 tablet (25 mg total) by mouth daily. 08/21/19 04/20/20  Sharlene Dory, DO    Current Facility-Administered Medications  Medication Dose Route Frequency Provider Last Rate Last Admin   acetaminophen (TYLENOL) tablet 650 mg  650 mg Oral Q6H PRN Rolly Salter, MD  Or   acetaminophen (TYLENOL) suppository 650 mg  650 mg Rectal Q6H PRN Rolly Salter, MD       docusate sodium (COLACE) capsule 100 mg  100 mg Oral BID Rolly Salter, MD       DULoxetine (CYMBALTA) DR capsule 60 mg  60 mg Oral Daily Opyd, Lavone Neri, MD   60 mg at 12/22/22 1010   hydrALAZINE (APRESOLINE) tablet 25 mg  25 mg Oral Q6H PRN Opyd, Lavone Neri, MD       HYDROmorphone (DILAUDID) injection 0.5 mg  0.5 mg Intravenous Q3H PRN Opyd, Lavone Neri, MD   0.5 mg at 12/22/22 0818   levothyroxine (SYNTHROID) tablet 75 mcg  75 mcg Oral Q0600 Briscoe Deutscher, MD   75 mcg at 12/22/22 0518   LORazepam (ATIVAN) injection 1 mg  1 mg Intravenous Once PRN Opyd, Lavone Neri, MD       ondansetron (ZOFRAN) tablet 4 mg  4 mg Oral Q6H PRN Opyd, Lavone Neri, MD       Or    ondansetron (ZOFRAN) injection 4 mg  4 mg Intravenous Q6H PRN Opyd, Lavone Neri, MD       oxyCODONE (Oxy IR/ROXICODONE) immediate release tablet 5 mg  5 mg Oral Q4H PRN Opyd, Lavone Neri, MD       sodium chloride flush (NS) 0.9 % injection 3 mL  3 mL Intravenous Q12H Opyd, Lavone Neri, MD   3 mL at 12/22/22 0243   tiZANidine (ZANAFLEX) tablet 4 mg  4 mg Oral Q6H PRN Opyd, Lavone Neri, MD        Allergies as of 12/21/2022   (No Known Allergies)     Review of Systems:    This is positive for those things mentioned in the HPI. All other review of systems are negative.       Physical Exam:  Vital signs in last 24 hours: Temp:  [97.7 F (36.5 C)-100 F (37.8 C)] 97.7 F (36.5 C) (11/28 1004) Pulse Rate:  [68-107] 68 (11/28 1004) Resp:  [16-21] 16 (11/28 1004) BP: (120-148)/(73-88) 133/80 (11/28 1004) SpO2:  [93 %-97 %] 93 % (11/28 1004) Weight:  [120.2 kg] 120.2 kg (11/27 1514) Last BM Date : 12/20/22  General:  Well-developed, well-nourished and in no acute distress Eyes:  anicteric. ENT:   Mouth and posterior pharynx free of lesions.    Lungs: Clear to auscultation bilaterally. Heart:   S1S2, no rubs, murmurs, gallops. Abdomen:  soft, mildly tender epigastrium, no hepatosplenomegaly, hernia, or mass and BS+.  Extremities:   no edema Skin   no rash. Neuro:  A&O x 3.  Psych:  appropriate mood and  Affect.   Data Reviewed:   LAB RESULTS: Recent Labs    12/21/22 1522 12/21/22 1537 12/22/22 0509  WBC 11.5*  --  8.6  HGB 14.6 16.0 13.2  HCT 43.3 47.0 41.2  PLT 220  --  195   BMET Recent Labs    12/21/22 1537 12/22/22 0509  NA 137 136  K 4.0 3.7  CL 103 105  CO2  --  22  GLUCOSE 106* 89  BUN 10 13  CREATININE 1.20 1.01  CALCIUM  --  8.6*   LFT Recent Labs    12/21/22 1522 12/22/22 0509  PROT 7.7 6.7  ALBUMIN 3.9 3.4*  AST 63* 48*  ALT 147* 117*  ALKPHOS 155* 115  BILITOT 1.3* 1.6*  BILIDIR 0.7*  --   IBILI 0.6  --  Lab Results  Component Value  Date   LIPASE 44 12/21/2022    PT/INR No results for input(s): "LABPROT", "INR" in the last 72 hours.  STUDIES: Images reviewed by me MR ABDOMEN MRCP W WO CONTAST  Result Date: 12/22/2022 CLINICAL DATA:  Right upper quadrant pain for 5 days. Cholelithiasis and biliary ductal dilatation on recent ultrasound. EXAM: MRI ABDOMEN WITHOUT AND WITH CONTRAST (INCLUDING MRCP) TECHNIQUE: Multiplanar multisequence MR imaging of the abdomen was performed both before and after the administration of intravenous contrast. Heavily T2-weighted images of the biliary and pancreatic ducts were obtained, and three-dimensional MRCP images were rendered by post processing. CONTRAST:  10mL GADAVIST GADOBUTROL 1 MMOL/ML IV SOLN COMPARISON:  None Available. FINDINGS: Lower chest: No acute findings. Hepatobiliary: No hepatic masses identified. A few large gallstones are seen measuring up to 2.5 cm. No evidence of acute cholecystitis. Diffuse biliary ductal dilatation is seen with common bile duct measuring 11 mm in diameter. A 10 mm calculus is seen in the distal common bile duct, as well as sludge or other tiny calculi. Pancreas: No mass or inflammatory changes seen. Mild diffuse pancreatic ductal dilatation is noted, without signs of pancreas divisum. Spleen:  Within normal limits in size and appearance. Adrenals/Urinary Tract: No suspicious masses identified. No evidence of hydronephrosis. Stomach/Bowel: Unremarkable. Vascular/Lymphatic: No pathologically enlarged lymph nodes identified. No acute vascular findings. Other:  None. Musculoskeletal:  No suspicious bone lesions identified. IMPRESSION: Cholelithiasis. No radiographic evidence of acute cholecystitis. Diffuse biliary ductal dilatation, with 10 mm calculus and sludge in distal common bile duct. Mild diffuse pancreatic ductal dilatation. No radiographic evidence of pancreatic mass or acute pancreatitis. Electronically Signed   By: Danae Orleans M.D.   On: 12/22/2022 09:24    MR 3D Recon At Scanner  Result Date: 12/22/2022 CLINICAL DATA:  Right upper quadrant pain for 5 days. Cholelithiasis and biliary ductal dilatation on recent ultrasound. EXAM: MRI ABDOMEN WITHOUT AND WITH CONTRAST (INCLUDING MRCP) TECHNIQUE: Multiplanar multisequence MR imaging of the abdomen was performed both before and after the administration of intravenous contrast. Heavily T2-weighted images of the biliary and pancreatic ducts were obtained, and three-dimensional MRCP images were rendered by post processing. CONTRAST:  10mL GADAVIST GADOBUTROL 1 MMOL/ML IV SOLN COMPARISON:  None Available. FINDINGS: Lower chest: No acute findings. Hepatobiliary: No hepatic masses identified. A few large gallstones are seen measuring up to 2.5 cm. No evidence of acute cholecystitis. Diffuse biliary ductal dilatation is seen with common bile duct measuring 11 mm in diameter. A 10 mm calculus is seen in the distal common bile duct, as well as sludge or other tiny calculi. Pancreas: No mass or inflammatory changes seen. Mild diffuse pancreatic ductal dilatation is noted, without signs of pancreas divisum. Spleen:  Within normal limits in size and appearance. Adrenals/Urinary Tract: No suspicious masses identified. No evidence of hydronephrosis. Stomach/Bowel: Unremarkable. Vascular/Lymphatic: No pathologically enlarged lymph nodes identified. No acute vascular findings. Other:  None. Musculoskeletal:  No suspicious bone lesions identified. IMPRESSION: Cholelithiasis. No radiographic evidence of acute cholecystitis. Diffuse biliary ductal dilatation, with 10 mm calculus and sludge in distal common bile duct. Mild diffuse pancreatic ductal dilatation. No radiographic evidence of pancreatic mass or acute pancreatitis. Electronically Signed   By: Danae Orleans M.D.   On: 12/22/2022 09:24   US Abdomen Limited RUQ (LIVER/GB)  Result Date: 12/21/2022 CLINICAL DATA:  Right upper quadrant pain EXAM: ULTRASOUND ABDOMEN LIMITED  RIGHT UPPER QUADRANT COMPARISON:  Ultrasound 05/08/2020 FINDINGS: Gallbladder: Contracted gallbladder with large 2.8 cm stone.  No gallbladder wall thickening. No pericholecystic fluid. Indeterminate sonographic Murphy's sign due to pain medications. Common bile duct: Diameter: 13 mm. Liver: No focal lesion identified. Heterogenous parenchymal echotexture. Mild contour nodularity. Portal vein is patent on color Doppler imaging with normal direction of blood flow towards the liver. Other: None. IMPRESSION: 1. Cholelithiasis without evidence of acute cholecystitis. 2. Dilated common bile duct. A stone in the distal common bile duct is not excluded. Recommend correlation with LFTs. If abnormal consider ERCP, EUS, or MRCP for further evaluation. 3. Cirrhotic liver morphology. Electronically Signed   By: Minerva Fester M.D.   On: 12/21/2022 19:18     PREVIOUS ENDOSCOPIES:            See HPI   Thanks   LOS: 1 day   @Tayelor Osborne  Sena Slate, MD, [facility] @  12/22/2022, 11:22 AM

## 2022-12-23 ENCOUNTER — Encounter (HOSPITAL_COMMUNITY)
Admission: EM | Disposition: A | Discharge status: Home / Self Care | Payer: Self-pay | Source: Home / Self Care | Attending: Internal Medicine

## 2022-12-23 ENCOUNTER — Inpatient Hospital Stay (HOSPITAL_COMMUNITY): Payer: BC Managed Care – PPO

## 2022-12-23 ENCOUNTER — Encounter (HOSPITAL_COMMUNITY): Payer: Self-pay | Admitting: Family Medicine

## 2022-12-23 DIAGNOSIS — K8070 Calculus of gallbladder and bile duct without cholecystitis without obstruction: Secondary | ICD-10-CM | POA: Diagnosis not present

## 2022-12-23 DIAGNOSIS — K838 Other specified diseases of biliary tract: Secondary | ICD-10-CM | POA: Diagnosis not present

## 2022-12-23 HISTORY — PX: BILIARY DILATION: SHX6850

## 2022-12-23 HISTORY — PX: BILIARY STENT PLACEMENT: SHX5538

## 2022-12-23 HISTORY — PX: PANCREATIC STENT PLACEMENT: SHX5539

## 2022-12-23 HISTORY — PX: SPHINCTEROTOMY: SHX5544

## 2022-12-23 HISTORY — PX: ERCP: SHX5425

## 2022-12-23 LAB — CBC
HCT: 41.3 % (ref 39.0–52.0)
Hemoglobin: 13.3 g/dL (ref 13.0–17.0)
MCH: 27.8 pg (ref 26.0–34.0)
MCHC: 32.2 g/dL (ref 30.0–36.0)
MCV: 86.2 fL (ref 80.0–100.0)
Platelets: 222 10*3/uL (ref 150–400)
RBC: 4.79 MIL/uL (ref 4.22–5.81)
RDW: 14.6 % (ref 11.5–15.5)
WBC: 8 10*3/uL (ref 4.0–10.5)
nRBC: 0 % (ref 0.0–0.2)

## 2022-12-23 LAB — COMPREHENSIVE METABOLIC PANEL
ALT: 101 U/L — ABNORMAL HIGH (ref 0–44)
AST: 38 U/L (ref 15–41)
Albumin: 3.4 g/dL — ABNORMAL LOW (ref 3.5–5.0)
Alkaline Phosphatase: 111 U/L (ref 38–126)
Anion gap: 8 (ref 5–15)
BUN: 14 mg/dL (ref 6–20)
CO2: 25 mmol/L (ref 22–32)
Calcium: 8.9 mg/dL (ref 8.9–10.3)
Chloride: 102 mmol/L (ref 98–111)
Creatinine, Ser: 1.01 mg/dL (ref 0.61–1.24)
GFR, Estimated: >60 mL/min (ref >60)
Glucose, Bld: 97 mg/dL (ref 70–99)
Potassium: 4.2 mmol/L (ref 3.5–5.1)
Sodium: 135 mmol/L (ref 135–145)
Total Bilirubin: 1.3 mg/dL — ABNORMAL HIGH (ref <1.2)
Total Protein: 6.9 g/dL (ref 6.5–8.1)

## 2022-12-23 SURGERY — ERCP, WITH INTERVENTION IF INDICATED
Anesthesia: General

## 2022-12-23 MED ORDER — MIDAZOLAM HCL 2 MG/2ML IJ SOLN
INTRAMUSCULAR | Status: AC
Start: 2022-12-23 — End: ?
  Filled 2022-12-23: qty 2

## 2022-12-23 MED ORDER — DICLOFENAC SUPPOSITORY 100 MG
RECTAL | Status: AC
  Filled 2022-12-23: qty 1

## 2022-12-23 MED ORDER — SUGAMMADEX SODIUM 200 MG/2ML IV SOLN
INTRAVENOUS | Status: DC | PRN
  Administered 2022-12-23: 350 mg via INTRAVENOUS

## 2022-12-23 MED ORDER — FENTANYL CITRATE (PF) 100 MCG/2ML IJ SOLN
INTRAMUSCULAR | Status: AC
  Filled 2022-12-23: qty 2

## 2022-12-23 MED ORDER — GLUCAGON HCL RDNA (DIAGNOSTIC) 1 MG IJ SOLR
INTRAMUSCULAR | Status: AC
Start: 2022-12-23 — End: ?
  Filled 2022-12-23: qty 1

## 2022-12-23 MED ORDER — DEXAMETHASONE SODIUM PHOSPHATE 10 MG/ML IJ SOLN
INTRAMUSCULAR | Status: DC | PRN
  Administered 2022-12-23: 5 mg via INTRAVENOUS

## 2022-12-23 MED ORDER — ROCURONIUM BROMIDE 100 MG/10ML IV SOLN
INTRAVENOUS | Status: DC | PRN
  Administered 2022-12-23: 60 mg via INTRAVENOUS

## 2022-12-23 MED ORDER — ONDANSETRON HCL 4 MG/2ML IJ SOLN
INTRAMUSCULAR | Status: DC | PRN
  Administered 2022-12-23: 4 mg via INTRAVENOUS

## 2022-12-23 MED ORDER — FENTANYL CITRATE (PF) 100 MCG/2ML IJ SOLN
INTRAMUSCULAR | Status: DC | PRN
  Administered 2022-12-23 (×3): 50 ug via INTRAVENOUS

## 2022-12-23 MED ORDER — GLUCAGON HCL RDNA (DIAGNOSTIC) 1 MG IJ SOLR
INTRAMUSCULAR | Status: DC | PRN
  Administered 2022-12-23: .5 mg via INTRAVENOUS

## 2022-12-23 MED ORDER — LIDOCAINE HCL (CARDIAC) PF 100 MG/5ML IV SOSY
PREFILLED_SYRINGE | INTRAVENOUS | Status: DC | PRN
  Administered 2022-12-23: 60 mg via INTRAVENOUS

## 2022-12-23 MED ORDER — SODIUM CHLORIDE 0.9 % IV SOLN
1.5000 g | Freq: Once | INTRAVENOUS | Status: AC
  Administered 2022-12-23: 1.5 g via INTRAVENOUS
  Filled 2022-12-23: qty 4

## 2022-12-23 MED ORDER — SODIUM CHLORIDE 0.9 % IV SOLN: INTRAVENOUS | Status: DC

## 2022-12-23 MED ORDER — SODIUM CHLORIDE 0.9 % IV SOLN
INTRAVENOUS | Status: DC | PRN
  Administered 2022-12-23: 30 mL

## 2022-12-23 MED ORDER — PROPOFOL 10 MG/ML IV BOLUS
INTRAVENOUS | Status: AC
  Filled 2022-12-23: qty 20

## 2022-12-23 MED ORDER — DICLOFENAC SUPPOSITORY 100 MG
RECTAL | Status: DC | PRN
  Administered 2022-12-23: 100 mg via RECTAL

## 2022-12-23 MED ORDER — SODIUM CHLORIDE 0.9 % IV SOLN
3.0000 g | Freq: Four times a day (QID) | INTRAVENOUS | Status: DC
  Administered 2022-12-23 – 2022-12-25 (×7): 3 g via INTRAVENOUS
  Filled 2022-12-23 (×8): qty 8

## 2022-12-23 MED ORDER — MIDAZOLAM HCL 5 MG/5ML IJ SOLN
INTRAMUSCULAR | Status: DC | PRN
  Administered 2022-12-23: 2 mg via INTRAVENOUS

## 2022-12-23 MED ORDER — PROPOFOL 10 MG/ML IV BOLUS
INTRAVENOUS | Status: DC | PRN
  Administered 2022-12-23: 200 mg via INTRAVENOUS

## 2022-12-23 NOTE — Transfer of Care (Signed)
Immediate Anesthesia Transfer of Care Note  Patient: Tonna Corner  Procedure(s) Performed: ENDOSCOPIC RETROGRADE CHOLANGIOPANCREATOGRAPHY (ERCP) SPHINCTEROTOMY BILIARY DILATION BILIARY STENT PLACEMENT PANCREATIC STENT PLACEMENT  Patient Location: Endoscopy Unit  Anesthesia Type:General  Level of Consciousness: awake, alert , and oriented  Airway & Oxygen Therapy: Patient Spontanous Breathing and Patient connected to face mask oxygen  Post-op Assessment: Report given to RN and Post -op Vital signs reviewed and stable  Post vital signs: Reviewed and stable  Last Vitals:  Vitals Value Taken Time  BP    Temp 36.4 C 12/23/22 1108  Pulse 78 12/23/22 1108  Resp 11 12/23/22 1108  SpO2 92 % 12/23/22 1108  Vitals shown include unfiled device data.  Last Pain:  Vitals:   12/23/22 1108  TempSrc: Temporal  PainSc:       Patients Stated Pain Goal: 0 (12/22/22 2115)  Complications: No notable events documented.

## 2022-12-23 NOTE — Anesthesia Procedure Notes (Signed)
Procedure Name: Intubation Date/Time: 12/23/2022 9:42 AM  Performed by: Sampson Goon, CRNAPre-anesthesia Checklist: Patient identified, Emergency Drugs available, Suction available and Patient being monitored Patient Re-evaluated:Patient Re-evaluated prior to induction Oxygen Delivery Method: Circle System Utilized Preoxygenation: Pre-oxygenation with 100% oxygen Induction Type: IV induction Ventilation: Mask ventilation without difficulty and Oral airway inserted - appropriate to patient size Laryngoscope Size: Glidescope and 4 Grade View: Grade I Tube type: Oral Tube size: 7.5 mm Number of attempts: 1 Airway Equipment and Method: Stylet and Oral airway Placement Confirmation: ETT inserted through vocal cords under direct vision, positive ETCO2 and breath sounds checked- equal and bilateral Secured at: 26 cm Tube secured with: Tape Dental Injury: Teeth and Oropharynx as per pre-operative assessment  Comments: Grade 4 view with DL x1 with MAC 4. Grade 1  view with glidescope and successful intubation.

## 2022-12-23 NOTE — Consult Note (Signed)
Reason for Consult: Abdominal pain Referring Physician: Dr Donnelly Stager Tony Walters is an 47 y.o. male.  HPI: The patient is a 47 year old black male who presents with abdominal pain that started a week ago.  He reported the pain to be in his upper abdomen.  He thought he had food poisoning.  The pain never got better so he finally came to the emergency department.  The pain was associated with some nausea.  He was found to have a large stone in the gallbladder and a 10 mm stone in his common bile duct.  He underwent ERCP today and they were unsuccessful at getting the stone out of the common bile duct.  A stent was placed.  Past Medical History:  Diagnosis Date   Chicken pox as a child   Fatty liver    Grief reaction 04/04/2013   HTN (hypertension) 04/04/2013   PTSD (post-traumatic stress disorder)    Unspecified hypothyroidism 04/07/2013    Past Surgical History:  Procedure Laterality Date   COLONOSCOPY  02/2020   FOOT SURGERY     x4- plantar fibroma, screw in first metatarsal- both feet   HYDROCELE EXCISION / REPAIR  47 years old   WISDOM TOOTH EXTRACTION  2010    Family History  Problem Relation Age of Onset   Congestive Heart Failure Father    Hyperlipidemia Father    Hypertension Father    Kidney disease Maternal Grandmother    Diabetes Maternal Grandmother        type 2   Stroke Maternal Grandmother    Alcohol abuse Paternal Grandmother    Pneumonia Paternal Grandfather    Asthma Brother    Breast cancer Neg Hx    Colon cancer Neg Hx    Esophageal cancer Neg Hx    Stomach cancer Neg Hx    Rectal cancer Neg Hx     Social History:  reports that he has been smoking cigarettes. He has a 12.5 pack-year smoking history. He has never used smokeless tobacco. He reports current alcohol use. He reports that he does not use drugs.  Allergies: No Known Allergies  Medications: I have reviewed the patient's current medications.  Results for orders placed or performed  during the hospital encounter of 12/21/22 (from the past 48 hour(s))  HIV Antibody (routine testing w rflx)     Status: None   Collection Time: 12/22/22  5:09 AM  Result Value Ref Range   HIV Screen 4th Generation wRfx Non Reactive Non Reactive    Comment: Performed at [facility], [address], [place], [place] [address]  Hepatitis panel, acute     Status: None   Collection Time: 12/22/22  5:09 AM  Result Value Ref Range   Hepatitis B Surface Ag NON REACTIVE NON REACTIVE   HCV Ab NON REACTIVE NON REACTIVE    Comment: (NOTE) Nonreactive HCV antibody screen is consistent with no HCV infections,  unless recent infection is suspected or other evidence exists to indicate HCV infection.     Hep A IgM NON REACTIVE NON REACTIVE   Hep B C IgM NON REACTIVE NON REACTIVE    Comment: Performed at [facility], [address], [place], [place] [address]  Acetaminophen level     Status: Abnormal   Collection Time: 12/22/22  5:09 AM  Result Value Ref Range   Acetaminophen (Tylenol), Serum <10 (L) 10 - 30 ug/mL    Comment: (NOTE) Therapeutic concentrations vary significantly. A range of  10-30 ug/mL  may be an effective concentration for many patients. However, some  are best treated at concentrations outside of this range. Acetaminophen concentrations >150 ug/mL at 4 hours after ingestion  and >50 ug/mL at 12 hours after ingestion are often associated with  toxic reactions.  Performed at [facility], [address], [place], [place] [address]   Comprehensive metabolic panel     Status: Abnormal   Collection Time: 12/22/22  5:09 AM  Result Value Ref Range   Sodium 136 135 - 145 mmol/L   Potassium 3.7 3.5 - 5.1 mmol/L   Chloride 105 98 - 111 mmol/L   CO2 22 22 - 32 mmol/L   Glucose, Bld 89 70 - 99 mg/dL    Comment: Glucose reference range applies only to samples taken after fasting for at least 8 hours.   BUN 13 6 - 20 mg/dL   Creatinine, Ser 0.86 0.61 -  1.24 mg/dL   Calcium 8.6 (L) 8.9 - 10.3 mg/dL   Total Protein 6.7 6.5 - 8.1 g/dL   Albumin 3.4 (L) 3.5 - 5.0 g/dL   AST 48 (H) 15 - 41 U/L   ALT 117 (H) 0 - 44 U/L   Alkaline Phosphatase 115 38 - 126 U/L   Total Bilirubin 1.6 (H) <1.2 mg/dL   GFR, Estimated >57 >84 mL/min    Comment: (NOTE) Calculated using the CKD-EPI Creatinine Equation (2021)    Anion gap 9 5 - 15    Comment: Performed at [facility], [address], [place], [place] [address]  CBC     Status: None   Collection Time: 12/22/22  5:09 AM  Result Value Ref Range   WBC 8.6 4.0 - 10.5 K/uL   RBC 4.79 4.22 - 5.81 MIL/uL   Hemoglobin 13.2 13.0 - 17.0 g/dL   HCT 52.8 41.3 - 24.4 %   MCV 86.0 80.0 - 100.0 fL   MCH 27.6 26.0 - 34.0 pg   MCHC 32.0 30.0 - 36.0 g/dL   RDW 01.0 27.2 - 53.6 %   Platelets 195 150 - 400 K/uL   nRBC 0.0 0.0 - 0.2 %    Comment: Performed at [facility], [address], [place], [place] [address]  Comprehensive metabolic panel     Status: Abnormal   Collection Time: 12/23/22  4:40 AM  Result Value Ref Range   Sodium 135 135 - 145 mmol/L   Potassium 4.2 3.5 - 5.1 mmol/L   Chloride 102 98 - 111 mmol/L   CO2 25 22 - 32 mmol/L   Glucose, Bld 97 70 - 99 mg/dL    Comment: Glucose reference range applies only to samples taken after fasting for at least 8 hours.   BUN 14 6 - 20 mg/dL   Creatinine, Ser 4.74 0.61 - 1.24 mg/dL   Calcium 8.9 8.9 - 25.9 mg/dL   Total Protein 6.9 6.5 - 8.1 g/dL   Albumin 3.4 (L) 3.5 - 5.0 g/dL   AST 38 15 - 41 U/L   ALT 101 (H) 0 - 44 U/L   Alkaline Phosphatase 111 38 - 126 U/L   Total Bilirubin 1.3 (H) <1.2 mg/dL   GFR, Estimated >56 >38 mL/min    Comment: (NOTE) Calculated using the CKD-EPI Creatinine Equation (2021)    Anion gap 8 5 - 15    Comment: Performed at [facility], [address], [place], [place] [address]  CBC  Status: None   Collection Time: 12/23/22  4:40 AM  Result Value Ref Range    WBC 8.0 4.0 - 10.5 K/uL   RBC 4.79 4.22 - 5.81 MIL/uL   Hemoglobin 13.3 13.0 - 17.0 g/dL   HCT 32.4 40.1 - 02.7 %   MCV 86.2 80.0 - 100.0 fL   MCH 27.8 26.0 - 34.0 pg   MCHC 32.2 30.0 - 36.0 g/dL   RDW 25.3 66.4 - 40.3 %   Platelets 222 150 - 400 K/uL   nRBC 0.0 0.0 - 0.2 %    Comment: Performed at [facility], [address] [place], [place] [address]    DG ERCP  Result Date: 12/23/2022 CLINICAL DATA:  Choledocholithiasis. EXAM: ERCP TECHNIQUE: Multiple spot images obtained with the fluoroscopic device and submitted for interpretation post-procedure. FLUOROSCOPY TIME: FLUOROSCOPY TIME 3 minutes, 56 seconds (74.8 mGy) COMPARISON:  MRCP-12/22/2022 FINDINGS: Ten spot fluoroscopic images of the right upper abdominal quadrant during ERCP are provided for review. Initial image demonstrates an ERCP probe overlying the right upper abdominal quadrant. Subsequent images demonstrate placement of a pancreatic stent and selective cannulation and opacification of the common bile duct. Subsequent images demonstrate insufflation of a balloon within the central aspect of the CBD with subsequent presumed biliary sweeping, stone extraction and sphincterotomy. Completion image demonstrates placement of a CBD stent with unchanged positioning of pancreatic stent. IMPRESSION: ERCP with presumed biliary sweeping, stone extraction and sphincterotomy and CBD and pancreatic stent placements as above. These images were submitted for radiologic interpretation only. Please see the procedural report for the amount of contrast and the fluoroscopy time utilized. Electronically Signed   By: Simonne Come M.D.   On: 12/23/2022 13:13   DG C-Arm 1-60 Min-No Report  Result Date: 12/23/2022 Fluoroscopy was utilized by the requesting physician.  No radiographic interpretation.   MR ABDOMEN MRCP W WO CONTAST  Result Date: 12/22/2022 CLINICAL DATA:  Right upper quadrant pain for 5 days. Cholelithiasis and  biliary ductal dilatation on recent ultrasound. EXAM: MRI ABDOMEN WITHOUT AND WITH CONTRAST (INCLUDING MRCP) TECHNIQUE: Multiplanar multisequence MR imaging of the abdomen was performed both before and after the administration of intravenous contrast. Heavily T2-weighted images of the biliary and pancreatic ducts were obtained, and three-dimensional MRCP images were rendered by post processing. CONTRAST:  10mL GADAVIST GADOBUTROL 1 MMOL/ML IV SOLN COMPARISON:  None Available. FINDINGS: Lower chest: No acute findings. Hepatobiliary: No hepatic masses identified. A few large gallstones are seen measuring up to 2.5 cm. No evidence of acute cholecystitis. Diffuse biliary ductal dilatation is seen with common bile duct measuring 11 mm in diameter. A 10 mm calculus is seen in the distal common bile duct, as well as sludge or other tiny calculi. Pancreas: No mass or inflammatory changes seen. Mild diffuse pancreatic ductal dilatation is noted, without signs of pancreas divisum. Spleen:  Within normal limits in size and appearance. Adrenals/Urinary Tract: No suspicious masses identified. No evidence of hydronephrosis. Stomach/Bowel: Unremarkable. Vascular/Lymphatic: No pathologically enlarged lymph nodes identified. No acute vascular findings. Other:  None. Musculoskeletal:  No suspicious bone lesions identified. IMPRESSION: Cholelithiasis. No radiographic evidence of acute cholecystitis. Diffuse biliary ductal dilatation, with 10 mm calculus and sludge in distal common bile duct. Mild diffuse pancreatic ductal dilatation. No radiographic evidence of pancreatic mass or acute pancreatitis. Electronically Signed   By: Danae Orleans M.D.   On: 12/22/2022 09:24   MR 3D Recon At Scanner  Result Date: 12/22/2022 CLINICAL DATA:  Right upper quadrant pain  for 5 days. Cholelithiasis and biliary ductal dilatation on recent ultrasound. EXAM: MRI ABDOMEN WITHOUT AND WITH CONTRAST (INCLUDING MRCP) TECHNIQUE: Multiplanar  multisequence MR imaging of the abdomen was performed both before and after the administration of intravenous contrast. Heavily T2-weighted images of the biliary and pancreatic ducts were obtained, and three-dimensional MRCP images were rendered by post processing. CONTRAST:  10mL GADAVIST GADOBUTROL 1 MMOL/ML IV SOLN COMPARISON:  None Available. FINDINGS: Lower chest: No acute findings. Hepatobiliary: No hepatic masses identified. A few large gallstones are seen measuring up to 2.5 cm. No evidence of acute cholecystitis. Diffuse biliary ductal dilatation is seen with common bile duct measuring 11 mm in diameter. A 10 mm calculus is seen in the distal common bile duct, as well as sludge or other tiny calculi. Pancreas: No mass or inflammatory changes seen. Mild diffuse pancreatic ductal dilatation is noted, without signs of pancreas divisum. Spleen:  Within normal limits in size and appearance. Adrenals/Urinary Tract: No suspicious masses identified. No evidence of hydronephrosis. Stomach/Bowel: Unremarkable. Vascular/Lymphatic: No pathologically enlarged lymph nodes identified. No acute vascular findings. Other:  None. Musculoskeletal:  No suspicious bone lesions identified. IMPRESSION: Cholelithiasis. No radiographic evidence of acute cholecystitis. Diffuse biliary ductal dilatation, with 10 mm calculus and sludge in distal common bile duct. Mild diffuse pancreatic ductal dilatation. No radiographic evidence of pancreatic mass or acute pancreatitis. Electronically Signed   By: Danae Orleans M.D.   On: 12/22/2022 09:24   US Abdomen Limited RUQ (LIVER/GB)  Result Date: 12/21/2022 CLINICAL DATA:  Right upper quadrant pain EXAM: ULTRASOUND ABDOMEN LIMITED RIGHT UPPER QUADRANT COMPARISON:  Ultrasound 05/08/2020 FINDINGS: Gallbladder: Contracted gallbladder with large 2.8 cm stone. No gallbladder wall thickening. No pericholecystic fluid. Indeterminate sonographic Murphy's sign due to pain medications. Common bile  duct: Diameter: 13 mm. Liver: No focal lesion identified. Heterogenous parenchymal echotexture. Mild contour nodularity. Portal vein is patent on color Doppler imaging with normal direction of blood flow towards the liver. Other: None. IMPRESSION: 1. Cholelithiasis without evidence of acute cholecystitis. 2. Dilated common bile duct. A stone in the distal common bile duct is not excluded. Recommend correlation with LFTs. If abnormal consider ERCP, EUS, or MRCP for further evaluation. 3. Cirrhotic liver morphology. Electronically Signed   By: Minerva Fester M.D.   On: 12/21/2022 19:18    Review of Systems  Constitutional: Negative.   HENT: Negative.    Eyes: Negative.   Respiratory: Negative.    Cardiovascular: Negative.   Gastrointestinal:  Positive for abdominal pain.  Endocrine: Negative.   Genitourinary: Negative.   Musculoskeletal: Negative.   Skin: Negative.   Allergic/Immunologic: Negative.   Neurological: Negative.   Hematological: Negative.   Psychiatric/Behavioral: Negative.     Blood pressure 122/80, pulse (!) 59, temperature 98.1 F (36.7 C), temperature source Oral, resp. rate 17, height 6\' 2"  (1.88 m), weight 120.2 kg, SpO2 96%. Physical Exam Vitals reviewed.  Constitutional:      General: He is not in acute distress.    Appearance: Normal appearance.  HENT:     Head: Normocephalic and atraumatic.     Right Ear: External ear normal.     Left Ear: External ear normal.     Nose: Nose normal.     Mouth/Throat:     Mouth: Mucous membranes are moist.     Pharynx: Oropharynx is clear.  Eyes:     General: No scleral icterus.    Extraocular Movements: Extraocular movements intact.     Conjunctiva/sclera: Conjunctivae normal.  Pupils: Pupils are equal, round, and reactive to light.  Cardiovascular:     Rate and Rhythm: Normal rate and regular rhythm.     Pulses: Normal pulses.     Heart sounds: Normal heart sounds.  Pulmonary:     Effort: Pulmonary effort is normal.  No respiratory distress.     Breath sounds: Normal breath sounds.  Abdominal:     General: Abdomen is flat.     Tenderness: There is abdominal tenderness.     Comments: There is mild to moderate upper abdominal tenderness more so on the right  Musculoskeletal:        General: No swelling or deformity. Normal range of motion.     Cervical back: Normal range of motion and neck supple.  Skin:    General: Skin is warm and dry.     Coloration: Skin is not jaundiced.  Neurological:     General: No focal deficit present.     Mental Status: He is alert and oriented to person, place, and time.  Psychiatric:        Mood and Affect: Mood normal.        Behavior: Behavior normal.     Assessment/Plan: The patient appears to have a large stone in the gallbladder with a common bile duct stone that was not able to be removed by ERCP today.  I do think that in the long run he will benefit from having his gallbladder removed but the question of timing of the surgery is uncertain right now.  We will consult with the GI docs to see what their longer-term plan is and then we can base our decision making as far as timing of surgery around that.  We will continue to follow him closely with you.  I would agree with covering him with broad-spectrum antibiotic therapy and bowel rest for now.  Chevis Pretty III 12/23/2022, 5:49 PM

## 2022-12-23 NOTE — Op Note (Signed)
[facility] Patient Name: Tony Walters Procedure Date: 12/23/2022 MRN: [id number] Attending MD: Iva Boop , MD, [id number] Date of Birth: 1975/12/29 CSN: [id number] Age: 47 Admit Type: Inpatient Procedure:                ERCP Indications:              Bile duct stone(s) Providers:                Iva Boop, MD, Fransisca Connors, Marja Kays, Technician Referring MD:              Medicines:                General Anesthesia, Unasyn 1.5 g IV, diclofenac 100                            mg per rectum pre-op Complications:            No immediate complications. Estimated Blood Loss:     Estimated blood loss was minimal. Procedure:                Pre-Anesthesia Assessment:                           - Prior to the procedure, a History and Physical                            was performed, and patient medications and                            allergies were reviewed. The patient's tolerance of                            previous anesthesia was also reviewed. The risks                            and benefits of the procedure and the sedation                            options and risks were discussed with the patient.                            All questions were answered, and informed consent                            was obtained. Prior Anticoagulants: The patient has                            taken no anticoagulant or antiplatelet agents. ASA                            Grade Assessment: II - A patient with mild systemic  disease. After reviewing the risks and benefits,                            the patient was deemed in satisfactory condition to                            undergo the procedure.                           After obtaining informed consent, the scope was                            passed under direct vision. Throughout the                            procedure, the patient's blood pressure,  pulse, and                            oxygen saturations were monitored continuously. The                            [employer] D single use                            duodenoscope was introduced through the mouth, and                            used to inject contrast into and used to inject                            contrast into the bile duct. The ERCP was somewhat                            difficult due to challenging cannulation. The                            patient tolerated the procedure well. Scope In: Scope Out: Findings:      The scout film was normal. The esophagus was successfully intubated       under direct vision. The scope was advanced to a normal major papilla in       the descending duodenum without detailed examination of the pharynx,       larynx and associated structures, and upper GI tract. The upper GI tract       was grossly normal. The papillary area was edematous with some redundnat       tissue + small nearby divertculum above. Orifice identified - shot-nose       sphincterotome used and wire would not enter bile duct but entered       pancreatic duct so left in place and 2-wire approach used to cannulate       bile duct. pancreatic wire removed. Contrast into bile duct with distal       stone seen - about 9-10 mm size. marked extrahepatic duct dilation w/       max 12 mm and some slight intrahepatic dilation.  Gallbladder did not       fill. Biliary sphincterotomy performed over wire - somewhat difficult       with edema. Could not open up as much. as would prefer. Attempted       balloon sweep but stone would not pass. 09-01-08 mm balloon       sphincteroplasty performed. Repeat attempt at balloon extraction, 9-12       mm balloon but could not extract the stone. Distal bile duct was tapered       some also. Elected to stop attempting stone removal and placed a 5 cm 10       Fr plastic stent w/ good drainage. Then I placed a 4 cm 3 Fr  single       pigtail pancreatic duct plastic stent to reduce risk of post-ERCP       pancreatitis. Impression:               - Choledocholithiasis was found. Removal by biliary                            sphincterotomy was not accomplished; a stent was                            inserted.                           - The entire biliary tree was dilated, with a stone                            causing an obstruction.                           - Small duodenal diverticulum above papillary area Moderate Sedation:      Not Applicable - Patient had care per Anesthesia. Recommendation:           - Observe, f/u labs                           Clear liquids only today                           GSU consult re: cholelithiasis                           Will f/u tomorrow and outline GI f/u plans - will                            need KUB in 10 days to see if pancreatic stent is                            gone and an outpatient ERCP with biliary stent and                            stone removal Procedure Code(s):        --- Professional ---  16109, Endoscopic retrograde                            cholangiopancreatography (ERCP); with placement of                            endoscopic stent into biliary or pancreatic duct,                            including pre- and post-dilation and guide wire                            passage, when performed, including sphincterotomy,                            when performed, each stent                           43277, 59, Endoscopic retrograde                            cholangiopancreatography (ERCP); with                            trans-endoscopic balloon dilation of                            biliary/pancreatic duct(s) or of ampulla                            (sphincteroplasty), including sphincterotomy, when                            performed, each duct                           43262, 59, Endoscopic retrograde                             cholangiopancreatography (ERCP); with                            sphincterotomy/papillotomy                           60454, Endoscopic catheterization of the biliary                            ductal system, radiological supervision and                            interpretation Diagnosis Code(s):        --- Professional ---                           K80.51, Calculus of bile duct without cholangitis  or cholecystitis with obstruction CPT copyright 2022 American [employer] Association. All rights reserved. The codes documented in this report are preliminary and upon coder review may  be revised to meet current compliance requirements. Iva Boop, MD 12/23/2022 11:31:54 AM This report has been signed electronically. Number of Addenda: 0

## 2022-12-23 NOTE — Interval H&P Note (Signed)
History and Physical Interval Note:  12/23/2022 8:56 AM  Tony Walters  has presented today for surgery, with the diagnosis of choledocholithisais.  The various methods of treatment have been discussed with the patient and family. After consideration of risks, benefits and other options for treatment, the patient has consented to  Procedure(s): ENDOSCOPIC RETROGRADE CHOLANGIOPANCREATOGRAPHY (ERCP) (N/A) as a surgical intervention.  The patient's history has been reviewed, patient examined, no change in status, stable for surgery.  I have reviewed the patient's chart and labs.  Questions were answered to the patient's satisfaction.    Has had recurrence of diarrhea x 2  that was present and had stopped PTA.  Stan Head

## 2022-12-23 NOTE — Hospital Course (Signed)
Brief hospital course: Tony Walters is a 47 y.o. male with medical history significant for hypertension, hypothyroidism, hyperlipidemia, prior heavy alcohol use, and severe hepatic steatosis who presents with epigastric pain.  Found to have choledocholithiasis with biliary colic.  Currently GI consulted.   Underwent ERCP.  Stent placed as stone was unable to be removed due to edema.  General surgery consulted.  Recommended to initiate IV antibiotics.  Assessment and Plan: Biliary colic with choledocholithiasis. Presented with epigastric pain. AST ALT elevated. CBD dilated. Lipase level normal. Bilirubin mildly elevated but improving. [facility] consulted. 11/29 underwent ERCP.  9 to 10 mm stone size seen with intrahepatic dilation without gallbladder fill.  But due to sphincter edema unable to remove the stone. A plastic stent was placed. Also evidence of distal CBD narrowing. General surgery consulted. Lap chole recommended although currently timing will depend on GIs plan. GI has informed me that they are planning outpatient ERCP. General surgery recommended to initiate IV antibiotic.  Unasyn started.   Hypothyroidism. Continue Synthroid.   Depression. Continue Cymbalta.   Blood pressure. Currently stable.   Prior history of steatosis. Reported history of alcohol abuse but currently does not drink heavily. For now monitoring. Has seen hepatology outpatient.   Obesity Class 1 Body mass index is 34.02 kg/m.  Placing the pt at higher risk of poor outcomes.

## 2022-12-23 NOTE — Anesthesia Postprocedure Evaluation (Signed)
Anesthesia Post Note  Patient: Tony Walters  Procedure(s) Performed: ENDOSCOPIC RETROGRADE CHOLANGIOPANCREATOGRAPHY (ERCP) SPHINCTEROTOMY BILIARY DILATION BILIARY STENT PLACEMENT PANCREATIC STENT PLACEMENT     Patient location during evaluation: Endoscopy Anesthesia Type: General Level of consciousness: awake and alert Pain management: pain level controlled Vital Signs Assessment: post-procedure vital signs reviewed and stable Respiratory status: spontaneous breathing, nonlabored ventilation, respiratory function stable and patient connected to nasal cannula oxygen Cardiovascular status: blood pressure returned to baseline and stable Postop Assessment: no apparent nausea or vomiting Anesthetic complications: no  No notable events documented.  Last Vitals:  Vitals:   12/23/22 1140 12/23/22 1145  BP: 128/62 126/63  Pulse: 70 71  Resp: 12 13  Temp:    SpO2: 94% 93%    Last Pain:  Vitals:   12/23/22 1200  TempSrc:   PainSc: 0-No pain                 Arland Usery,W. EDMOND

## 2022-12-23 NOTE — Anesthesia Preprocedure Evaluation (Addendum)
Anesthesia Evaluation  Patient identified by MRN, date of birth, ID band Patient awake    Reviewed: Allergy & Precautions, H&P , NPO status , Patient's Chart, lab work & pertinent test results  Airway Mallampati: III  TM Distance: >3 FB Neck ROM: Full    Dental no notable dental hx. (+) Teeth Intact, Dental Advisory Given   Pulmonary Current Smoker and Patient abstained from smoking.   Pulmonary exam normal breath sounds clear to auscultation       Cardiovascular hypertension, Pt. on medications  Rhythm:Regular Rate:Normal     Neuro/Psych   Anxiety Depression    negative neurological ROS     GI/Hepatic negative GI ROS, Neg liver ROS,,,  Endo/Other  Hypothyroidism    Renal/GU negative Renal ROS  negative genitourinary   Musculoskeletal   Abdominal   Peds  Hematology negative hematology ROS (+)   Anesthesia Other Findings   Reproductive/Obstetrics negative OB ROS                             Anesthesia Physical Anesthesia Plan  ASA: 2  Anesthesia Plan: General   Post-op Pain Management: Minimal or no pain anticipated   Induction: Intravenous  PONV Risk Score and Plan: 1 and Treatment may vary due to age or medical condition  Airway Management Planned: Oral ETT  Additional Equipment:   Intra-op Plan:   Post-operative Plan: Extubation in OR  Informed Consent: I have reviewed the patients History and Physical, chart, labs and discussed the procedure including the risks, benefits and alternatives for the proposed anesthesia with the patient or authorized representative who has indicated his/her understanding and acceptance.     Dental advisory given  Plan Discussed with: CRNA  Anesthesia Plan Comments:        Anesthesia Quick Evaluation

## 2022-12-23 NOTE — Progress Notes (Signed)
[facility] Progress Note Patient: Tony Walters WNU:[id number] DOB: September 09, 1975 DOA: 12/21/2022  DOS: the patient was seen and examined on 12/23/2022  Brief hospital course: Tony Walters is a 47 y.o. male with medical history significant for hypertension, hypothyroidism, hyperlipidemia, prior heavy alcohol use, and severe hepatic steatosis who presents with epigastric pain.  Found to have choledocholithiasis with biliary colic.  Currently GI consulted.   Underwent ERCP.  Stent placed as stone was unable to be removed due to edema.  General surgery consulted.  Recommended to initiate IV antibiotics.  Assessment and Plan: Biliary colic with choledocholithiasis. Presented with epigastric pain. AST ALT elevated. CBD dilated. Lipase level normal. Bilirubin mildly elevated but improving. [name] consulted. 11/29 underwent ERCP.  9 to 10 mm stone size seen with intrahepatic dilation without gallbladder fill.  But due to sphincter edema unable to remove the stone. A plastic stent was placed. Also evidence of distal CBD narrowing. General surgery consulted. Lap chole recommended although currently timing will depend on GIs plan. GI has informed me that they are planning outpatient ERCP. General surgery recommended to initiate IV antibiotic.  Unasyn started.   Hypothyroidism. Continue Synthroid.   Depression. Continue Cymbalta.   Blood pressure. Currently stable.   Prior history of steatosis. Reported history of alcohol abuse but currently does not drink heavily. For now monitoring. Has seen hepatology outpatient.   Obesity Class 1 Body mass index is 34.02 kg/m.  Placing the pt at higher risk of poor outcomes.   Subjective: Abdominal pain still present but improving.  No nausea no vomiting.  No fever no chills.  Passing gas but no BM.  Physical Exam: General: in Mild distress, No Rash Cardiovascular: S1 and S2 Present, No Murmur Respiratory: Good respiratory  effort, Bilateral Air entry present. No Crackles, No wheezes Abdomen: Bowel Sound present, epigastric tenderness Extremities: No edema Neuro: Alert and oriented x3, no new focal deficit  Data Reviewed: I have Reviewed nursing notes, Vitals, and Lab results. Since last encounter, pertinent lab results CBC and BMP   . I have ordered test including CBC and CMP  . I have discussed pt's care plan and test results with GI and general surgery  .   Disposition: Status is: Inpatient Remains inpatient appropriate because: Monitor for improvement abdominal pain and further plans  SCDs Start: 12/22/22 0136   Family Communication: No one at bedside Level of care: Med-Surg   Vitals:   12/23/22 1130 12/23/22 1140 12/23/22 1145 12/23/22 1330  BP: (!) 151/84 128/62 126/63 122/80  Pulse: 72 70 71 (!) 59  Resp: 14 12 13 17   Temp:    98.1 F (36.7 C)  TempSrc:    Oral  SpO2: 92% 94% 93% 96%  Weight:      Height:         Author: Lynden Oxford, MD 12/23/2022 6:33 PM  Please look on [email or web address] to find out who is on call.

## 2022-12-23 NOTE — Progress Notes (Signed)
Pharmacy Antibiotic Note  Tony Walters is a 47 y.o. male admitted on 12/21/2022 with intra-abdominal infection.  Pharmacy has been consulted for Unasyn dosing.  Plan: Unasyn 3g IV q6h Pharmacy will sign off notes, but will follow up peripherally for renal function, culture results, and clinical course.   Height: 6\' 2"  (188 cm) Weight: 120.2 kg (264 lb 15.9 oz) IBW/kg (Calculated) : 82.2  Temp (24hrs), Avg:97.8 F (36.6 C), Min:97.5 F (36.4 C), Max:98.1 F (36.7 C)  Recent Labs  Lab 12/21/22 1522 12/21/22 1537 12/22/22 0509 12/23/22 0440  WBC 11.5*  --  8.6 8.0  CREATININE  --  1.20 1.01 1.01    Estimated Creatinine Clearance: 121.9 mL/min (by C-G formula based on SCr of 1.01 mg/dL).    No Known Allergies  Antimicrobials this admission: 11/29 Unasyn >>   Dose adjustments this admission:   Microbiology results: none  Thank you for allowing pharmacy to be a part of this patient's care.  Lynann Beaver PharmD, BCPS [facility] main pharmacy [phone] 12/23/2022 6:42 PM

## 2022-12-24 DIAGNOSIS — Z4889 Encounter for other specified surgical aftercare: Secondary | ICD-10-CM

## 2022-12-24 DIAGNOSIS — K8070 Calculus of gallbladder and bile duct without cholecystitis without obstruction: Secondary | ICD-10-CM

## 2022-12-24 LAB — CBC
HCT: 40 % (ref 39.0–52.0)
Hemoglobin: 13.1 g/dL (ref 13.0–17.0)
MCH: 28.2 pg (ref 26.0–34.0)
MCHC: 32.8 g/dL (ref 30.0–36.0)
MCV: 86 fL (ref 80.0–100.0)
Platelets: 241 10*3/uL (ref 150–400)
RBC: 4.65 MIL/uL (ref 4.22–5.81)
RDW: 14.3 % (ref 11.5–15.5)
WBC: 8.5 10*3/uL (ref 4.0–10.5)
nRBC: 0 % (ref 0.0–0.2)

## 2022-12-24 LAB — COMPREHENSIVE METABOLIC PANEL
ALT: 92 U/L — ABNORMAL HIGH (ref 0–44)
AST: 45 U/L — ABNORMAL HIGH (ref 15–41)
Albumin: 3.3 g/dL — ABNORMAL LOW (ref 3.5–5.0)
Alkaline Phosphatase: 120 U/L (ref 38–126)
Anion gap: 9 (ref 5–15)
BUN: 14 mg/dL (ref 6–20)
CO2: 26 mmol/L (ref 22–32)
Calcium: 9 mg/dL (ref 8.9–10.3)
Chloride: 102 mmol/L (ref 98–111)
Creatinine, Ser: 1.06 mg/dL (ref 0.61–1.24)
GFR, Estimated: >60 mL/min (ref >60)
Glucose, Bld: 90 mg/dL (ref 70–99)
Potassium: 3.8 mmol/L (ref 3.5–5.1)
Sodium: 137 mmol/L (ref 135–145)
Total Bilirubin: 0.8 mg/dL (ref <1.2)
Total Protein: 6.8 g/dL (ref 6.5–8.1)

## 2022-12-24 LAB — LIPASE, BLOOD: Lipase: 31 U/L (ref 11–51)

## 2022-12-24 NOTE — TOC Initial Note (Signed)
Transition of Care [facility]) - Initial/Assessment Note    Patient Details  Name: Tony Walters MRN: [id number] Date of Birth: [birth year]  [facility] CM/SW Contact:    Tony Prows, RN Phone Number: 12/24/2022, 4:57 PM  Clinical Narrative:                 Tony Walters w/ pt in room; pt says he lives at home w/ spouse Tony Walters; he plans to return at d/c' pt verified  he has insurance/PCP; he has transportation; pt denies SDOH risks; he does not have DME, HH services, or home oxygen; no TOC needs; TOC signing off; please place consult if needed.  Expected Discharge Plan: Home/Self Care Barriers to Discharge: Continued Medical Work up   Patient Goals and CMS Choice Patient states their goals for this hospitalization and ongoing recovery are:: home CMS Medicare.gov Compare Post Acute Care list provided to:: Patient        Expected Discharge Plan and Services   Discharge Planning Services: CM Consult   Living arrangements for the past 2 months: Single Family Home                                      Prior Living Arrangements/Services Living arrangements for the past 2 months: Single Family Home Lives with:: Spouse Patient language and need for interpreter reviewed:: Yes Do you feel safe going back to the place where you live?: Yes      Need for Family Participation in Patient Care: Yes (Comment) Care giver support system in place?: Yes (comment) Current home services:  (n/a) Criminal Activity/Legal Involvement Pertinent to Current Situation/Hospitalization: No - Comment as needed  Activities of Daily Living   ADL Screening (condition at time of admission) Independently performs ADLs?: Yes (appropriate for developmental age) Does the patient have a NEW difficulty with bathing/dressing/toileting/self-feeding that is expected to last >3 days?: No Does the patient have a NEW difficulty with getting in/out of bed, walking, or climbing  stairs that is expected to last >3 days?: No Does the patient have a NEW difficulty with communication that is expected to last >3 days?: No Is the patient deaf or have difficulty hearing?: No Does the patient have difficulty seeing, even when wearing glasses/contacts?: No Does the patient have difficulty concentrating, remembering, or making decisions?: No  Permission Sought/Granted Permission sought to share information with : Tony Manager Permission granted to share information with : Yes, Verbal Permission Granted  Share Information with NAME: Tony Walters     Permission granted to share info w Relationship: Tony Walters (spouse) [phone]     Emotional Assessment Appearance:: Appears stated age Attitude/Demeanor/Rapport: Gracious Affect (typically observed): Accepting Orientation: : Oriented to Self, Oriented to Place, Oriented to  Time, Oriented to Situation Alcohol / Substance Use: Not Applicable Psych Involvement: No (comment)  Admission diagnosis:  Transaminitis [R74.01] Upper abdominal pain [R10.10] Calculus of gallbladder with biliary obstruction but without cholecystitis [K80.21] Patient Active Problem List   Diagnosis Date Noted   Acquired dilation of common bile duct 12/21/2022   Depression, recurrent (HCC) 05/31/2022   GAD (generalized anxiety disorder) 05/31/2022   Mixed hyperlipidemia 05/25/2021   Boxer's metacarpal fracture, neck, closed 07/02/2019   Premature ejaculation 03/21/2018   Tobacco abuse 05/03/2017   Depression with anxiety 05/09/2013   Obesity, unspecified 04/07/2013   Insomnia 04/07/2013   Hypothyroidism 04/07/2013   Essential hypertension 04/04/2013  Grief reaction 04/04/2013   PCP:  Sharlene Dory, DO Pharmacy:   [facility] [place] [facility] [address] [place] [address] Phone: [phone] Fax: [phone]  [id number] [place], [place] - [address] [place] [address] Phone: [phone] Fax: [phone]     Social Determinants of Health (SDOH) Social History: SDOH Screenings   Food Insecurity: No Food Insecurity (12/24/2022)  Housing: Patient Declined (12/24/2022)  Transportation Needs: No Transportation Needs (12/24/2022)  Utilities: Not At Risk (12/24/2022)  Depression (PHQ2-9): Low Risk  (12/16/2022)  Tobacco Use: High Risk (12/23/2022)   SDOH Interventions: Food Insecurity Interventions: Intervention Not Indicated, Inpatient TOC Housing Interventions: Intervention Not Indicated, Inpatient TOC Transportation Interventions: Intervention Not Indicated, Inpatient TOC Utilities Interventions: Intervention Not Indicated, Inpatient TOC   Readmission Risk Interventions     No data to display

## 2022-12-24 NOTE — Progress Notes (Signed)
1 Day Post-Op   Subjective/Chief Complaint: Feels better today   Objective: Vital signs in last 24 hours: Temp:  [97.5 F (36.4 C)-98.2 F (36.8 C)] 97.7 F (36.5 C) (11/30 0548) Pulse Rate:  [55-79] 58 (11/30 0548) Resp:  [10-19] 18 (11/30 0548) BP: (114-162)/(62-90) 117/81 (11/30 0548) SpO2:  [92 %-98 %] 95 % (11/30 0548) Last BM Date : 12/23/22 (loose per patient)  Intake/Output from previous day: 11/29 0701 - 11/30 0700 In: 900 [P.O.:600; IV Piggyback:300] Out: -  Intake/Output this shift: No intake/output data recorded.  General appearance: alert and cooperative Resp: clear to auscultation bilaterally Cardio: regular rate and rhythm GI: soft, minimal tenderness  Lab Results:  Recent Labs    12/23/22 0440 12/24/22 0452  WBC 8.0 8.5  HGB 13.3 13.1  HCT 41.3 40.0  PLT 222 241   BMET Recent Labs    12/23/22 0440 12/24/22 0452  NA 135 137  K 4.2 3.8  CL 102 102  CO2 25 26  GLUCOSE 97 90  BUN 14 14  CREATININE 1.01 1.06  CALCIUM 8.9 9.0   PT/INR No results for input(s): "LABPROT", "INR" in the last 72 hours. ABG No results for input(s): "PHART", "HCO3" in the last 72 hours.  Invalid input(s): "PCO2", "PO2"  Studies/Results: DG ERCP  Result Date: 12/23/2022 CLINICAL DATA:  Choledocholithiasis. EXAM: ERCP TECHNIQUE: Multiple spot images obtained with the fluoroscopic device and submitted for interpretation post-procedure. FLUOROSCOPY TIME: FLUOROSCOPY TIME 3 minutes, 56 seconds (74.8 mGy) COMPARISON:  MRCP-12/22/2022 FINDINGS: Ten spot fluoroscopic images of the right upper abdominal quadrant during ERCP are provided for review. Initial image demonstrates an ERCP probe overlying the right upper abdominal quadrant. Subsequent images demonstrate placement of a pancreatic stent and selective cannulation and opacification of the common bile duct. Subsequent images demonstrate insufflation of a balloon within the central aspect of the CBD with subsequent  presumed biliary sweeping, stone extraction and sphincterotomy. Completion image demonstrates placement of a CBD stent with unchanged positioning of pancreatic stent. IMPRESSION: ERCP with presumed biliary sweeping, stone extraction and sphincterotomy and CBD and pancreatic stent placements as above. These images were submitted for radiologic interpretation only. Please see the procedural report for the amount of contrast and the fluoroscopy time utilized. Electronically Signed   By: Simonne Come M.D.   On: 12/23/2022 13:13   DG C-Arm 1-60 Min-No Report  Result Date: 12/23/2022 Fluoroscopy was utilized by the requesting physician.  No radiographic interpretation.    Anti-infectives: Anti-infectives (From admission, onward)    Start     Dose/Rate Route Frequency Ordered Stop   12/23/22 1900  Ampicillin-Sulbactam (UNASYN) 3 g in sodium chloride 0.9 % 100 mL IVPB        3 g 200 mL/hr over 30 Minutes Intravenous Every 6 hours 12/23/22 1845     12/23/22 0900  ampicillin-sulbactam (UNASYN) 1.5 g in sodium chloride 0.9 % 100 mL IVPB        1.5 g 200 mL/hr over 30 Minutes Intravenous  Once 12/23/22 0851 12/23/22 0956       Assessment/Plan: s/p Procedure(s): ENDOSCOPIC RETROGRADE CHOLANGIOPANCREATOGRAPHY (ERCP) (N/A) SPHINCTEROTOMY BILIARY DILATION BILIARY STENT PLACEMENT (N/A) PANCREATIC STENT PLACEMENT CBD stone not able to be removed by ERCP. Stent placed Will benefit from lap chole at some point but will hold for now until we define a plan for how to clear cbd duct Continue IV abx  LOS: 3 days    Chevis Pretty III 12/24/2022

## 2022-12-24 NOTE — Progress Notes (Signed)
   Patient Name: Tony Walters Date of Encounter: 12/24/2022, 9:56 AM     Assessment and Plan  Choledocholithiasis-status post ERCP November 29 with sphincterotomy and sphincteroplasty (10 mm), unable to remove stone so plastic biliary stent placed.  He is improved today but is having some sensation of "something moving in the lower chest or epigastric area" Prophylactic pancreatic duct stent has been placed also  Cholelithiasis-surgery suggest elective cholecystectomy once bile duct is cleared.   --------------------------------------------------------------------------------------------------  I think both follow-up ERCP and cholecystectomy are elective and could be done later on in the outpatient setting.  I left a bile duct stent because of the difficulty with edema in the periampullary area and distal bile duct.  That should provide adequate drainage and may actually reduce the size of the stent and make it easier to remove later or we could try lithotripsy if needed.  I have ordered solid food and if he tolerates that I think he could go home and I will make sure to coordinate outpatient GI follow-up.  Will need to have a KUB in about 10 days to make sure pancreatic stent has fallen out and arrange an outpatient ERCP also.  Discussed with Dr. Allena Katz.  I am not sure what the symptoms he is describing are, though sometimes people can have a sensation from the biliary stent.  If that is more of a problem today we will keep him and reassess tomorrow.  Iva Boop, MD, [facility] See AMION on call - gastroenterology for best contact person 12/24/2022 10:46 AM    Subjective  Says he can feel something moving in the epigastric area or lower chest at times.  No significant pain.  Not like the pain he had prior to admission.  He has tolerated clear liquids otherwise.  Surgery has seen the patient and thinks cholecystectomy indicated but it is elective.      Objective  BP 117/81 (BP Location: Right Arm)   Pulse (!) 58   Temp 97.7 F (36.5 C) (Oral)   Resp 18   Ht 6\' 2"  (1.88 m)   Wt 120.2 kg   SpO2 95%   BMI 34.02 kg/m  Abd is soft and minimally tender in epigastrium   Recent Labs  Lab 12/21/22 1522 12/22/22 0509 12/23/22 0440 12/24/22 0452  AST 63* 48* 38 45*  ALT 147* 117* 101* 92*  ALKPHOS 155* 115 111 120  BILITOT 1.3* 1.6* 1.3* 0.8  PROT 7.7 6.7 6.9 6.8  ALBUMIN 3.9 3.4* 3.4* 3.3*    Recent Labs  Lab 12/21/22 1537 12/22/22 0509 12/23/22 0440 12/24/22 0452  NA 137 136 135 137  K 4.0 3.7 4.2 3.8  CL 103 105 102 102  CO2  --  22 25 26   GLUCOSE 106* 89 97 90  BUN 10 13 14 14   CREATININE 1.20 1.01 1.01 1.06  CALCIUM  --  8.6* 8.9 9.0   Lab Results  Component Value Date   LIPASE 31 12/24/2022   Recent Labs  Lab 12/22/22 0509 12/23/22 0440 12/24/22 0452  HGB 13.2 13.3 13.1  HCT 41.2 41.3 40.0  WBC 8.6 8.0 8.5  PLT 195 222 241     Iva Boop, MD, [facility] See AMION on call - gastroenterology for best contact person 12/24/2022 9:56 AM

## 2022-12-24 NOTE — Progress Notes (Signed)
[facility] Progress Note Patient: Tony Walters ZOX:[id number] DOB: [birth year] DOA: 12/21/2022  DOS: the patient was seen and examined on 12/24/2022  Brief hospital course: Tony Walters is a 47 y.o. male with medical history significant for hypertension, hypothyroidism, hyperlipidemia, prior heavy alcohol use, and severe hepatic steatosis who presents with epigastric pain.  Found to have choledocholithiasis with biliary colic.  Currently GI consulted.   Underwent ERCP.  Stent placed as stone was unable to be removed due to edema.  General surgery consulted.  Recommended to initiate IV antibiotics.  Assessment and Plan: Biliary colic with choledocholithiasis. Presented with epigastric pain. AST ALT elevated. CBD dilated. Lipase level normal. Bilirubin mildly elevated but improving. [facility] consulted. 11/29 underwent ERCP.  9 to 10 mm stone size seen with intrahepatic dilation without gallbladder fill.  But due to sphincter edema unable to remove the stone. A plastic stent was placed. Also evidence of distal CBD narrowing. General surgery consulted. Lap chole recommended although currently timing will depend on GIs plan. GI has informed me that they are planning outpatient ERCP. per GI they are okay with patient undergoing lap chole as well. Will keep patient n.p.o. after midnight in case he is a candidate for lap chole tomorrow. General surgery recommended to initiate IV antibiotic.  Unasyn started.   Hypothyroidism. Continue Synthroid.   Depression. Continue Cymbalta.   Blood pressure. Currently stable.   Prior history of steatosis. Reported history of alcohol abuse but currently does not drink heavily. For now monitoring. Has seen hepatology outpatient.   Obesity Class 1 Body mass index is 34.02 kg/m.  Placing the pt at higher risk of poor outcomes.   Subjective: Abdominal pain still present.  No nausea no vomiting.  Passing gas but no  BM.  Physical Exam: General: in Mild distress, No Rash Cardiovascular: S1 and S2 Present, No Murmur Respiratory: Good respiratory effort, Bilateral Air entry present. No Crackles, No wheezes Abdomen: Bowel Sound present, epigastric tenderness Extremities: No edema Neuro: Alert and oriented x3, no new focal deficit  Data Reviewed: I have Reviewed nursing notes, Vitals, and Lab results. Since last encounter, pertinent lab results CBC and BMP   . I have ordered test including CBC and BMP  . I have discussed pt's care plan and test results with  GI  .   Disposition: Status is: Inpatient Remains inpatient appropriate because: Monitor for improvement in abdominal pain.  SCDs Start: 12/22/22 0136   Family Communication: No one at bedside Level of care: Med-Surg   Vitals:   12/23/22 1950 12/24/22 0023 12/24/22 0548 12/24/22 1336  BP: 126/72 128/82 117/81 (!) 143/89  Pulse: 60 (!) 55 (!) 58 61  Resp: 16 19 18 20   Temp: 98.2 F (36.8 C) 97.9 F (36.6 C) 97.7 F (36.5 C) 98.4 F (36.9 C)  TempSrc: Oral Oral Oral Oral  SpO2: 98% 98% 95% 95%  Weight:      Height:         Author: Lynden Oxford, MD 12/24/2022 6:37 PM  Please look on [email or web address] to find out who is on call.

## 2022-12-25 ENCOUNTER — Encounter (HOSPITAL_COMMUNITY): Payer: Self-pay | Admitting: Internal Medicine

## 2022-12-25 ENCOUNTER — Other Ambulatory Visit: Payer: Self-pay | Admitting: Internal Medicine

## 2022-12-25 ENCOUNTER — Other Ambulatory Visit (HOSPITAL_BASED_OUTPATIENT_CLINIC_OR_DEPARTMENT_OTHER): Payer: Self-pay

## 2022-12-25 DIAGNOSIS — K838 Other specified diseases of biliary tract: Secondary | ICD-10-CM | POA: Diagnosis not present

## 2022-12-25 DIAGNOSIS — Z9889 Other specified postprocedural states: Secondary | ICD-10-CM

## 2022-12-25 DIAGNOSIS — Z4889 Encounter for other specified surgical aftercare: Secondary | ICD-10-CM | POA: Diagnosis not present

## 2022-12-25 LAB — COMPREHENSIVE METABOLIC PANEL
ALT: 90 U/L — ABNORMAL HIGH (ref 0–44)
AST: 44 U/L — ABNORMAL HIGH (ref 15–41)
Albumin: 3.4 g/dL — ABNORMAL LOW (ref 3.5–5.0)
Alkaline Phosphatase: 129 U/L — ABNORMAL HIGH (ref 38–126)
Anion gap: 6 (ref 5–15)
BUN: 15 mg/dL (ref 6–20)
CO2: 28 mmol/L (ref 22–32)
Calcium: 9.1 mg/dL (ref 8.9–10.3)
Chloride: 103 mmol/L (ref 98–111)
Creatinine, Ser: 1.07 mg/dL (ref 0.61–1.24)
GFR, Estimated: >60 mL/min (ref >60)
Glucose, Bld: 99 mg/dL (ref 70–99)
Potassium: 4.3 mmol/L (ref 3.5–5.1)
Sodium: 137 mmol/L (ref 135–145)
Total Bilirubin: 0.7 mg/dL (ref <1.2)
Total Protein: 7.1 g/dL (ref 6.5–8.1)

## 2022-12-25 LAB — CBC
HCT: 42 % (ref 39.0–52.0)
HCT: 43.6 % (ref 39.0–52.0)
Hemoglobin: 13.6 g/dL (ref 13.0–17.0)
Hemoglobin: 14.1 g/dL (ref 13.0–17.0)
MCH: 27.7 pg (ref 26.0–34.0)
MCH: 27.4 pg (ref 26.0–34.0)
MCHC: 32.4 g/dL (ref 30.0–36.0)
MCHC: 32.3 g/dL (ref 30.0–36.0)
MCV: 85.5 fL (ref 80.0–100.0)
MCV: 84.8 fL (ref 80.0–100.0)
Platelets: 288 10*3/uL (ref 150–400)
Platelets: 306 10*3/uL (ref 150–400)
RBC: 4.91 MIL/uL (ref 4.22–5.81)
RBC: 5.14 MIL/uL (ref 4.22–5.81)
RDW: 14.2 % (ref 11.5–15.5)
RDW: 13.9 % (ref 11.5–15.5)
WBC: 7.4 10*3/uL (ref 4.0–10.5)
WBC: 6.4 10*3/uL (ref 4.0–10.5)
nRBC: 0 % (ref 0.0–0.2)
nRBC: 0 % (ref 0.0–0.2)

## 2022-12-25 MED ORDER — AMOXICILLIN-POT CLAVULANATE 875-125 MG PO TABS: 1.0000 | ORAL_TABLET | Freq: Two times a day (BID) | ORAL | 0 refills | Status: AC

## 2022-12-25 MED ORDER — AMOXICILLIN-POT CLAVULANATE 875-125 MG PO TABS
1.0000 | ORAL_TABLET | Freq: Two times a day (BID) | ORAL | 0 refills | Status: DC
  Filled 2022-12-25: qty 10, 5d supply, fill #0

## 2022-12-25 MED ORDER — DOCUSATE SODIUM 100 MG PO CAPS
100.0000 mg | ORAL_CAPSULE | Freq: Two times a day (BID) | ORAL | 0 refills | Status: DC
  Filled 2022-12-25: qty 10, 5d supply, fill #0

## 2022-12-25 MED ORDER — OXYCODONE-ACETAMINOPHEN 5-325 MG PO TABS: 1.0000 | ORAL_TABLET | ORAL | 0 refills | Status: DC | PRN

## 2022-12-25 MED ORDER — DOCUSATE SODIUM 100 MG PO CAPS: 100.0000 mg | ORAL_CAPSULE | Freq: Two times a day (BID) | ORAL | 0 refills | Status: DC

## 2022-12-25 MED ORDER — OXYCODONE-ACETAMINOPHEN 5-325 MG PO TABS
1.0000 | ORAL_TABLET | ORAL | 0 refills | Status: DC | PRN
  Filled 2022-12-25: qty 20, 4d supply, fill #0

## 2022-12-25 NOTE — Progress Notes (Signed)
2 Days Post-Op   Subjective/Chief Complaint: No complaints. Feeling better   Objective: Vital signs in last 24 hours: Temp:  [98 F (36.7 C)-98.4 F (36.9 C)] 98.3 F (36.8 C) (12/01 0605) Pulse Rate:  [57-61] 57 (12/01 0605) Resp:  [18-20] 18 (12/01 0605) BP: (129-143)/(63-89) 131/64 (12/01 0605) SpO2:  [95 %-98 %] 98 % (12/01 0605) Last BM Date : 12/24/22  Intake/Output from previous day: 11/30 0701 - 12/01 0700 In: 1060 [P.O.:960; IV Piggyback:100] Out: 1200 [Urine:1200] Intake/Output this shift: No intake/output data recorded.  General appearance: alert and cooperative Resp: clear to auscultation bilaterally Cardio: regular rate and rhythm GI: soft, minimal tenderness  Lab Results:  Recent Labs    12/24/22 0452 12/25/22 0431  WBC 8.5 7.4  HGB 13.1 13.6  HCT 40.0 42.0  PLT 241 288   BMET Recent Labs    12/24/22 0452 12/25/22 0431  NA 137 137  K 3.8 4.3  CL 102 103  CO2 26 28  GLUCOSE 90 99  BUN 14 15  CREATININE 1.06 1.07  CALCIUM 9.0 9.1   PT/INR No results for input(s): "LABPROT", "INR" in the last 72 hours. ABG No results for input(s): "PHART", "HCO3" in the last 72 hours.  Invalid input(s): "PCO2", "PO2"  Studies/Results: DG ERCP  Result Date: 12/23/2022 CLINICAL DATA:  Choledocholithiasis. EXAM: ERCP TECHNIQUE: Multiple spot images obtained with the fluoroscopic device and submitted for interpretation post-procedure. FLUOROSCOPY TIME: FLUOROSCOPY TIME 3 minutes, 56 seconds (74.8 mGy) COMPARISON:  MRCP-12/22/2022 FINDINGS: Ten spot fluoroscopic images of the right upper abdominal quadrant during ERCP are provided for review. Initial image demonstrates an ERCP probe overlying the right upper abdominal quadrant. Subsequent images demonstrate placement of a pancreatic stent and selective cannulation and opacification of the common bile duct. Subsequent images demonstrate insufflation of a balloon within the central aspect of the CBD with subsequent  presumed biliary sweeping, stone extraction and sphincterotomy. Completion image demonstrates placement of a CBD stent with unchanged positioning of pancreatic stent. IMPRESSION: ERCP with presumed biliary sweeping, stone extraction and sphincterotomy and CBD and pancreatic stent placements as above. These images were submitted for radiologic interpretation only. Please see the procedural report for the amount of contrast and the fluoroscopy time utilized. Electronically Signed   By: Simonne Come M.D.   On: 12/23/2022 13:13   DG C-Arm 1-60 Min-No Report  Result Date: 12/23/2022 Fluoroscopy was utilized by the requesting physician.  No radiographic interpretation.    Anti-infectives: Anti-infectives (From admission, onward)    Start     Dose/Rate Route Frequency Ordered Stop   12/23/22 1900  Ampicillin-Sulbactam (UNASYN) 3 g in sodium chloride 0.9 % 100 mL IVPB        3 g 200 mL/hr over 30 Minutes Intravenous Every 6 hours 12/23/22 1845     12/23/22 0900  ampicillin-sulbactam (UNASYN) 1.5 g in sodium chloride 0.9 % 100 mL IVPB        1.5 g 200 mL/hr over 30 Minutes Intravenous  Once 12/23/22 0851 12/23/22 0956       Assessment/Plan: s/p Procedure(s): ENDOSCOPIC RETROGRADE CHOLANGIOPANCREATOGRAPHY (ERCP) (N/A) SPHINCTEROTOMY BILIARY DILATION BILIARY STENT PLACEMENT (N/A) PANCREATIC STENT PLACEMENT [facility] plans for outpt repeat ERCP. Once cbd stone is cleared then we will plan for lap chole. He can follow up with [place] in next couple weeks after d/c  LOS: 4 days    Tony Walters 12/25/2022

## 2022-12-25 NOTE — Progress Notes (Signed)
KUB to check for pancreatic stent

## 2022-12-25 NOTE — Progress Notes (Signed)
   Patient Name: Tony Walters Date of Encounter: 12/25/2022, 10:11 AM     Assessment and Plan  Choledocholithiasis-status post ERCP November 29 with sphincterotomy and sphincteroplasty (10 mm), unable to remove stone so plastic biliary stent placed.  He is improved again today.  Prophylactic pancreatic duct stent has been placed also  Cholelithiasis-surgery suggest elective cholecystectomy once bile duct is cleared.   --------------------------------------------------------------------------------------------------  I think both follow-up ERCP and cholecystectomy are elective and could be done later on in the outpatient setting.  I left a bile duct stent because of the difficulty with edema in the periampullary area and distal bile duct.  That should provide adequate drainage and may actually reduce the size of the stone and make it easier to remove later or we could try lithotripsy if needed.  He is improved enough to dc I think and so does Dr. Carolynne Edouard.  I will arrange outpatient KUB to check pancreatic stent also a f/u ERCP w/ bile duct stent removal and stone retrieval  See DC instructions - labs and xray at my office 12/9 and then I will coordinate timing of ERCP  Iva Boop, MD, [facility] Gastroenterology See AMION on call - gastroenterology for best contact person 12/25/2022 10:11 AM    Subjective  Ate solid food yesterday and says sensation of something moving at times less frequent and less intense.  Surgery has seen the patient and thinks cholecystectomy indicated but it is elective.     Objective  BP 131/64 (BP Location: Right Arm)   Pulse (!) 57   Temp 98.3 F (36.8 C) (Oral)   Resp 18   Ht 6\' 2"  (1.88 m)   Wt 120.2 kg   SpO2 98%   BMI 34.02 kg/m  Abd is soft and minimally tender in epigastrium   Recent Labs  Lab 12/21/22 1522 12/22/22 0509 12/23/22 0440 12/24/22 0452 12/25/22 0431  AST 63* 48* 38 45* 44*  ALT 147* 117* 101* 92* 90*   ALKPHOS 155* 115 111 120 129*  BILITOT 1.3* 1.6* 1.3* 0.8 0.7  PROT 7.7 6.7 6.9 6.8 7.1  ALBUMIN 3.9 3.4* 3.4* 3.3* 3.4*    Recent Labs  Lab 12/21/22 1537 12/22/22 0509 12/23/22 0440 12/24/22 0452 12/25/22 0431  NA 137 136 135 137 137  K 4.0 3.7 4.2 3.8 4.3  CL 103 105 102 102 103  CO2  --  22 25 26 28   GLUCOSE 106* 89 97 90 99  BUN 10 13 14 14 15   CREATININE 1.20 1.01 1.01 1.06 1.07  CALCIUM  --  8.6* 8.9 9.0 9.1   Lab Results  Component Value Date   LIPASE 31 12/24/2022   Recent Labs  Lab 12/23/22 0440 12/24/22 0452 12/25/22 0431  HGB 13.3 13.1 13.6  HCT 41.3 40.0 42.0  WBC 8.0 8.5 7.4  PLT 222 241 288     Iva Boop, MD, [facility]astroenterology See AMION on call - gastroenterology for best contact person 12/25/2022 10:11 AM

## 2022-12-25 NOTE — Plan of Care (Signed)

## 2022-12-25 NOTE — Progress Notes (Signed)
Reviewed written d/c instructions w pt and all questions answered. He verbalized understanding. D/ C via w/c w all belongings in stable condition.

## 2022-12-26 ENCOUNTER — Ambulatory Visit: Payer: BC Managed Care – PPO | Admitting: Family Medicine

## 2022-12-27 NOTE — Discharge Summary (Signed)
Physician Discharge Summary   Patient: Tony Walters MRN: [id number] DOB: [birth year]  Admit date:     12/21/2022  Discharge date: 12/25/2022  Discharge Physician: Lynden Oxford  PCP: Sharlene Dory, DO  Recommendations at discharge:  Follow up with Gastroenterology and General surgery as recommended   Follow-up Information     Chevis Pretty III, MD Follow up in 2 week(s).   Specialty: General Surgery Contact information: [address] [place] [address] [phone]         Iva Boop, MD Follow up on 01/02/2023.   Specialty: Gastroenterology Why: Go to basement for xray and bloodwork (labs) - anytime between 730 AM and 5PM - no appointment needed - tell them you have orders for the xray and the labs - Dr. Leone Payor will call with results and next steps Contact information: [address] [place] [address] [phone]                Discharge Diagnoses: Principal Problem:   Acquired dilation of common bile duct Active Problems:   Essential hypertension   Hypothyroidism   Depression with anxiety  Brief hospital course: Tony Walters is a 47 y.o. male with medical history significant for hypertension, hypothyroidism, hyperlipidemia, prior heavy alcohol use, and severe hepatic steatosis who presents with epigastric pain.  Found to have choledocholithiasis with biliary colic.  Currently GI consulted.   Underwent ERCP.  Stent placed as stone was unable to be removed due to edema.  General surgery consulted.  Recommended to initiate IV antibiotics.  Assessment and Plan: Biliary colic with choledocholithiasis. Presented with epigastric pain. AST ALT elevated. CBD dilated. Lipase level normal. Bilirubin mildly elevated but improving. [place] [facility] consulted. 11/29 underwent ERCP.  9 to 10 mm stone size seen with intrahepatic dilation without gallbladder fill.  But due to sphincter edema unable to remove the stone. A plastic  stent was placed. Also evidence of distal CBD narrowing. General surgery consulted. Lap chole recommended although currently timing will depend on GIs plan. GI has informed me that they are planning outpatient ERCP. per GI they are okay with patient undergoing lap chole as well. General surgery recommended to initiate antibiotic and will see the pt in clinic to decide plan    Hypothyroidism. Continue Synthroid.   Depression. Continue Cymbalta.   Blood pressure. Currently stable.   Prior history of steatosis. Reported history of alcohol abuse but currently does not drink heavily. For now monitoring. Has seen hepatology outpatient.   Obesity Class 1 Body mass index is 34.02 kg/m.  Placing the pt at higher risk of poor outcomes.  Pain control - [employer] Controlled Substance Reporting System database was reviewed. and patient was instructed, not to drive, operate heavy machinery, perform activities at heights, swimming or participation in water activities or provide baby-sitting services while on Pain, Sleep and Anxiety Medications; until their outpatient Physician has advised to do so again. Also recommended to not to take more than prescribed Pain, Sleep and Anxiety Medications.  Consultants:  Gastroenterology  General surgery   Procedures performed:  ERCP  DISCHARGE MEDICATION: Allergies as of 12/25/2022   No Known Allergies      Medication List     TAKE these medications    amoxicillin-clavulanate 875-125 MG tablet Commonly known as: AUGMENTIN Take 1 tablet by mouth 2 (two) times daily for 5 days.   celecoxib 200 MG capsule Commonly known as: CeleBREX Take 1 capsule (200 mg total) by mouth 2 (  two) times daily.   docusate sodium 100 MG capsule Commonly known as: COLACE Take 1 capsule (100 mg total) by mouth 2 (two) times daily.   DULoxetine 60 MG capsule Commonly known as: Cymbalta Take 1 capsule (60 mg total) by mouth daily.   fenofibrate 48 MG  tablet Commonly known as: Tricor Take 1 tablet (48 mg total) by mouth daily.   levothyroxine 75 MCG tablet Commonly known as: SYNTHROID Take 1 tablet (75 mcg total) by mouth daily.   olmesartan-hydrochlorothiazide 40-25 MG tablet Commonly known as: BENICAR HCT Take 1 tablet by mouth daily.   oxyCODONE-acetaminophen 5-325 MG tablet Commonly known as: Percocet Take 1 tablet by mouth every 4 (four) hours as needed for severe pain (pain score 7-10).   pravastatin 40 MG tablet Commonly known as: PRAVACHOL Take 1 tablet (40 mg total) by mouth daily.   sildenafil 100 MG tablet Commonly known as: Viagra Take 1/2-1 tablets (50-100 mg total) by mouth daily as needed for erectile dysfunction.   tiZANidine 4 MG tablet Commonly known as: Zanaflex Take 1 tablet (4 mg total) by mouth every 6 (six) hours as needed for muscle spasms.       Disposition: Home Diet recommendation: Cardiac diet  Discharge Exam: Vitals:   12/24/22 0548 12/24/22 1336 12/24/22 2051 12/25/22 0605  BP: 117/81 (!) 143/89 129/63 131/64  Pulse: (!) 58 61 (!) 57 (!) 57  Resp: 18 20 18 18   Temp: 97.7 F (36.5 C) 98.4 F (36.9 C) 98 F (36.7 C) 98.3 F (36.8 C)  TempSrc: Oral Oral Oral Oral  SpO2: 95% 95% 97% 98%  Weight:      Height:       General: Appear in mild distress; no visible Abnormal Neck Mass Or lumps, Conjunctiva normal Cardiovascular: S1 and S2 Present, no Murmur, Respiratory: good respiratory effort, Bilateral Air entry present and CTA, no Crackles, no wheezes Abdomen: Bowel Sound present, Non tender Extremities: no Pedal edema Neurology: alert and oriented to time, place, and person  Filed Weights   12/21/22 1514 12/23/22 0859  Weight: 120.2 kg 120.2 kg   Condition at discharge: stable  The results of significant diagnostics from this hospitalization (including imaging, microbiology, ancillary and laboratory) are listed below for reference.   Imaging Studies: DG Chest 2 View  Result  Date: 12/23/2022 CLINICAL DATA:  Right-sided chest pain for the past 3 weeks. EXAM: CHEST - 2 VIEW COMPARISON:  01/02/2020 FINDINGS: Unchanged cardiac silhouette and mediastinal contours. There is mild diffuse slightly nodular thickening of the pulmonary interstitium. No discrete focal airspace opacities. No pleural effusion or pneumothorax no evidence of edema. No acute osseous abnormalities. IMPRESSION: Chronic bronchitic change without superimposed acute cardiopulmonary disease. Electronically Signed   By: Simonne Come M.D.   On: 12/23/2022 13:14   DG ERCP  Result Date: 12/23/2022 CLINICAL DATA:  Choledocholithiasis. EXAM: ERCP TECHNIQUE: Multiple spot images obtained with the fluoroscopic device and submitted for interpretation post-procedure. FLUOROSCOPY TIME: FLUOROSCOPY TIME 3 minutes, 56 seconds (74.8 mGy) COMPARISON:  MRCP-12/22/2022 FINDINGS: Ten spot fluoroscopic images of the right upper abdominal quadrant during ERCP are provided for review. Initial image demonstrates an ERCP probe overlying the right upper abdominal quadrant. Subsequent images demonstrate placement of a pancreatic stent and selective cannulation and opacification of the common bile duct. Subsequent images demonstrate insufflation of a balloon within the central aspect of the CBD with subsequent presumed biliary sweeping, stone extraction and sphincterotomy. Completion image demonstrates placement of a CBD stent with unchanged positioning of pancreatic  stent. IMPRESSION: ERCP with presumed biliary sweeping, stone extraction and sphincterotomy and CBD and pancreatic stent placements as above. These images were submitted for radiologic interpretation only. Please see the procedural report for the amount of contrast and the fluoroscopy time utilized. Electronically Signed   By: Simonne Come M.D.   On: 12/23/2022 13:13   DG C-Arm 1-60 Min-No Report  Result Date: 12/23/2022 Fluoroscopy was utilized by the requesting physician.  No  radiographic interpretation.   MR ABDOMEN MRCP W WO CONTAST  Result Date: 12/22/2022 CLINICAL DATA:  Right upper quadrant pain for 5 days. Cholelithiasis and biliary ductal dilatation on recent ultrasound. EXAM: MRI ABDOMEN WITHOUT AND WITH CONTRAST (INCLUDING MRCP) TECHNIQUE: Multiplanar multisequence MR imaging of the abdomen was performed both before and after the administration of intravenous contrast. Heavily T2-weighted images of the biliary and pancreatic ducts were obtained, and three-dimensional MRCP images were rendered by post processing. CONTRAST:  10mL GADAVIST GADOBUTROL 1 MMOL/ML IV SOLN COMPARISON:  None Available. FINDINGS: Lower chest: No acute findings. Hepatobiliary: No hepatic masses identified. A few large gallstones are seen measuring up to 2.5 cm. No evidence of acute cholecystitis. Diffuse biliary ductal dilatation is seen with common bile duct measuring 11 mm in diameter. A 10 mm calculus is seen in the distal common bile duct, as well as sludge or other tiny calculi. Pancreas: No mass or inflammatory changes seen. Mild diffuse pancreatic ductal dilatation is noted, without signs of pancreas divisum. Spleen:  Within normal limits in size and appearance. Adrenals/Urinary Tract: No suspicious masses identified. No evidence of hydronephrosis. Stomach/Bowel: Unremarkable. Vascular/Lymphatic: No pathologically enlarged lymph nodes identified. No acute vascular findings. Other:  None. Musculoskeletal:  No suspicious bone lesions identified. IMPRESSION: Cholelithiasis. No radiographic evidence of acute cholecystitis. Diffuse biliary ductal dilatation, with 10 mm calculus and sludge in distal common bile duct. Mild diffuse pancreatic ductal dilatation. No radiographic evidence of pancreatic mass or acute pancreatitis. Electronically Signed   By: Danae Orleans M.D.   On: 12/22/2022 09:24   MR 3D Recon At Scanner  Result Date: 12/22/2022 CLINICAL DATA:  Right upper quadrant pain for 5 days.  Cholelithiasis and biliary ductal dilatation on recent ultrasound. EXAM: MRI ABDOMEN WITHOUT AND WITH CONTRAST (INCLUDING MRCP) TECHNIQUE: Multiplanar multisequence MR imaging of the abdomen was performed both before and after the administration of intravenous contrast. Heavily T2-weighted images of the biliary and pancreatic ducts were obtained, and three-dimensional MRCP images were rendered by post processing. CONTRAST:  10mL GADAVIST GADOBUTROL 1 MMOL/ML IV SOLN COMPARISON:  None Available. FINDINGS: Lower chest: No acute findings. Hepatobiliary: No hepatic masses identified. A few large gallstones are seen measuring up to 2.5 cm. No evidence of acute cholecystitis. Diffuse biliary ductal dilatation is seen with common bile duct measuring 11 mm in diameter. A 10 mm calculus is seen in the distal common bile duct, as well as sludge or other tiny calculi. Pancreas: No mass or inflammatory changes seen. Mild diffuse pancreatic ductal dilatation is noted, without signs of pancreas divisum. Spleen:  Within normal limits in size and appearance. Adrenals/Urinary Tract: No suspicious masses identified. No evidence of hydronephrosis. Stomach/Bowel: Unremarkable. Vascular/Lymphatic: No pathologically enlarged lymph nodes identified. No acute vascular findings. Other:  None. Musculoskeletal:  No suspicious bone lesions identified. IMPRESSION: Cholelithiasis. No radiographic evidence of acute cholecystitis. Diffuse biliary ductal dilatation, with 10 mm calculus and sludge in distal common bile duct. Mild diffuse pancreatic ductal dilatation. No radiographic evidence of pancreatic mass or acute pancreatitis. Electronically Signed   By:  Danae Orleans M.D.   On: 12/22/2022 09:24   US Abdomen Limited RUQ (LIVER/GB)  Result Date: 12/21/2022 CLINICAL DATA:  Right upper quadrant pain EXAM: ULTRASOUND ABDOMEN LIMITED RIGHT UPPER QUADRANT COMPARISON:  Ultrasound 05/08/2020 FINDINGS: Gallbladder: Contracted gallbladder with large  2.8 cm stone. No gallbladder wall thickening. No pericholecystic fluid. Indeterminate sonographic Murphy's sign due to pain medications. Common bile duct: Diameter: 13 mm. Liver: No focal lesion identified. Heterogenous parenchymal echotexture. Mild contour nodularity. Portal vein is patent on color Doppler imaging with normal direction of blood flow towards the liver. Other: None. IMPRESSION: 1. Cholelithiasis without evidence of acute cholecystitis. 2. Dilated common bile duct. A stone in the distal common bile duct is not excluded. Recommend correlation with LFTs. If abnormal consider ERCP, EUS, or MRCP for further evaluation. 3. Cirrhotic liver morphology. Electronically Signed   By: Minerva Fester M.D.   On: 12/21/2022 19:18    Microbiology: Results for orders placed or performed in visit on 04/05/21  Cult, Fungus, Skin,Hair,Nail w/KOH     Status: None   Collection Time: 04/05/21 10:09 AM  Result Value Ref Range Status   MICRO NUMBER: [id number]  Final   SPECIMEN QUALITY: Adequate  Final   Source: SKIN  Final   STATUS: FINAL  Final   SMEAR: CANCELED      Comment: Result canceled by the ancillary.   CULTURE: No fungal growth at 4 Weeks  Final  Anaerobic and Aerobic Culture     Status: None   Collection Time: 04/05/21 10:09 AM  Result Value Ref Range Status   MICRO NUMBER: CANCELED      Comment: Result canceled by the ancillary.   SPECIMEN QUALITY: CANCELED      Comment: Result canceled by the ancillary.   Source: CANCELED      Comment: Result canceled by the ancillary.   STATUS: CANCELED      Comment: Result canceled by the ancillary.   GRAM STAIN: CANCELED      Comment: Result canceled by the ancillary.   ANA RESULT: CANCELED      Comment: Result canceled by the ancillary.   MICRO NUMBER: CANCELED      Comment: Result canceled by the ancillary.   SPECIMEN QUALITY: CANCELED      Comment: Result canceled by the ancillary.   SOURCE: CANCELED      Comment: Result canceled by the  ancillary.   STATUS: CANCELED      Comment: Result canceled by the ancillary.   AER RESULT: CANCELED      Comment: Result canceled by the ancillary.   Labs: CBC: Recent Labs  Lab 12/22/22 0509 12/23/22 0440 12/24/22 0452 12/25/22 0431 12/25/22 1242  WBC 8.6 8.0 8.5 7.4 6.4  HGB 13.2 13.3 13.1 13.6 14.1  HCT 41.2 41.3 40.0 42.0 43.6  MCV 86.0 86.2 86.0 85.5 84.8  PLT 195 222 241 288 306   Basic Metabolic Panel: Recent Labs  Lab 12/21/22 1537 12/22/22 0509 12/23/22 0440 12/24/22 0452 12/25/22 0431  NA 137 136 135 137 137  K 4.0 3.7 4.2 3.8 4.3  CL 103 105 102 102 103  CO2  --  22 25 26 28   GLUCOSE 106* 89 97 90 99  BUN 10 13 14 14 15   CREATININE 1.20 1.01 1.01 1.06 1.07  CALCIUM  --  8.6* 8.9 9.0 9.1   Liver Function Tests: Recent Labs  Lab 12/21/22 1522 12/22/22 0509 12/23/22 0440 12/24/22 0452 12/25/22 0431  AST 63* 48* 38 45* 44*  ALT 147* 117* 101* 92* 90*  ALKPHOS 155* 115 111 120 129*  BILITOT 1.3* 1.6* 1.3* 0.8 0.7  PROT 7.7 6.7 6.9 6.8 7.1  ALBUMIN 3.9 3.4* 3.4* 3.3* 3.4*   CBG: Recent Labs  Lab 12/21/22 1511  GLUCAP 125*    Discharge time spent: greater than 30 minutes.  Author: Lynden Oxford, MD  [facility] 12/25/2022

## 2022-12-28 ENCOUNTER — Ambulatory Visit: Payer: BC Managed Care – PPO | Admitting: Family Medicine

## 2022-12-29 ENCOUNTER — Other Ambulatory Visit: Payer: Self-pay

## 2022-12-29 ENCOUNTER — Telehealth: Payer: Self-pay | Admitting: Internal Medicine

## 2022-12-29 DIAGNOSIS — Z9889 Other specified postprocedural states: Secondary | ICD-10-CM

## 2022-12-29 DIAGNOSIS — K8050 Calculus of bile duct without cholangitis or cholecystitis without obstruction: Secondary | ICD-10-CM

## 2022-12-29 NOTE — Telephone Encounter (Signed)
Patient had ERCP with bile duct stent (could not remove stone) at hospital last week  I would like to get him on the schedule for my 1/16 schedule at [facility] ERCP - please order Unasyn 1.5 g IV and diclofenac 100 mg per rectum  on call  Please also remind him that he has labs and KUB 12/9 here (ordered)

## 2022-12-29 NOTE — Telephone Encounter (Signed)
Scheduled ERCP with patient for 02/09/23 at 10:15 am at [facility]. Amb ref & hospital orders placed. Instructions sent to patient through mychart. He has been reminded to come in for labs & kub, and advised on when/where to go. Pt verbalized all understanding & had no further questions.

## 2023-01-02 ENCOUNTER — Other Ambulatory Visit (HOSPITAL_BASED_OUTPATIENT_CLINIC_OR_DEPARTMENT_OTHER): Payer: Self-pay

## 2023-01-02 ENCOUNTER — Encounter: Payer: Self-pay | Admitting: Family Medicine

## 2023-01-02 ENCOUNTER — Ambulatory Visit (INDEPENDENT_AMBULATORY_CARE_PROVIDER_SITE_OTHER): Payer: BC Managed Care – PPO | Admitting: Family Medicine

## 2023-01-02 VITALS — BP 138/76 | HR 78 | Temp 98.0°F | Resp 16 | Ht 74.0 in | Wt 263.0 lb

## 2023-01-02 DIAGNOSIS — K8050 Calculus of bile duct without cholangitis or cholecystitis without obstruction: Secondary | ICD-10-CM

## 2023-01-02 DIAGNOSIS — R7989 Other specified abnormal findings of blood chemistry: Secondary | ICD-10-CM

## 2023-01-02 MED ORDER — OXYCODONE HCL 5 MG PO TABS
5.0000 mg | ORAL_TABLET | ORAL | 0 refills | Status: DC | PRN
  Filled 2023-01-02: qty 20, 4d supply, fill #0

## 2023-01-02 NOTE — Progress Notes (Signed)
Chief Complaint  Patient presents with   Follow-up    Follow up    Subjective: Patient is a 47 y.o. male here for hospital follow up.  Patient was admitted to [facility] on 12/21/2022 discharged in 12/25/2022 for acquired dilatation of the common bile duct secondary to gallstones.  He underwent ERCP and a stent was placed as a stone was unable to be removed due to edema.  He reports marked improvement in his pain.  He is urinating and defecating normally again.  No nausea or vomiting.  He has a procedure with the general surgery team next month.  Past Medical History:  Diagnosis Date   Chicken pox as a child   Fatty liver    Grief reaction 04/04/2013   HTN (hypertension) 04/04/2013   PTSD (post-traumatic stress disorder)    Unspecified hypothyroidism 04/07/2013    Objective: BP 138/76 (BP Location: Left Arm, Patient Position: Sitting, Cuff Size: Normal)   Pulse 78   Temp 98 F (36.7 C) (Oral)   Resp 16   Ht 6\' 2"  (1.88 m)   Wt 263 lb (119.3 kg)   SpO2 98%   BMI 33.77 kg/m  General: Awake, appears stated age Heart: RRR, no LE edema Lungs: CTAB, no rales, wheezes or rhonchi. No accessory muscle use Abdomen: Bowel sounds present, soft, nontender, nondistended Mouth: MMM Psych: Age appropriate judgment and insight, normal affect and mood  Assessment and Plan: Elevated LFTs - Plan: Comprehensive metabolic panel  Calculus of bile duct without cholecystitis and without obstruction  Check labs.  Oxycodone reordered for as needed pain relief.  Warnings about this medicine verbalized and written down.  Appreciate general surgery and GI input.  Follow-up as originally scheduled. The patient voiced understanding and agreement to the plan.  Jilda Roche [place], DO 01/02/23  4:59 PM

## 2023-01-02 NOTE — Patient Instructions (Signed)
Heat (pad or rice pillow in microwave) over affected area, 10-15 minutes twice daily.   Ice/cold pack over area for 10-15 min twice daily.  Do not drink alcohol, do any illicit/street drugs, drive or do anything that requires alertness while on this medicine.   Give [place] 2-3 business days to get the results of your labs back.   Let us know if you need anything.  EXERCISES RANGE OF MOTION (ROM) AND STRETCHING EXERCISES  These exercises may help you when beginning to rehabilitate your issue. In order to successfully resolve your symptoms, you must improve your posture. These exercises are designed to help reduce the forward-head and rounded-shoulder posture which contributes to this condition. Your symptoms may resolve with or without further involvement from your physician, physical therapist or athletic trainer. While completing these exercises, remember:  Restoring tissue flexibility helps normal motion to return to the joints. This allows healthier, less painful movement and activity. An effective stretch should be held for at least 20 seconds, although you may need to begin with shorter hold times for comfort. A stretch should never be painful. You should only feel a gentle lengthening or release in the stretched tissue. Do not do any stretch or exercise that you cannot tolerate.  STRETCH- Axial Extensors Lie on your back on the floor. You may bend your knees for comfort. Place a rolled-up hand towel or dish towel, about 2 inches in diameter, under the part of your head that makes contact with the floor. Gently tuck your chin, as if trying to make a "double chin," until you feel a gentle stretch at the base of your head. Hold 15-20 seconds. Repeat 2-3 times. Complete this exercise 1 time per day.   STRETCH - Axial Extension  Stand or sit on a firm surface. Assume a good posture: chest up, shoulders drawn back, abdominal muscles slightly tense, knees unlocked (if standing) and feet hip width  apart. Slowly retract your chin so your head slides back and your chin slightly lowers. Continue to look straight ahead. You should feel a gentle stretch in the back of your head. Be certain not to feel an aggressive stretch since this can cause headaches later. Hold for 15-20 seconds. Repeat 2-3 times. Complete this exercise 1 time per day.  STRETCH - Cervical Side Bend  Stand or sit on a firm surface. Assume a good posture: chest up, shoulders drawn back, abdominal muscles slightly tense, knees unlocked (if standing) and feet hip width apart. Without letting your nose or shoulders move, slowly tip your right / left ear to your shoulder until your feel a gentle stretch in the muscles on the opposite side of your neck. Hold 15-20 seconds. Repeat 2-3 times. Complete this exercise 1-2 times per day.  STRETCH - Cervical Rotators  Stand or sit on a firm surface. Assume a good posture: chest up, shoulders drawn back, abdominal muscles slightly tense, knees unlocked (if standing) and feet hip width apart. Keeping your eyes level with the ground, slowly turn your head until you feel a gentle stretch along the back and opposite side of your neck. Hold 15-20 seconds. Repeat 2-3 times. Complete this exercise 1-2 times per day.  RANGE OF MOTION - Neck Circles  Stand or sit on a firm surface. Assume a good posture: chest up, shoulders drawn back, abdominal muscles slightly tense, knees unlocked (if standing) and feet hip width apart. Gently roll your head down and around from the back of one shoulder to the back of  the other. The motion should never be forced or painful. Repeat the motion 10-20 times, or until you feel the neck muscles relax and loosen. Repeat 2-3 times. Complete the exercise 1-2 times per day. STRENGTHENING EXERCISES - Cervical Strain and Sprain These exercises may help you when beginning to rehabilitate your injury. They may resolve your symptoms with or without further involvement  from your physician, physical therapist, or athletic trainer. While completing these exercises, remember:  Muscles can gain both the endurance and the strength needed for everyday activities through controlled exercises. Complete these exercises as instructed by your physician, physical therapist, or athletic trainer. Progress the resistance and repetitions only as guided. You may experience muscle soreness or fatigue, but the pain or discomfort you are trying to eliminate should never worsen during these exercises. If this pain does worsen, stop and make certain you are following the directions exactly. If the pain is still present after adjustments, discontinue the exercise until you can discuss the trouble with your clinician.  STRENGTH - Cervical Flexors, Isometric Face a wall, standing about 6 inches away. Place a small pillow, a ball about 6-8 inches in diameter, or a folded towel between your forehead and the wall. Slightly tuck your chin and gently push your forehead into the soft object. Push only with mild to moderate intensity, building up tension gradually. Keep your jaw and forehead relaxed. Hold 10 to 20 seconds. Keep your breathing relaxed. Release the tension slowly. Relax your neck muscles completely before you start the next repetition. Repeat 2-3 times. Complete this exercise 1 time per day.  STRENGTH- Cervical Lateral Flexors, Isometric  Stand about 6 inches away from a wall. Place a small pillow, a ball about 6-8 inches in diameter, or a folded towel between the side of your head and the wall. Slightly tuck your chin and gently tilt your head into the soft object. Push only with mild to moderate intensity, building up tension gradually. Keep your jaw and forehead relaxed. Hold 10 to 20 seconds. Keep your breathing relaxed. Release the tension slowly. Relax your neck muscles completely before you start the next repetition. Repeat 2-3 times. Complete this exercise 1 time per  day.  STRENGTH - Cervical Extensors, Isometric  Stand about 6 inches away from a wall. Place a small pillow, a ball about 6-8 inches in diameter, or a folded towel between the back of your head and the wall. Slightly tuck your chin and gently tilt your head back into the soft object. Push only with mild to moderate intensity, building up tension gradually. Keep your jaw and forehead relaxed. Hold 10 to 20 seconds. Keep your breathing relaxed. Release the tension slowly. Relax your neck muscles completely before you start the next repetition. Repeat 2-3 times. Complete this exercise 1 time per day.  POSTURE AND BODY MECHANICS CONSIDERATIONS Keeping correct posture when sitting, standing or completing your activities will reduce the stress put on different body tissues, allowing injured tissues a chance to heal and limiting painful experiences. The following are general guidelines for improved posture. Your physician or physical therapist will provide you with any instructions specific to your needs. While reading these guidelines, remember: The exercises prescribed by your provider will help you have the flexibility and strength to maintain correct postures. The correct posture provides the optimal environment for your joints to work. All of your joints have less wear and tear when properly supported by a spine with good posture. This means you will experience a healthier, less  painful body. Correct posture must be practiced with all of your activities, especially prolonged sitting and standing. Correct posture is as important when doing repetitive low-stress activities (typing) as it is when doing a single heavy-load activity (lifting).  PROLONGED STANDING WHILE SLIGHTLY LEANING FORWARD When completing a task that requires you to lean forward while standing in one place for a long time, place either foot up on a stationary 2- to 4-inch high object to help maintain the best posture. When both feet are  on the ground, the low back tends to lose its slight inward curve. If this curve flattens (or becomes too large), then the back and your other joints will experience too much stress, fatigue more quickly, and can cause pain.   RESTING POSITIONS Consider which positions are most painful for you when choosing a resting position. If you have pain with flexion-based activities (sitting, bending, stooping, squatting), choose a position that allows you to rest in a less flexed posture. You would want to avoid curling into a fetal position on your side. If your pain worsens with extension-based activities (prolonged standing, working overhead), avoid resting in an extended position such as sleeping on your stomach. Most people will find more comfort when they rest with their spine in a more neutral position, neither too rounded nor too arched. Lying on a non-sagging bed on your side with a pillow between your knees, or on your back with a pillow under your knees will often provide some relief. Keep in mind, being in any one position for a prolonged period of time, no matter how correct your posture, can still lead to stiffness.  WALKING Walk with an upright posture. Your ears, shoulders, and hips should all line up. OFFICE WORK When working at a desk, create an environment that supports good, upright posture. Without extra support, muscles fatigue and lead to excessive strain on joints and other tissues.  CHAIR: A chair should be able to slide under your desk when your back makes contact with the back of the chair. This allows you to work closely. The chair's height should allow your eyes to be level with the upper part of your monitor and your hands to be slightly lower than your elbows. Body position: Your feet should make contact with the floor. If this is not possible, use a foot rest. Keep your ears over your shoulders. This will reduce stress on your neck and low back.

## 2023-01-03 ENCOUNTER — Telehealth: Payer: Self-pay | Admitting: Internal Medicine

## 2023-01-03 DIAGNOSIS — Z9889 Other specified postprocedural states: Secondary | ICD-10-CM

## 2023-01-03 LAB — COMPREHENSIVE METABOLIC PANEL
ALT: 64 U/L — ABNORMAL HIGH (ref 0–53)
AST: 33 U/L (ref 0–37)
Albumin: 4.3 g/dL (ref 3.5–5.2)
Alkaline Phosphatase: 113 U/L (ref 39–117)
BUN: 15 mg/dL (ref 6–23)
CO2: 29 meq/L (ref 19–32)
Calcium: 9.6 mg/dL (ref 8.4–10.5)
Chloride: 105 meq/L (ref 96–112)
Creatinine, Ser: 1.11 mg/dL (ref 0.40–1.50)
GFR: 78.17 mL/min (ref >60.00)
Glucose, Bld: 87 mg/dL (ref 70–99)
Potassium: 4.5 meq/L (ref 3.5–5.1)
Sodium: 142 meq/L (ref 135–145)
Total Bilirubin: 0.5 mg/dL (ref 0.2–1.2)
Total Protein: 7 g/dL (ref 6.0–8.3)

## 2023-01-03 NOTE — Telephone Encounter (Signed)
Patient is supposed to have a KUB done to check to see if pancreatic stent remains  I had ordered it for 12/9  He saw PCP and had labs drawn that are pending (he was also supposed to get these here but ok he did there)  I do need the KUB done  If he wants to change it to another xray location that is ok  The reason is to check for retained pancreatic stent  Encounter Diagnosis  Name Primary?   History of insertion of pancreatic stent Yes

## 2023-01-04 NOTE — Telephone Encounter (Signed)
Pt made aware of Dr. Leone Payor recommendations: Pt stated that he will come by tomorrow and have it done.  Location provided. Pt verbalized understanding with all questions answered.

## 2023-01-05 ENCOUNTER — Ambulatory Visit
Admission: RE | Admit: 2023-01-05 | Discharge: 2023-01-05 | Disposition: A | Discharge status: Home / Self Care | Payer: BC Managed Care – PPO | Source: Ambulatory Visit | Attending: Internal Medicine | Admitting: Internal Medicine

## 2023-01-05 DIAGNOSIS — Z9889 Other specified postprocedural states: Secondary | ICD-10-CM | POA: Diagnosis not present

## 2023-01-08 ENCOUNTER — Telehealth: Payer: Self-pay | Admitting: Internal Medicine

## 2023-01-08 NOTE — Telephone Encounter (Signed)
Please tell patient that xray reviewed and the pancreatic stent passed as we wanted it to - will await final reading from radiologist also and update if needed  Bile duct stent remains as expected - I will see him in jan to remove that and get the stone out

## 2023-01-09 NOTE — Telephone Encounter (Signed)
Pt made aware of recent results and Dr. Gessner recommendations: Pt verbalized understanding with all questions answered.   

## 2023-01-22 ENCOUNTER — Other Ambulatory Visit: Payer: Self-pay | Admitting: Family Medicine

## 2023-01-23 ENCOUNTER — Other Ambulatory Visit (HOSPITAL_BASED_OUTPATIENT_CLINIC_OR_DEPARTMENT_OTHER): Payer: Self-pay

## 2023-01-31 ENCOUNTER — Telehealth: Payer: Self-pay

## 2023-01-31 ENCOUNTER — Encounter (HOSPITAL_COMMUNITY): Payer: Self-pay | Admitting: Internal Medicine

## 2023-01-31 NOTE — Telephone Encounter (Signed)
 Pt was made aware of Dr. Leone Payor recommendations:

## 2023-02-08 NOTE — Anesthesia Preprocedure Evaluation (Addendum)
Anesthesia Evaluation  Patient identified by MRN, date of birth, ID band Patient awake    Reviewed: Allergy & Precautions, NPO status , Patient's Chart, lab work & pertinent test results  Airway Mallampati: III  TM Distance: >3 FB Neck ROM: Full    Dental no notable dental hx. (+) Teeth Intact   Pulmonary Current SmokerPatient did not abstain from smoking.   Pulmonary exam normal breath sounds clear to auscultation       Cardiovascular hypertension, Normal cardiovascular exam Rhythm:Regular Rate:Normal     Neuro/Psych  PSYCHIATRIC DISORDERS Anxiety        GI/Hepatic negative GI ROS, Neg liver ROS,,,  Endo/Other  Hypothyroidism    Renal/GU      Musculoskeletal   Abdominal   Peds  Hematology  (+) REFUSES BLOOD PRODUCTS  Anesthesia Other Findings   Reproductive/Obstetrics                             Anesthesia Physical Anesthesia Plan  ASA: 2  Anesthesia Plan: General   Post-op Pain Management: Minimal or no pain anticipated   Induction: Intravenous  PONV Risk Score and Plan: 2 and Treatment may vary due to age or medical condition and Ondansetron  Airway Management Planned: Oral ETT  Additional Equipment: None  Intra-op Plan:   Post-operative Plan: Extubation in OR  Informed Consent: I have reviewed the patients History and Physical, chart, labs and discussed the procedure including the risks, benefits and alternatives for the proposed anesthesia with the patient or authorized representative who has indicated his/her understanding and acceptance.     Dental advisory given  Plan Discussed with: CRNA and Anesthesiologist  Anesthesia Plan Comments: (ERCP for CBD Stone)        Anesthesia Quick Evaluation

## 2023-02-09 ENCOUNTER — Ambulatory Visit (HOSPITAL_COMMUNITY)
Admission: RE | Admit: 2023-02-09 | Discharge: 2023-02-09 | Disposition: A | Discharge status: Home / Self Care | Payer: BC Managed Care – PPO | Attending: Internal Medicine | Admitting: Internal Medicine

## 2023-02-09 ENCOUNTER — Encounter (HOSPITAL_COMMUNITY)
Admission: RE | Disposition: A | Discharge status: Home / Self Care | Payer: Self-pay | Source: Home / Self Care | Attending: Internal Medicine

## 2023-02-09 ENCOUNTER — Other Ambulatory Visit: Payer: Self-pay

## 2023-02-09 ENCOUNTER — Ambulatory Visit (HOSPITAL_COMMUNITY): Payer: BC Managed Care – PPO

## 2023-02-09 ENCOUNTER — Encounter (HOSPITAL_COMMUNITY): Payer: Self-pay | Admitting: Internal Medicine

## 2023-02-09 ENCOUNTER — Ambulatory Visit (HOSPITAL_COMMUNITY): Payer: Self-pay | Admitting: Anesthesiology

## 2023-02-09 DIAGNOSIS — I10 Essential (primary) hypertension: Secondary | ICD-10-CM | POA: Diagnosis not present

## 2023-02-09 DIAGNOSIS — K8050 Calculus of bile duct without cholangitis or cholecystitis without obstruction: Secondary | ICD-10-CM | POA: Insufficient documentation

## 2023-02-09 DIAGNOSIS — K5710 Diverticulosis of small intestine without perforation or abscess without bleeding: Secondary | ICD-10-CM | POA: Diagnosis not present

## 2023-02-09 DIAGNOSIS — F17210 Nicotine dependence, cigarettes, uncomplicated: Secondary | ICD-10-CM | POA: Diagnosis not present

## 2023-02-09 DIAGNOSIS — E039 Hypothyroidism, unspecified: Secondary | ICD-10-CM | POA: Insufficient documentation

## 2023-02-09 DIAGNOSIS — K838 Other specified diseases of biliary tract: Secondary | ICD-10-CM | POA: Diagnosis not present

## 2023-02-09 DIAGNOSIS — Z79890 Hormone replacement therapy (postmenopausal): Secondary | ICD-10-CM | POA: Insufficient documentation

## 2023-02-09 DIAGNOSIS — Z9889 Other specified postprocedural states: Secondary | ICD-10-CM

## 2023-02-09 DIAGNOSIS — K8070 Calculus of gallbladder and bile duct without cholecystitis without obstruction: Secondary | ICD-10-CM | POA: Insufficient documentation

## 2023-02-09 HISTORY — PX: REMOVAL OF STONES: SHX5545

## 2023-02-09 HISTORY — PX: STENT REMOVAL: SHX6421

## 2023-02-09 HISTORY — DX: Calculus of bile duct without cholangitis or cholecystitis without obstruction: K80.50

## 2023-02-09 HISTORY — PX: STONE EXTRACTION WITH BASKET: SHX5318

## 2023-02-09 HISTORY — PX: ENDOSCOPIC RETROGRADE CHOLANGIOPANCREATOGRAPHY (ERCP) WITH PROPOFOL: SHX5810

## 2023-02-09 HISTORY — DX: Calculus of gallbladder without cholecystitis without obstruction: K80.20

## 2023-02-09 SURGERY — ENDOSCOPIC RETROGRADE CHOLANGIOPANCREATOGRAPHY (ERCP) WITH PROPOFOL
Anesthesia: General

## 2023-02-09 MED ORDER — SODIUM CHLORIDE 0.9 % IV SOLN: INTRAVENOUS | Status: DC

## 2023-02-09 MED ORDER — DICLOFENAC SUPPOSITORY 100 MG
RECTAL | Status: AC
  Filled 2023-02-09: qty 1

## 2023-02-09 MED ORDER — FENTANYL CITRATE (PF) 100 MCG/2ML IJ SOLN
INTRAMUSCULAR | Status: DC | PRN
  Administered 2023-02-09: 100 ug via INTRAVENOUS

## 2023-02-09 MED ORDER — SODIUM CHLORIDE 0.9 % IV SOLN
INTRAVENOUS | Status: DC | PRN
  Administered 2023-02-09: 70 mL

## 2023-02-09 MED ORDER — GLUCAGON HCL RDNA (DIAGNOSTIC) 1 MG IJ SOLR
INTRAMUSCULAR | Status: AC
  Filled 2023-02-09: qty 1

## 2023-02-09 MED ORDER — DEXAMETHASONE SODIUM PHOSPHATE 10 MG/ML IJ SOLN
INTRAMUSCULAR | Status: DC | PRN
  Administered 2023-02-09: 10 mg via INTRAVENOUS

## 2023-02-09 MED ORDER — DICLOFENAC SUPPOSITORY 100 MG: 100.0000 mg | Freq: Once | RECTAL | Status: DC

## 2023-02-09 MED ORDER — ROCURONIUM BROMIDE 100 MG/10ML IV SOLN
INTRAVENOUS | Status: DC | PRN
  Administered 2023-02-09: 70 mg via INTRAVENOUS
  Administered 2023-02-09: 20 mg via INTRAVENOUS

## 2023-02-09 MED ORDER — LACTATED RINGERS IV SOLN: INTRAVENOUS | Status: DC | PRN

## 2023-02-09 MED ORDER — MIDAZOLAM HCL 2 MG/2ML IJ SOLN
INTRAMUSCULAR | Status: AC
  Filled 2023-02-09: qty 2

## 2023-02-09 MED ORDER — LIDOCAINE HCL (CARDIAC) PF 100 MG/5ML IV SOSY
PREFILLED_SYRINGE | INTRAVENOUS | Status: DC | PRN
  Administered 2023-02-09: 100 mg via INTRAVENOUS

## 2023-02-09 MED ORDER — ONDANSETRON HCL 4 MG/2ML IJ SOLN
INTRAMUSCULAR | Status: DC | PRN
  Administered 2023-02-09: 4 mg via INTRAVENOUS

## 2023-02-09 MED ORDER — SODIUM CHLORIDE 0.9 % IV SOLN
1.5000 g | Freq: Once | INTRAVENOUS | Status: AC
  Administered 2023-02-09: 1.5 g via INTRAVENOUS
  Filled 2023-02-09: qty 4

## 2023-02-09 MED ORDER — DICLOFENAC SUPPOSITORY 100 MG
RECTAL | Status: DC | PRN
  Administered 2023-02-09: 100 mg via RECTAL

## 2023-02-09 MED ORDER — MIDAZOLAM HCL 5 MG/5ML IJ SOLN
INTRAMUSCULAR | Status: DC | PRN
  Administered 2023-02-09: 2 mg via INTRAVENOUS

## 2023-02-09 MED ORDER — PROPOFOL 500 MG/50ML IV EMUL
INTRAVENOUS | Status: AC
  Filled 2023-02-09: qty 50

## 2023-02-09 MED ORDER — CELECOXIB 200 MG PO CAPS: 200.0000 mg | ORAL_CAPSULE | Freq: Two times a day (BID) | ORAL | Status: DC

## 2023-02-09 MED ORDER — PROPOFOL 10 MG/ML IV BOLUS
INTRAVENOUS | Status: DC | PRN
  Administered 2023-02-09: 300 mg via INTRAVENOUS

## 2023-02-09 MED ORDER — FENTANYL CITRATE (PF) 100 MCG/2ML IJ SOLN
INTRAMUSCULAR | Status: AC
  Filled 2023-02-09: qty 2

## 2023-02-09 NOTE — Transfer of Care (Signed)
Immediate Anesthesia Transfer of Care Note  Patient: Tonna Corner  Procedure(s) Performed: ENDOSCOPIC RETROGRADE CHOLANGIOPANCREATOGRAPHY (ERCP) WITH PROPOFOL STENT REMOVAL REMOVAL OF STONES STONE EXTRACTION WITH BASKET  Patient Location: PACU  Anesthesia Type:General  Level of Consciousness: awake  Airway & Oxygen Therapy: Patient Spontanous Breathing and Patient connected to face mask oxygen  Post-op Assessment: Report given to RN and Post -op Vital signs reviewed and stable  Post vital signs: Reviewed and stable  Last Vitals:  Vitals Value Taken Time  BP 164/98 02/09/23 1100  Temp 36.6   Pulse 70 02/09/23 1101  Resp 15 02/09/23 1101  SpO2 97 % 02/09/23 1101  Vitals shown include unfiled device data.  Last Pain:  Vitals:   02/09/23 0844  TempSrc: Temporal  PainSc: 4          Complications: No notable events documented.

## 2023-02-09 NOTE — Discharge Instructions (Addendum)
YOU HAD AN ENDOSCOPIC PROCEDURE TODAY: Refer to the procedure report and other information in the discharge instructions given to you for any specific questions about what was found during the examination. If this information does not answer your questions, please call [place] office at [phone] to clarify.   Bile duct stone removed - next step is to have gallbladder removed - contact Dr. Carolynne Edouard for appointment.  YOU SHOULD EXPECT: Some feelings of bloating in the abdomen. Passage of more gas than usual. Walking can help get rid of the air that was put into your GI tract during the procedure and reduce the bloating. If you had a lower endoscopy (such as a colonoscopy or flexible sigmoidoscopy) you may notice spotting of blood in your stool or on the toilet paper. Some abdominal soreness may be present for a day or two, also.  DIET:  Clear liquids until 2 PM then may try soup if doing ok then regular diet tomorrow if ok.  ACTIVITY: Your care partner should take you home directly after the procedure. You should plan to take it easy, moving slowly for the rest of the day. You can resume normal activity the day after the procedure however YOU SHOULD NOT DRIVE, use power tools, machinery or perform tasks that involve climbing or major physical exertion for 24 hours (because of the sedation medicines used during the test).   SYMPTOMS TO REPORT IMMEDIATELY: A gastroenterologist can be reached at any hour. Please call [phone]  for any of the following symptoms:  Following lower endoscopy (colonoscopy, flexible sigmoidoscopy) Excessive amounts of blood in the stool  Significant tenderness, worsening of abdominal pains  Swelling of the abdomen that is new, acute  Fever of 100 or higher  Following upper endoscopy (EGD, EUS, ERCP, esophageal dilation) Vomiting of blood or coffee ground material  New, significant abdominal pain  New, significant chest pain or pain under the shoulder blades  Painful  or persistently difficult swallowing  New shortness of breath  Black, tarry-looking or red, bloody stools  FOLLOW UP:  If any biopsies were taken you will be contacted by phone or by letter within the next 1-3 weeks. Call [phone]  if you have not heard about the biopsies in 3 weeks.  Please also call with any specific questions about appointments or follow up tests.YOU HAD AN ENDOSCOPIC PROCEDURE TODAY: Refer to the procedure report and other information in the discharge instructions given to you for any specific questions about what was found during the examination. If this information does not answer your questions, please call  office at [phone] to clarify.   YOU SHOULD EXPECT: Some feelings of bloating in the abdomen. Passage of more gas than usual. Walking can help get rid of the air that was put into your GI tract during the procedure and reduce the bloating. If you had a lower endoscopy (such as a colonoscopy or flexible sigmoidoscopy) you may notice spotting of blood in your stool or on the toilet paper. Some abdominal soreness may be present for a day or two, also.  DIET: Your first meal following the procedure should be a light meal and then it is ok to progress to your normal diet. A half-sandwich or bowl of soup is an example of a good first meal. Heavy or fried foods are harder to digest and may make you feel nauseous or bloated. Drink plenty of fluids but you should avoid alcoholic beverages for 24 hours. If you had a esophageal dilation, please see attached  instructions for diet.    ACTIVITY: Your care partner should take you home directly after the procedure. You should plan to take it easy, moving slowly for the rest of the day. You can resume normal activity the day after the procedure however YOU SHOULD NOT DRIVE, use power tools, machinery or perform tasks that involve climbing or major physical exertion for 24 hours (because of the sedation medicines used during the  test).   SYMPTOMS TO REPORT IMMEDIATELY: A gastroenterologist can be reached at any hour. Please call [phone]  for any of the following symptoms:  Following lower endoscopy (colonoscopy, flexible sigmoidoscopy) Excessive amounts of blood in the stool  Significant tenderness, worsening of abdominal pains  Swelling of the abdomen that is new, acute  Fever of 100 or higher  Following upper endoscopy (EGD, EUS, ERCP, esophageal dilation) Vomiting of blood or coffee ground material  New, significant abdominal pain  New, significant chest pain or pain under the shoulder blades  Painful or persistently difficult swallowing  New shortness of breath  Black, tarry-looking or red, bloody stools  FOLLOW UP:  If any biopsies were taken you will be contacted by phone or by letter within the next 1-3 weeks. Call [phone]  if you have not heard about the biopsies in 3 weeks.  Please also call with any specific questions about appointments or follow up tests.

## 2023-02-09 NOTE — H&P (Signed)
[facility] History and Physical   Primary Care Physician:  Sharlene Dory, DO   Reason for Procedure:   choledocholithiasis  Plan:    ERCP with stent and stone removal     HPI: Tony Walters is a 48 y.o. male with choledocholithiasis - had ERCP with sphincterotomy 11/2922, unable to extract stone so biliary stent placed. Here for follow-up exam and stent/stone removal. Also has cholelithiasis.  Lab Results  Component Value Date   ALT 64 (H) 01/02/2023   AST 33 01/02/2023   ALKPHOS 113 01/02/2023   BILITOT 0.5 01/02/2023     Past Medical History:  Diagnosis Date   Chicken pox as a child   Choledocholithiasis    Cholelithiasis    Fatty liver    Grief reaction 04/04/2013   HTN (hypertension) 04/04/2013   PTSD (post-traumatic stress disorder)    Unspecified hypothyroidism 04/07/2013    Past Surgical History:  Procedure Laterality Date   BILIARY DILATION  12/23/2022   Procedure: BILIARY DILATION;  Surgeon: Iva Boop, MD;  Location: [facility];  Service: Gastroenterology;;   BILIARY STENT PLACEMENT N/A 12/23/2022   Procedure: BILIARY STENT PLACEMENT;  Surgeon: Iva Boop, MD;  Location: [facility];  Service: Gastroenterology;  Laterality: N/A;   COLONOSCOPY  02/2020   ERCP N/A 12/23/2022   Procedure: ENDOSCOPIC RETROGRADE CHOLANGIOPANCREATOGRAPHY (ERCP);  Surgeon: Iva Boop, MD;  Location: [facility];  Service: Gastroenterology;  Laterality: N/A;   FOOT SURGERY     x4- plantar fibroma, screw in first metatarsal- both feet   HYDROCELE EXCISION / REPAIR  48 years old   PANCREATIC STENT PLACEMENT  12/23/2022   Procedure: PANCREATIC STENT PLACEMENT;  Surgeon: Iva Boop, MD;  Location: [facility];  Service: Gastroenterology;;   Dennison Mascot  12/23/2022   Procedure: Dennison Mascot;  Surgeon: Iva Boop, MD;  Location: [facility];  Service: Gastroenterology;;   WISDOM TOOTH EXTRACTION  2010    Prior to  Admission medications   Medication Sig Start Date End Date Taking? Authorizing Provider  celecoxib (CELEBREX) 200 MG capsule Take 1 capsule (200 mg total) by mouth 2 (two) times daily. 05/03/22  Yes Hyatt, Max T, DPM  docusate sodium (COLACE) 100 MG capsule Take 1 capsule (100 mg total) by mouth 2 (two) times daily. 12/25/22  Yes Rolly Salter, MD  DULoxetine (CYMBALTA) 60 MG capsule Take 1 capsule (60 mg total) by mouth daily. 11/29/22  Yes Sharlene Dory, DO  levothyroxine (SYNTHROID) 75 MCG tablet Take 1 tablet (75 mcg total) by mouth daily. 11/29/22  Yes Sharlene Dory, DO  olmesartan-hydrochlorothiazide (BENICAR HCT) 40-25 MG tablet Take 1 tablet by mouth daily. 11/29/22  Yes Sharlene Dory, DO  fenofibrate (TRICOR) 48 MG tablet Take 1 tablet (48 mg total) by mouth daily. 11/29/22   Sharlene Dory, DO  oxyCODONE (OXY IR/ROXICODONE) 5 MG immediate release tablet Take 1 tablet (5 mg total) by mouth every 4 (four) hours as needed for severe pain (pain score 7-10). 01/02/23   Sharlene Dory, DO  pravastatin (PRAVACHOL) 40 MG tablet Take 1 tablet (40 mg total) by mouth daily. 11/29/22   Sharlene Dory, DO  sildenafil (VIAGRA) 100 MG tablet Take 1/2-1 tablets (50-100 mg total) by mouth daily as needed for erectile dysfunction. 11/30/21   Wendling, Jilda Roche, DO  tiZANidine (ZANAFLEX) 4 MG tablet Take 1 tablet (4 mg total) by mouth every 6 (six) hours as needed for muscle spasms. 11/29/22   Arva Chafe  Paul, DO  hydrochlorothiazide (HYDRODIURIL) 25 MG tablet Take 1 tablet (25 mg total) by mouth daily. 08/21/19 04/20/20  Sharlene Dory, DO    Current Facility-Administered Medications  Medication Dose Route Frequency Provider Last Rate Last Admin   0.9 %  sodium chloride infusion   Intravenous Continuous Iva Boop, MD       ampicillin-sulbactam (UNASYN) 1.5 g in sodium chloride 0.9 % 100 mL IVPB  1.5 g Intravenous Once Iva Boop, MD       diclofenac suppository 100 mg  100 mg Rectal Once Iva Boop, MD        Allergies as of 12/29/2022   (No Known Allergies)    Family History  Problem Relation Age of Onset   Congestive Heart Failure Father    Hyperlipidemia Father    Hypertension Father    Kidney disease Maternal Grandmother    Diabetes Maternal Grandmother        type 2   Stroke Maternal Grandmother    Alcohol abuse Paternal Grandmother    Pneumonia Paternal Grandfather    Asthma Brother    Breast cancer Neg Hx    Colon cancer Neg Hx    Esophageal cancer Neg Hx    Stomach cancer Neg Hx    Rectal cancer Neg Hx     Social History   Socioeconomic History   Marital status: Married    Spouse name: Not on file   Number of children: Not on file   Years of education: Not on file   Highest education level: Not on file  Occupational History   Not on file  Tobacco Use   Smoking status: Every Day    Current packs/day: 0.50    Average packs/day: 0.5 packs/day for 25.0 years (12.5 ttl pk-yrs)    Types: Cigarettes   Smokeless tobacco: Never   Tobacco comments:    quit in Nov 2014 but started back 03-18-13  Vaping Use   Vaping status: Never Used  Substance and Sexual Activity   Alcohol use: Yes    Comment: occasionally   Drug use: No   Sexual activity: Not Currently    Partners: Female  Other Topics Concern   Not on file  Social History Narrative   Patient is married he has 3 children   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[employer]   He is a cigarette smoker, he has a history of heavy alcohol use and still drinks "occasionally" no drug use   Social Drivers of Corporate investment banker Strain: Not on file  Food Insecurity: No Food Insecurity (12/24/2022)   Hunger Vital Sign    Worried About Running Out of Food in the Last Year: Never true    Ran Out of Food in the Last Year: Never true  Transportation Needs: No Transportation Needs (12/24/2022)   PRAPARE - Administrator, Civil Service  (Medical): No    Lack of Transportation (Non-Medical): No  Physical Activity: Not on file  Stress: Not on file  Social Connections: Not on file  Intimate Partner Violence: Not At Risk (12/24/2022)   Humiliation, Afraid, Rape, and Kick questionnaire    Fear of Current or Ex-Partner: No    Emotionally Abused: No    Physically Abused: No    Sexually Abused: No    Review of Systems:  All other review of systems negative except as mentioned in the HPI.  Physical Exam: Vital signs BP (!) 146/99   Pulse 67  Temp 97.8 F (36.6 C) (Temporal)   Resp 13   Ht 6\' 2"  (1.88 m)   Wt 117.9 kg   SpO2 99%   BMI 33.38 kg/m   General:   Alert,  Well-developed, well-nourished, pleasant and cooperative in NAD Lungs:  Clear throughout to auscultation.   Heart:  Regular rate and rhythm; no murmurs, clicks, rubs,  or gallops. Abdomen:  Soft, nontender and nondistended. Normal bowel sounds.   Neuro/Psych:  Alert and cooperative. Normal mood and affect. A and O x 3   @Sevana Grandinetti  Sena Slate, MD, [facility] [phone] (pager) 02/09/2023 9:33 AM@

## 2023-02-09 NOTE — Anesthesia Postprocedure Evaluation (Signed)
Anesthesia Post Note  Patient: Dealer  Procedure(s) Performed: ENDOSCOPIC RETROGRADE CHOLANGIOPANCREATOGRAPHY (ERCP) WITH PROPOFOL STENT REMOVAL REMOVAL OF STONES STONE EXTRACTION WITH BASKET     Patient location during evaluation: Endoscopy Anesthesia Type: General Level of consciousness: awake and alert Pain management: pain level controlled Vital Signs Assessment: post-procedure vital signs reviewed and stable Respiratory status: spontaneous breathing, nonlabored ventilation, respiratory function stable and patient connected to nasal cannula oxygen Cardiovascular status: blood pressure returned to baseline and stable Postop Assessment: no apparent nausea or vomiting Anesthetic complications: no   No notable events documented.  Last Vitals:  Vitals:   02/09/23 1130 02/09/23 1140  BP: (!) 130/93 (!) 143/71  Pulse: 64 63  Resp: (!) 9 (!) 8  Temp:    SpO2: 97% 94%    Last Pain:  Vitals:   02/09/23 1130  TempSrc:   PainSc: 0-No pain                 Trevor Iha

## 2023-02-09 NOTE — Addendum Note (Signed)
Addendum  created 02/09/23 1241 by Carloyn Manner, CRNA   Flowsheet accepted, Intraprocedure Flowsheets edited

## 2023-02-09 NOTE — Anesthesia Procedure Notes (Signed)
Procedure Name: Intubation Date/Time: 02/09/2023 9:46 AM  Performed by: Carloyn Manner, CRNAPre-anesthesia Checklist: Patient identified, Emergency Drugs available, Suction available, Patient being monitored and Timeout performed Patient Re-evaluated:Patient Re-evaluated prior to induction Oxygen Delivery Method: Circle system utilized Preoxygenation: Pre-oxygenation with 100% oxygen Induction Type: IV induction Ventilation: Mask ventilation without difficulty Laryngoscope Size: 3, 4 and Glidescope Grade View: Grade II Tube type: Oral Tube size: 7.5 mm Number of attempts: 3 Airway Equipment and Method: Stylet and Video-laryngoscopy Placement Confirmation: ETT inserted through vocal cords under direct vision Secured at: 26 cm Tube secured with: Tape Dental Injury: Teeth and Oropharynx as per pre-operative assessment  Difficulty Due To: Difficult Airway- due to large tongue Comments: Easy to Vent without AW, Very deep-taped at 26 cm.

## 2023-02-09 NOTE — Op Note (Signed)
[facility] Patient Name: Tony Walters Procedure Date: 02/09/2023 MRN: [id number] Attending MD: Iva Boop , MD, [id number] Date of Birth: [birth year] CSN: [id number] Age: 48 Admit Type: Outpatient Procedure:                ERCP with biliary stent removal, bile duct stone                            lithotripsy and removal Indications:              Bile duct stone(s) Providers:                Iva Boop, MD, Norman Clay, RN, Rozetta Nunnery, Technician Referring MD:              Medicines:                General Anesthesia, Unasyn 1.5 g IV, diclofenac 100                            mg per rectum Complications:            No immediate complications. Estimated Blood Loss:     Estimated blood loss was minimal. Procedure:                Pre-Anesthesia Assessment:                           - Prior to the procedure, a History and Physical                            was performed, and patient medications and                            allergies were reviewed. The patient's tolerance of                            previous anesthesia was also reviewed. The risks                            and benefits of the procedure and the sedation                            options and risks were discussed with the patient.                            All questions were answered, and informed consent                            was obtained. Prior Anticoagulants: The patient has                            taken no anticoagulant or antiplatelet agents. ASA  Grade Assessment: II - A patient with mild systemic                            disease. After reviewing the risks and benefits,                            the patient was deemed in satisfactory condition to                            undergo the procedure.                           After obtaining informed consent, the scope was                            passed under direct vision.  Throughout the                            procedure, the patient's blood pressure, pulse, and                            oxygen saturations were monitored continuously. The                            [id number]-Q190V ([id number] Olympus duodenoscope was                            introduced through the mouth, and used to inject                            contrast into and used to inject contrast into the                            bile duct. The ERCP was somewhat difficult due to                            large stone. The patient tolerated the procedure                            well. Scope In: Scope Out: Findings:      A biliary stent was visible on the scout film. The esophagus was       successfully intubated under direct vision and was not seen well with       side-view scope. The scope was advanced to a major papilla with bile       duct stent in place + s/p biliary sphincterotomy in the descending       duodenum without detailed examination of the pharynx, larynx and       associated structures, and upper GI tract. The remainder of the upper GI       tract was grossly normal except for a small diverticulum adjacent to the       papilla.      The stent was removed using a snare. Bile duct then cannulated with       Hydrotome and  the 035 wire was passed as well. Hydrotome exchanged for       9-12 mm extraction balloon for better visualization. Contrast was       injected revealing dilated biliary tree maximum about 18 mm proximally       and there was stone as on previous ERCP (12/24) about 12x 16 mm size.       Attempts to remove with balloon were unsuccessful.I was unable to engage       with 2.5 cm extraction/lithotripsy basket. I switched to 3 cm basket,       Dr. Meridee Score came in to the case and was able to engage the stone and       fracture it. I then used the 9-12 mm extraction balloon to remove       multiple pieces of calcified stone and an occlusion cholangiogram was        negative at end of case. the pancreas was not entered by intent. Cystic       duct filled but gallbladder did not fill. I personally interpreted the       images. Impression:               - Choledocholithiasis was found. Complete removal                            was accomplished by balloon extraction after                            mechanical lithotripsy with basket.                           - Diffusely dilated biliary tree thought related to                            choledocholithisais causing obstruction                           - Biliary stent removed                           - s/p prior biliary sphincterotomy                           - Periampullary diverticulum Moderate Sedation:      Not Applicable - Patient had care per Anesthesia. Recommendation:           - Clewar liquids today then normal diet tomorrow if                            ok                           Laparoscopic cholecystectomy per [facility] (Dr. Carolynne Edouard) Procedure Code(s):        --- Professional ---                           [phone], Endoscopic retrograde                            cholangiopancreatography (ERCP); with destruction  of calculi, any method (eg, mechanical,                            electrohydraulic, lithotripsy)                           43275, Endoscopic retrograde                            cholangiopancreatography (ERCP); with removal of                            foreign body(s) or stent(s) from biliary/pancreatic                            duct(s)                           52841, Endoscopic catheterization of the biliary                            ductal system, radiological supervision and                            interpretation Diagnosis Code(s):        --- Professional ---                           K80.50, Calculus of bile duct without cholangitis                            or cholecystitis without obstruction CPT copyright 2022 American Medical Association. All  rights reserved. The codes documented in this report are preliminary and upon coder review may  be revised to meet current compliance requirements. Iva Boop, MD 02/09/2023 11:14:46 AM This report has been signed electronically. Number of Addenda: 0

## 2023-02-12 ENCOUNTER — Encounter (HOSPITAL_COMMUNITY): Payer: Self-pay | Admitting: Internal Medicine

## 2023-02-14 ENCOUNTER — Telehealth: Payer: Self-pay | Admitting: Internal Medicine

## 2023-02-14 NOTE — Telephone Encounter (Signed)
Patient called and stated that he was feeling a little bit better from the procedure but he is having pain in his mid section in the lower abdominal. Patient also stated that he is having a dry cough and does not know how he feels about it. Patient is requesting a call back. Please advise.

## 2023-02-15 ENCOUNTER — Ambulatory Visit: Payer: Self-pay | Admitting: General Surgery

## 2023-02-15 ENCOUNTER — Telehealth: Payer: Self-pay

## 2023-02-15 DIAGNOSIS — K8050 Calculus of bile duct without cholangitis or cholecystitis without obstruction: Secondary | ICD-10-CM

## 2023-02-15 NOTE — Telephone Encounter (Signed)
Follow up on Friday at office visit. Dr. Carmelia Roller would do CBC,CMET, lipase please   These symptoms do not sound like a post-ERCP complication (at least not typical)   Sxs sound more like possible viral syndrome, mild gastroenterits?? But await your evaluation   I had told him to call Dr. Carolynne Edouard for appointment and also messaged Dr. Carolynne Edouard that ERCP was complete and ok for lap chole

## 2023-02-15 NOTE — Telephone Encounter (Signed)
Pt stated that he will not be able to come in today due to the fact that he is at work and tomorrow there is no availability.  Pt was notified to keep the appointment on Friday with his PCP.  Name of Surgeon provided to pt to contact. Pt verbalized understanding with all questions answered.

## 2023-02-15 NOTE — Telephone Encounter (Signed)
Pt was contacted in regard to previous message.  Pt stated that he has been having frequent abdominal cramping along with left lower abdominal discomfort since the ERCP om 02/09/2023.Pt stated that he had a normal BM this AM but he has been feeling nauseated today. Pt stated that he is hearing lots of noises from his abdomen and feeling that something is moving around. Pt states that he feels something is not right. Pt denies cough or fever.  Pt also questioned about having surgery to have his gallbladder removed. Please review and advise

## 2023-02-15 NOTE — Telephone Encounter (Signed)
Left message for pt to call back  °

## 2023-02-15 NOTE — Telephone Encounter (Signed)
OK -   Dr. Carmelia Roller would do CBC,CMET, lipase please  These symptoms do not sound like a post-ERCP complication (at least not typical)  Sxs sound more like possible viral syndrome, mild gastroenterits?? But await your evaluation  I had told him to call Dr. Carolynne Edouard for appointment and also messaged Dr. Carolynne Edouard that ERCP was complete and ok for lap chole  Thank you  CEG

## 2023-02-15 NOTE — Telephone Encounter (Signed)
See if we could add him on for today or tomorrow.  Regarding surgery I had messaged his surgeon and also asked the patient to call his surgeon.  I can explain more at a visit.  If he is unable to come in he does have a PCP appointment on Friday and could be assessed there.  Let me know if he is not able to be seen by [place].

## 2023-02-17 ENCOUNTER — Other Ambulatory Visit (HOSPITAL_BASED_OUTPATIENT_CLINIC_OR_DEPARTMENT_OTHER): Payer: Self-pay

## 2023-02-17 ENCOUNTER — Encounter: Payer: Self-pay | Admitting: Family Medicine

## 2023-02-17 ENCOUNTER — Ambulatory Visit (INDEPENDENT_AMBULATORY_CARE_PROVIDER_SITE_OTHER): Payer: BC Managed Care – PPO | Admitting: Family Medicine

## 2023-02-17 VITALS — BP 144/86 | HR 77 | Temp 98.0°F | Resp 16 | Ht 74.0 in | Wt 260.0 lb

## 2023-02-17 DIAGNOSIS — R1032 Left lower quadrant pain: Secondary | ICD-10-CM

## 2023-02-17 DIAGNOSIS — R110 Nausea: Secondary | ICD-10-CM | POA: Diagnosis not present

## 2023-02-17 DIAGNOSIS — R0681 Apnea, not elsewhere classified: Secondary | ICD-10-CM | POA: Diagnosis not present

## 2023-02-17 LAB — COMPREHENSIVE METABOLIC PANEL
ALT: 29 U/L (ref 0–53)
AST: 24 U/L (ref 0–37)
Albumin: 4.6 g/dL (ref 3.5–5.2)
Alkaline Phosphatase: 65 U/L (ref 39–117)
BUN: 11 mg/dL (ref 6–23)
CO2: 28 meq/L (ref 19–32)
Calcium: 9.4 mg/dL (ref 8.4–10.5)
Chloride: 104 meq/L (ref 96–112)
Creatinine, Ser: 0.95 mg/dL (ref 0.40–1.50)
GFR: 94.14 mL/min (ref >60.00)
Glucose, Bld: 85 mg/dL (ref 70–99)
Potassium: 4.4 meq/L (ref 3.5–5.1)
Sodium: 141 meq/L (ref 135–145)
Total Bilirubin: 0.5 mg/dL (ref 0.2–1.2)
Total Protein: 7.3 g/dL (ref 6.0–8.3)

## 2023-02-17 LAB — LIPASE: Lipase: 37 U/L (ref 11.0–59.0)

## 2023-02-17 LAB — CBC
HCT: 41.6 % (ref 39.0–52.0)
Hemoglobin: 13.6 g/dL (ref 13.0–17.0)
MCHC: 32.7 g/dL (ref 30.0–36.0)
MCV: 85.2 fL (ref 78.0–100.0)
Platelets: 229 10*3/uL (ref 150.0–400.0)
RBC: 4.89 Mil/uL (ref 4.22–5.81)
RDW: 15.5 % (ref 11.5–15.5)
WBC: 8.9 10*3/uL (ref 4.0–10.5)

## 2023-02-17 MED ORDER — ONDANSETRON 4 MG PO TBDP
4.0000 mg | ORAL_TABLET | Freq: Three times a day (TID) | ORAL | 0 refills | Status: DC | PRN
  Filled 2023-02-17: qty 9, 3d supply, fill #0

## 2023-02-17 MED ORDER — DICYCLOMINE HCL 10 MG PO CAPS
10.0000 mg | ORAL_CAPSULE | Freq: Four times a day (QID) | ORAL | 0 refills | Status: DC | PRN
  Filled 2023-02-17: qty 60, 15d supply, fill #0

## 2023-02-17 NOTE — Patient Instructions (Addendum)
Give [place] 2-3 business days to get the results of your labs back.   Take Metamucil or Benefiber daily.  Try 2 tablespoons of milk of mag in 4 oz of warm prune juice. Do that and wait a couple hours. If no improvement, try a Dulcolax suppository and then let me know if we are still having issues.   Stay hydrated.  If you do not hear anything about your referral in the next 1-2 weeks, call our office and ask for an update.  Let us know if you need anything.

## 2023-02-17 NOTE — Progress Notes (Signed)
Chief Complaint  Patient presents with   Hospitalization Follow-up    Follow up      Subjective Tony Walters is a 48 y.o. male who presents with abd pain and nausea.  Symptoms began 2 months ago.  Patient has right upper quadrant abdominal pain, left lower quadrant cramping, and nausea Patient denies vomiting, diarrhea, fever, and URI symptoms Treatment to date: Oxycodone helped initially. Recently had ERCP done.  GI team does not think his symptoms are related to that.  He was observed to stop breathing while under anesthesia well. He was given contact information for surgeon to remove his gallbladder.  Past Medical History:  Diagnosis Date   Chicken pox as a child   Choledocholithiasis    Cholelithiasis    Fatty liver    Grief reaction 04/04/2013   HTN (hypertension) 04/04/2013   PTSD (post-traumatic stress disorder)    Unspecified hypothyroidism 04/07/2013    Exam BP (!) 144/86   Pulse 77   Temp 98 F (36.7 C) (Oral)   Resp 16   Ht 6\' 2"  (1.88 m)   Wt 260 lb (117.9 kg)   SpO2 97%   BMI 33.38 kg/m  General:  well developed, well hydrated, in no apparent distress Throat/Pharynx:  lips and gingiva without lesion; tongue and uvula midline; non-inflamed pharynx; no exudates or postnasal drainage Lungs:  clear to auscultation, breath sounds equal bilaterally, no respiratory distress, no wheezes Cardio:  RRR Abdomen:  abdomen soft, TTP in the left lower quadrant, right upper quadrant, and epigastric region; bowel sounds normal; no masses or organomegaly Psych: Appropriate judgement/insight  Assessment and Plan  Nausea - Plan: CBC, Comprehensive metabolic panel, Lipase, ondansetron (ZOFRAN-ODT) 4 MG disintegrating tablet  Left lower quadrant abdominal pain - Plan: dicyclomine (BENTYL) 10 MG capsule  Witnessed apneic spells - Plan: Ambulatory referral to Pulmonology  His GI physician, Dr. Leone Payor, reached out to me recommending above labs.  Zofran for nausea.   Bentyl for cramping.  He may have a component of constipation/gas.  Needs increase hydration.  Fiber supplement recommended.  Combination of milk of magnesia and prune juice recommended for more immediate results. While he was under anesthesia for the ERCP, the medical team noticed he stopped breathing.  He was referred to the pulmonology team a few years ago for sleep study but it never happened.  We will refer again to place near [facility] for the sake of convenience. The patient voiced understanding and agreement to the plan.  I spent 33 minutes with the patient discussing the above plans in addition to reviewing his chart on the same day of the visit.  Jilda Roche [place], DO 02/17/23  10:47 AM

## 2023-02-23 NOTE — Pre-Procedure Instructions (Signed)
Surgical Instructions   Your procedure is scheduled on Thursday, March 02, 2023. Report to [facility] Main Entrance "A" at 9:30 A.M., then check in with the Admitting office. Any questions or running late day of surgery: call [phone]  Questions prior to your surgery date: call [phone], Monday-Friday, 8am-4pm. If you experience any cold or flu symptoms such as cough, fever, chills, shortness of breath, etc. between now and your scheduled surgery, please notify us at the above number.     Remember:  Do not eat after midnight the night before your surgery   You may drink clear liquids until 8:30am the morning of your surgery.   Clear liquids allowed are: Water, Non-Citrus Juices (without pulp), Carbonated Beverages, Clear Tea (no milk, honey, etc.), Black Coffee Only (NO MILK, CREAM OR POWDERED CREAMER of any kind), and Gatorade.    Take these medicines the morning of surgery with A SIP OF WATER : Duloxetine (Cymbalta) Levothyroxine (Synthroid) Pravastatin (Pravachol)  May take these medicines IF NEEDED: Ondansetron (Zofran) Dicyclomine (Bentyl)   One week prior to surgery, STOP taking any Aspirin (unless otherwise instructed by your surgeon) Aleve, Naproxen, Ibuprofen, Motrin, Advil, Goody's, BC's, all herbal medications, fish oil, and non-prescription vitamins.                     Do NOT Smoke (Tobacco/Vaping) for 24 hours prior to your procedure.  If you use a CPAP at night, you may bring your mask/headgear for your overnight stay.   You will be asked to remove any contacts, glasses, piercing's, hearing aid's, dentures/partials prior to surgery. Please bring cases for these items if needed.    Patients discharged the day of surgery will not be allowed to drive home, and someone needs to stay with them for 24 hours.  SURGICAL WAITING ROOM VISITATION Patients may have no more than 2 support people in the waiting area - these visitors may rotate.   Pre-op nurse will  coordinate an appropriate time for 1 ADULT support person, who may not rotate, to accompany patient in pre-op.  Children under the age of 47 must have an adult with them who is not the patient and must remain in the main waiting area with an adult.  If the patient needs to stay at the hospital during part of their recovery, the visitor guidelines for inpatient rooms apply.  Please refer to the [facility] website for the visitor guidelines for any additional information.   If you received a COVID test during your pre-op visit  it is requested that you wear a mask when out in public, stay away from anyone that may not be feeling well and notify your surgeon if you develop symptoms. If you have been in contact with anyone that has tested positive in the last 10 days please notify you surgeon.      Pre-operative CHG Bathing Instructions   You can play a key role in reducing the risk of infection after surgery. Your skin needs to be as free of germs as possible. You can reduce the number of germs on your skin by washing with CHG (chlorhexidine gluconate) soap before surgery. CHG is an antiseptic soap that kills germs and continues to kill germs even after washing.   DO NOT use if you have an allergy to chlorhexidine/CHG or antibacterial soaps. If your skin becomes reddened or irritated, stop using the CHG and notify one of our RNs at [phone].  TAKE A SHOWER THE NIGHT BEFORE SURGERY AND THE DAY OF SURGERY    Please keep in mind the following:  DO NOT shave, including legs and underarms, 48 hours prior to surgery.   You may shave your face before/day of surgery.  Place clean sheets on your bed the night before surgery Use a clean washcloth (not used since being washed) for each shower. DO NOT sleep with pet's night before surgery.  CHG Shower Instructions:  Wash your face and private area with normal soap. If you choose to wash your hair, wash first with your normal shampoo.   After you use shampoo/soap, rinse your hair and body thoroughly to remove shampoo/soap residue.  Turn the water OFF and apply half the bottle of CHG soap to a CLEAN washcloth.  Apply CHG soap ONLY FROM YOUR NECK DOWN TO YOUR TOES (washing for 3-5 minutes)  DO NOT use CHG soap on face, private areas, open wounds, or sores.  Pay special attention to the area where your surgery is being performed.  If you are having back surgery, having someone wash your back for you may be helpful. Wait 2 minutes after CHG soap is applied, then you may rinse off the CHG soap.  Pat dry with a clean towel  Put on clean pajamas    Additional instructions for the day of surgery: DO NOT APPLY any lotions, deodorants, cologne, or perfumes.   Do not wear jewelry or makeup Do not wear nail polish, gel polish, artificial nails, or any other type of covering on natural nails (fingers and toes) Do not bring valuables to the hospital. [facility] is not responsible for valuables/personal belongings. Put on clean/comfortable clothes.  Please brush your teeth.  Ask your nurse before applying any prescription medications to the skin.

## 2023-02-24 ENCOUNTER — Encounter (HOSPITAL_COMMUNITY): Payer: Self-pay

## 2023-02-24 ENCOUNTER — Encounter (HOSPITAL_COMMUNITY)
Admission: RE | Admit: 2023-02-24 | Discharge: 2023-02-24 | Disposition: A | Discharge status: Home / Self Care | Payer: BC Managed Care – PPO | Source: Ambulatory Visit | Attending: General Surgery | Admitting: General Surgery

## 2023-02-24 ENCOUNTER — Other Ambulatory Visit: Payer: Self-pay

## 2023-02-24 DIAGNOSIS — Z01818 Encounter for other preprocedural examination: Secondary | ICD-10-CM | POA: Diagnosis present

## 2023-02-24 NOTE — Progress Notes (Signed)
PCP - Dr. Arva Chafe Cardiologist - Denies  PPM/ICD - Denies Device Orders - n/a Rep Notified - n/a  Chest x-ray - 12-16-22 - N/A EKG - 12-21-22  Stress Test - Denies ECHO - Denies Cardiac Cath - Denies  Sleep Study - Denies CPAP - n/a  NON-diabetic  Last dose of GLP1 agonist-  Denies GLP1 instructions: n/a  Blood Thinner Instructions: Denies Aspirin Instructions: Denies  ERAS Protcol - ERAS till 8:30 PRE-SURGERY Ensure or G2- none  COVID TEST- No   Anesthesia review: Y, HTN, patient had an ERCP on 02/09/23 and states he stopped breathing for a moment. Patient states they believe he might have sleep apnea and he is planning on speaking with primary about sleep study.   Patient denies shortness of breath, fever, cough and chest pain at PAT appointment. Patient denies any respiratory issues at this time.    All instructions explained to the patient, with a verbal understanding of the material. Patient agrees to go over the instructions while at home for a better understanding. Patient also instructed to self quarantine after being tested for COVID-19. The opportunity to ask questions was provided.

## 2023-02-28 ENCOUNTER — Other Ambulatory Visit (HOSPITAL_BASED_OUTPATIENT_CLINIC_OR_DEPARTMENT_OTHER): Payer: Self-pay

## 2023-03-02 ENCOUNTER — Ambulatory Visit (HOSPITAL_COMMUNITY): Payer: Self-pay | Admitting: Physician Assistant

## 2023-03-02 ENCOUNTER — Ambulatory Visit (HOSPITAL_COMMUNITY): Payer: BC Managed Care – PPO

## 2023-03-02 ENCOUNTER — Other Ambulatory Visit: Payer: Self-pay

## 2023-03-02 ENCOUNTER — Encounter (HOSPITAL_COMMUNITY)
Admission: AD | Disposition: A | Discharge status: Home / Self Care | Payer: Self-pay | Source: Home / Self Care | Attending: General Surgery

## 2023-03-02 ENCOUNTER — Inpatient Hospital Stay (HOSPITAL_COMMUNITY)
Admission: AD | Admit: 2023-03-02 | Discharge: 2023-03-08 | DRG: 418 | Disposition: A | Discharge status: Home / Self Care | Payer: BC Managed Care – PPO | Attending: General Surgery | Admitting: General Surgery

## 2023-03-02 ENCOUNTER — Encounter (HOSPITAL_COMMUNITY): Payer: Self-pay | Admitting: General Surgery

## 2023-03-02 DIAGNOSIS — K760 Fatty (change of) liver, not elsewhere classified: Secondary | ICD-10-CM | POA: Diagnosis present

## 2023-03-02 DIAGNOSIS — Z823 Family history of stroke: Secondary | ICD-10-CM | POA: Diagnosis not present

## 2023-03-02 DIAGNOSIS — K9189 Other postprocedural complications and disorders of digestive system: Secondary | ICD-10-CM | POA: Diagnosis not present

## 2023-03-02 DIAGNOSIS — F4310 Post-traumatic stress disorder, unspecified: Secondary | ICD-10-CM | POA: Diagnosis present

## 2023-03-02 DIAGNOSIS — F17210 Nicotine dependence, cigarettes, uncomplicated: Secondary | ICD-10-CM | POA: Diagnosis present

## 2023-03-02 DIAGNOSIS — K838 Other specified diseases of biliary tract: Secondary | ICD-10-CM | POA: Diagnosis not present

## 2023-03-02 DIAGNOSIS — Z833 Family history of diabetes mellitus: Secondary | ICD-10-CM

## 2023-03-02 DIAGNOSIS — Z79890 Hormone replacement therapy (postmenopausal): Secondary | ICD-10-CM | POA: Diagnosis not present

## 2023-03-02 DIAGNOSIS — Z8249 Family history of ischemic heart disease and other diseases of the circulatory system: Secondary | ICD-10-CM

## 2023-03-02 DIAGNOSIS — Z83438 Family history of other disorder of lipoprotein metabolism and other lipidemia: Secondary | ICD-10-CM | POA: Diagnosis not present

## 2023-03-02 DIAGNOSIS — Y836 Removal of other organ (partial) (total) as the cause of abnormal reaction of the patient, or of later complication, without mention of misadventure at the time of the procedure: Secondary | ICD-10-CM | POA: Diagnosis not present

## 2023-03-02 DIAGNOSIS — Z825 Family history of asthma and other chronic lower respiratory diseases: Secondary | ICD-10-CM

## 2023-03-02 DIAGNOSIS — K8010 Calculus of gallbladder with chronic cholecystitis without obstruction: Principal | ICD-10-CM | POA: Diagnosis present

## 2023-03-02 DIAGNOSIS — K8050 Calculus of bile duct without cholangitis or cholecystitis without obstruction: Secondary | ICD-10-CM

## 2023-03-02 DIAGNOSIS — I10 Essential (primary) hypertension: Secondary | ICD-10-CM | POA: Diagnosis present

## 2023-03-02 DIAGNOSIS — Z9049 Acquired absence of other specified parts of digestive tract: Secondary | ICD-10-CM | POA: Diagnosis not present

## 2023-03-02 DIAGNOSIS — K8064 Calculus of gallbladder and bile duct with chronic cholecystitis without obstruction: Principal | ICD-10-CM | POA: Diagnosis present

## 2023-03-02 DIAGNOSIS — D72829 Elevated white blood cell count, unspecified: Secondary | ICD-10-CM | POA: Diagnosis not present

## 2023-03-02 DIAGNOSIS — E785 Hyperlipidemia, unspecified: Secondary | ICD-10-CM | POA: Diagnosis present

## 2023-03-02 DIAGNOSIS — E039 Hypothyroidism, unspecified: Secondary | ICD-10-CM | POA: Diagnosis present

## 2023-03-02 HISTORY — PX: CHOLECYSTECTOMY: SHX55

## 2023-03-02 SURGERY — LAPAROSCOPIC CHOLECYSTECTOMY
Anesthesia: General | Site: Abdomen

## 2023-03-02 MED ORDER — PHENYLEPHRINE 80 MCG/ML (10ML) SYRINGE FOR IV PUSH (FOR BLOOD PRESSURE SUPPORT)
PREFILLED_SYRINGE | INTRAVENOUS | Status: DC | PRN
  Administered 2023-03-02: 120 ug via INTRAVENOUS
  Administered 2023-03-02: 80 ug via INTRAVENOUS

## 2023-03-02 MED ORDER — OXYCODONE HCL 5 MG/5ML PO SOLN: 5.0000 mg | Freq: Once | ORAL | Status: DC | PRN

## 2023-03-02 MED ORDER — PRAVASTATIN SODIUM 40 MG PO TABS
40.0000 mg | ORAL_TABLET | Freq: Every day | ORAL | Status: DC
  Administered 2023-03-03 – 2023-03-08 (×6): 40 mg via ORAL
  Filled 2023-03-02 (×6): qty 1

## 2023-03-02 MED ORDER — BUPIVACAINE-EPINEPHRINE (PF) 0.25% -1:200000 IJ SOLN
INTRAMUSCULAR | Status: AC
  Filled 2023-03-02: qty 30

## 2023-03-02 MED ORDER — MORPHINE SULFATE (PF) 2 MG/ML IV SOLN
1.0000 mg | INTRAVENOUS | Status: DC | PRN
  Administered 2023-03-02 – 2023-03-04 (×7): 2 mg via INTRAVENOUS
  Filled 2023-03-02 (×8): qty 1

## 2023-03-02 MED ORDER — BUPIVACAINE-EPINEPHRINE 0.25% -1:200000 IJ SOLN
INTRAMUSCULAR | Status: DC | PRN
  Administered 2023-03-02: 20 mL

## 2023-03-02 MED ORDER — OXYCODONE HCL 5 MG PO TABS: 5.0000 mg | ORAL_TABLET | Freq: Once | ORAL | Status: DC | PRN

## 2023-03-02 MED ORDER — GABAPENTIN 100 MG PO CAPS
100.0000 mg | ORAL_CAPSULE | ORAL | Status: AC
  Administered 2023-03-02: 100 mg via ORAL
  Filled 2023-03-02: qty 1

## 2023-03-02 MED ORDER — SODIUM CHLORIDE 0.9% FLUSH: 3.0000 mL | INTRAVENOUS | Status: DC | PRN

## 2023-03-02 MED ORDER — DROPERIDOL 2.5 MG/ML IJ SOLN: 0.6250 mg | Freq: Once | INTRAMUSCULAR | Status: DC | PRN

## 2023-03-02 MED ORDER — FENTANYL CITRATE (PF) 250 MCG/5ML IJ SOLN
INTRAMUSCULAR | Status: DC | PRN
  Administered 2023-03-02: 150 ug via INTRAVENOUS
  Administered 2023-03-02: 50 ug via INTRAVENOUS

## 2023-03-02 MED ORDER — PANTOPRAZOLE SODIUM 40 MG IV SOLR
40.0000 mg | Freq: Every day | INTRAVENOUS | Status: DC
  Administered 2023-03-02 – 2023-03-05 (×4): 40 mg via INTRAVENOUS
  Filled 2023-03-02 (×4): qty 10

## 2023-03-02 MED ORDER — DEXMEDETOMIDINE HCL IN NACL 200 MCG/50ML IV SOLN
INTRAVENOUS | Status: DC | PRN
  Administered 2023-03-02: 8 ug via INTRAVENOUS
  Administered 2023-03-02 (×2): 16 ug via INTRAVENOUS

## 2023-03-02 MED ORDER — SODIUM CHLORIDE 0.9% FLUSH
3.0000 mL | Freq: Two times a day (BID) | INTRAVENOUS | Status: DC
  Administered 2023-03-03 (×2): 10 mL via INTRAVENOUS
  Administered 2023-03-04 – 2023-03-05 (×2): 5 mL via INTRAVENOUS
  Administered 2023-03-06 (×2): 10 mL via INTRAVENOUS
  Administered 2023-03-07 (×2): 5 mL via INTRAVENOUS
  Administered 2023-03-08: 10 mL via INTRAVENOUS

## 2023-03-02 MED ORDER — ROCURONIUM BROMIDE 10 MG/ML (PF) SYRINGE
PREFILLED_SYRINGE | INTRAVENOUS | Status: DC | PRN
  Administered 2023-03-02: 70 mg via INTRAVENOUS

## 2023-03-02 MED ORDER — OLMESARTAN MEDOXOMIL-HCTZ 40-25 MG PO TABS: 1.0000 | ORAL_TABLET | Freq: Every day | ORAL | Status: DC

## 2023-03-02 MED ORDER — PROPOFOL 10 MG/ML IV BOLUS
INTRAVENOUS | Status: AC
  Filled 2023-03-02: qty 20

## 2023-03-02 MED ORDER — HYDROCHLOROTHIAZIDE 25 MG PO TABS
25.0000 mg | ORAL_TABLET | Freq: Every day | ORAL | Status: DC
  Administered 2023-03-03 – 2023-03-08 (×6): 25 mg via ORAL
  Filled 2023-03-02 (×6): qty 1

## 2023-03-02 MED ORDER — LEVOTHYROXINE SODIUM 75 MCG PO TABS
75.0000 ug | ORAL_TABLET | Freq: Every day | ORAL | Status: DC
  Administered 2023-03-03 – 2023-03-08 (×6): 75 ug via ORAL
  Filled 2023-03-02: qty 1
  Filled 2023-03-02: qty 3
  Filled 2023-03-02 (×4): qty 1

## 2023-03-02 MED ORDER — FENTANYL CITRATE (PF) 100 MCG/2ML IJ SOLN
INTRAMUSCULAR | Status: AC
Start: 2023-03-02 — End: 2023-03-03
  Filled 2023-03-02: qty 2

## 2023-03-02 MED ORDER — ACETAMINOPHEN 500 MG PO TABS
1000.0000 mg | ORAL_TABLET | ORAL | Status: AC
  Administered 2023-03-02: 1000 mg via ORAL
  Filled 2023-03-02: qty 2

## 2023-03-02 MED ORDER — CELECOXIB 200 MG PO CAPS
200.0000 mg | ORAL_CAPSULE | Freq: Once | ORAL | Status: AC
  Administered 2023-03-02: 200 mg via ORAL
  Filled 2023-03-02: qty 1

## 2023-03-02 MED ORDER — FENTANYL CITRATE (PF) 100 MCG/2ML IJ SOLN: 25.0000 ug | INTRAMUSCULAR | Status: DC | PRN

## 2023-03-02 MED ORDER — ONDANSETRON 4 MG PO TBDP: 4.0000 mg | ORAL_TABLET | Freq: Four times a day (QID) | ORAL | Status: DC | PRN

## 2023-03-02 MED ORDER — LACTATED RINGERS IV SOLN: INTRAVENOUS | Status: DC | PRN

## 2023-03-02 MED ORDER — LIDOCAINE 2% (20 MG/ML) 5 ML SYRINGE
INTRAMUSCULAR | Status: AC
  Filled 2023-03-02: qty 5

## 2023-03-02 MED ORDER — IRBESARTAN 300 MG PO TABS
300.0000 mg | ORAL_TABLET | Freq: Every day | ORAL | Status: DC
Start: 2023-03-02 — End: 2023-03-08
  Administered 2023-03-03 – 2023-03-08 (×6): 300 mg via ORAL
  Filled 2023-03-02 (×6): qty 1

## 2023-03-02 MED ORDER — DEXAMETHASONE SODIUM PHOSPHATE 10 MG/ML IJ SOLN
INTRAMUSCULAR | Status: DC | PRN
  Administered 2023-03-02: 10 mg via INTRAVENOUS

## 2023-03-02 MED ORDER — ONDANSETRON HCL 4 MG/2ML IJ SOLN
INTRAMUSCULAR | Status: DC | PRN
  Administered 2023-03-02: 4 mg via INTRAVENOUS

## 2023-03-02 MED ORDER — ONDANSETRON HCL 4 MG/2ML IJ SOLN
4.0000 mg | Freq: Four times a day (QID) | INTRAMUSCULAR | Status: DC | PRN
  Administered 2023-03-02: 4 mg via INTRAVENOUS
  Filled 2023-03-02: qty 2

## 2023-03-02 MED ORDER — CHLORHEXIDINE GLUCONATE CLOTH 2 % EX PADS: 6.0000 | MEDICATED_PAD | Freq: Once | CUTANEOUS | Status: DC

## 2023-03-02 MED ORDER — FENTANYL CITRATE (PF) 250 MCG/5ML IJ SOLN
INTRAMUSCULAR | Status: AC
  Filled 2023-03-02: qty 5

## 2023-03-02 MED ORDER — CHLORHEXIDINE GLUCONATE 0.12 % MT SOLN
OROMUCOSAL | Status: AC
  Filled 2023-03-02: qty 15

## 2023-03-02 MED ORDER — PROPOFOL 10 MG/ML IV BOLUS
INTRAVENOUS | Status: DC | PRN
  Administered 2023-03-02: 200 mg via INTRAVENOUS

## 2023-03-02 MED ORDER — ACETAMINOPHEN 10 MG/ML IV SOLN: 1000.0000 mg | Freq: Once | INTRAVENOUS | Status: DC | PRN

## 2023-03-02 MED ORDER — ACETAMINOPHEN 500 MG PO TABS: 1000.0000 mg | ORAL_TABLET | Freq: Once | ORAL | Status: DC

## 2023-03-02 MED ORDER — MIDAZOLAM HCL 2 MG/2ML IJ SOLN
INTRAMUSCULAR | Status: AC
  Filled 2023-03-02: qty 2

## 2023-03-02 MED ORDER — DULOXETINE HCL 60 MG PO CPEP
60.0000 mg | ORAL_CAPSULE | Freq: Every day | ORAL | Status: DC
  Administered 2023-03-03 – 2023-03-08 (×6): 60 mg via ORAL
  Filled 2023-03-02 (×6): qty 1

## 2023-03-02 MED ORDER — PHENYLEPHRINE 80 MCG/ML (10ML) SYRINGE FOR IV PUSH (FOR BLOOD PRESSURE SUPPORT)
PREFILLED_SYRINGE | INTRAVENOUS | Status: AC
  Filled 2023-03-02: qty 10

## 2023-03-02 MED ORDER — ENOXAPARIN SODIUM 30 MG/0.3ML IJ SOSY
30.0000 mg | PREFILLED_SYRINGE | INTRAMUSCULAR | Status: DC
  Administered 2023-03-03 – 2023-03-04 (×2): 30 mg via SUBCUTANEOUS
  Filled 2023-03-02 (×2): qty 0.3

## 2023-03-02 MED ORDER — SUGAMMADEX SODIUM 200 MG/2ML IV SOLN
INTRAVENOUS | Status: DC | PRN
  Administered 2023-03-02: 200 mg via INTRAVENOUS

## 2023-03-02 MED ORDER — OXYCODONE HCL 5 MG PO TABS
5.0000 mg | ORAL_TABLET | ORAL | Status: DC | PRN
  Administered 2023-03-02: 5 mg via ORAL
  Administered 2023-03-03 (×2): 10 mg via ORAL
  Administered 2023-03-03: 5 mg via ORAL
  Administered 2023-03-04: 10 mg via ORAL
  Administered 2023-03-04: 5 mg via ORAL
  Administered 2023-03-04: 10 mg via ORAL
  Administered 2023-03-05: 5 mg via ORAL
  Administered 2023-03-05 – 2023-03-08 (×10): 10 mg via ORAL
  Filled 2023-03-02 (×6): qty 2
  Filled 2023-03-02: qty 1
  Filled 2023-03-02 (×7): qty 2
  Filled 2023-03-02: qty 1
  Filled 2023-03-02: qty 2
  Filled 2023-03-02: qty 1
  Filled 2023-03-02 (×2): qty 2

## 2023-03-02 MED ORDER — SUCCINYLCHOLINE CHLORIDE 200 MG/10ML IV SOSY
PREFILLED_SYRINGE | INTRAVENOUS | Status: AC
  Filled 2023-03-02: qty 10

## 2023-03-02 MED ORDER — LIDOCAINE 2% (20 MG/ML) 5 ML SYRINGE
INTRAMUSCULAR | Status: DC | PRN
  Administered 2023-03-02: 100 mg via INTRAVENOUS

## 2023-03-02 MED ORDER — MIDAZOLAM HCL 2 MG/2ML IJ SOLN
INTRAMUSCULAR | Status: DC | PRN
  Administered 2023-03-02: 2 mg via INTRAVENOUS

## 2023-03-02 MED ORDER — DOCUSATE SODIUM 100 MG PO CAPS
100.0000 mg | ORAL_CAPSULE | Freq: Two times a day (BID) | ORAL | Status: DC
  Administered 2023-03-02 – 2023-03-08 (×12): 100 mg via ORAL
  Filled 2023-03-02 (×12): qty 1

## 2023-03-02 MED ORDER — CEFAZOLIN SODIUM-DEXTROSE 2-4 GM/100ML-% IV SOLN
2.0000 g | INTRAVENOUS | Status: AC
  Administered 2023-03-02: 2 g via INTRAVENOUS
  Filled 2023-03-02: qty 100

## 2023-03-02 SURGICAL SUPPLY — 42 items
APPLIER CLIP 5 13 M/L LIGAMAX5 (MISCELLANEOUS) ×1 IMPLANT
BAG COUNTER SPONGE SURGICOUNT (BAG) ×1 IMPLANT
BLADE CLIPPER SURG (BLADE) IMPLANT
CANISTER SUCT 3000ML PPV (MISCELLANEOUS) ×1 IMPLANT
CHLORAPREP W/TINT 26 (MISCELLANEOUS) ×1 IMPLANT
CLIP APPLIE 5 13 M/L LIGAMAX5 (MISCELLANEOUS) ×1 IMPLANT
COVER SURGICAL LIGHT HANDLE (MISCELLANEOUS) ×1 IMPLANT
DERMABOND ADVANCED .7 DNX12 (GAUZE/BANDAGES/DRESSINGS) ×1 IMPLANT
DRAIN CHANNEL 19F RND (DRAIN) IMPLANT
ELECT REM PT RETURN 15FT ADLT (MISCELLANEOUS) IMPLANT
ELECT REM PT RETURN 9FT ADLT (ELECTROSURGICAL) ×1 IMPLANT
ELECTRODE REM PT RTRN 9FT ADLT (ELECTROSURGICAL) ×1 IMPLANT
EVACUATOR SILICONE 100CC (DRAIN) IMPLANT
GLOVE BIO SURGEON STRL SZ7.5 (GLOVE) ×1 IMPLANT
GOWN STRL REUS W/ TWL LRG LVL3 (GOWN DISPOSABLE) ×3 IMPLANT
IRRIG SUCT STRYKERFLOW 2 WTIP (MISCELLANEOUS) ×1 IMPLANT
IRRIGATION SUCT STRKRFLW 2 WTP (MISCELLANEOUS) ×1 IMPLANT
KIT BASIN OR (CUSTOM PROCEDURE TRAY) ×1 IMPLANT
KIT IMAGING PINPOINTPAQ (MISCELLANEOUS) IMPLANT
KIT TURNOVER KIT B (KITS) ×1 IMPLANT
NS IRRIG 1000ML POUR BTL (IV SOLUTION) ×1 IMPLANT
PAD ARMBOARD 7.5X6 YLW CONV (MISCELLANEOUS) ×1 IMPLANT
SCISSORS LAP 5X35 DISP (ENDOMECHANICALS) ×1 IMPLANT
SET TUBE SMOKE EVAC HIGH FLOW (TUBING) ×1 IMPLANT
SHEARS 1100 HARMONIC 36 (ELECTROSURGICAL) IMPLANT
SLEEVE Z-THREAD 5X100MM (TROCAR) ×2 IMPLANT
SPECIMEN JAR SMALL (MISCELLANEOUS) ×1 IMPLANT
SUT ETHILON 2 0 FS 18 (SUTURE) IMPLANT
SUT MNCRL AB 4-0 PS2 18 (SUTURE) ×1 IMPLANT
SUT VIC AB 2-0 CT2 18 VCP726D (SUTURE) IMPLANT
SYS BAG RETRIEVAL 10MM (BASKET) ×2 IMPLANT
SYSTEM BAG RETRIEVAL 10MM (BASKET) ×2 IMPLANT
TOWEL GREEN STERILE (TOWEL DISPOSABLE) ×1 IMPLANT
TOWEL GREEN STERILE FF (TOWEL DISPOSABLE) ×1 IMPLANT
TRAY LAPAROSCOPIC MC (CUSTOM PROCEDURE TRAY) ×1 IMPLANT
TROCAR BALLN 12MMX100 BLUNT (TROCAR) ×1 IMPLANT
TROCAR Z-THREAD OPTICAL 5X100M (TROCAR) ×1 IMPLANT
TUBE CONNECTING 12X1/4 (SUCTIONS) IMPLANT
WARMER LAPAROSCOPE (MISCELLANEOUS) ×1 IMPLANT
WATER STERILE IRR 1000ML POUR (IV SOLUTION) ×1 IMPLANT
YANKAUER SUCT BULB TIP NO VENT (SUCTIONS) IMPLANT
vcp IMPLANT

## 2023-03-02 NOTE — Anesthesia Postprocedure Evaluation (Addendum)
 Anesthesia Post Note  Patient: Tony Walters  Procedure(s) Performed: LAPAROSCOPIC SUBTOTAL CHOLECYSTECTOMY (Abdomen)     Patient location during evaluation: PACU Anesthesia Type: General Level of consciousness: awake and alert Pain management: pain level controlled Vital Signs Assessment: post-procedure vital signs reviewed and stable Respiratory status: spontaneous breathing, nonlabored ventilation, respiratory function stable and patient connected to nasal cannula oxygen Cardiovascular status: blood pressure returned to baseline and stable Postop Assessment: no apparent nausea or vomiting Anesthetic complications: no Comments: See airway note. Easy mask with OA. Difficult DL x 2. Intubated on first attempt with glidescope blade #4   Encounter Notable Events  Notable Event Outcome Phase Comment  Difficult to intubate - unexpected  Intraprocedure Filed from anesthesia note documentation.    Last Vitals:  Vitals:   03/02/23 1515 03/02/23 1520  BP: (!) 157/88   Pulse: 66 68  Resp: 15 18  Temp:  36.6 C  SpO2: 95% 93%    Last Pain:  Vitals:   03/02/23 1500  TempSrc:   PainSc: Asleep                 Thom JONELLE Peoples

## 2023-03-02 NOTE — Transfer of Care (Signed)
 Immediate Anesthesia Transfer of Care Note  Patient: Tony Walters  Procedure(s) Performed: LAPAROSCOPIC SUBTOTAL CHOLECYSTECTOMY (Abdomen)  Patient Location: PACU  Anesthesia Type:General  Level of Consciousness: awake, alert , and oriented  Airway & Oxygen Therapy: Patient Spontanous Breathing  Post-op Assessment: Report given to RN  Post vital signs: Reviewed and stable  Last Vitals:  Vitals Value Taken Time  BP 158/98 03/02/23 1426  Temp    Pulse 67 03/02/23 1429  Resp 17 03/02/23 1429  SpO2 91 % 03/02/23 1429  Vitals shown include unfiled device data.  Last Pain:  Vitals:   03/02/23 0955  TempSrc:   PainSc: 0-No pain      Patients Stated Pain Goal: 0 (03/02/23 0955)  Complications:  Encounter Notable Events  Notable Event Outcome Phase Comment  Difficult to intubate - unexpected  Intraprocedure Filed from anesthesia note documentation.

## 2023-03-02 NOTE — Anesthesia Preprocedure Evaluation (Signed)
 Anesthesia Evaluation  Patient identified by MRN, date of birth, ID band Patient awake    Reviewed: Allergy & Precautions, H&P , NPO status , Patient's Chart, lab work & pertinent test results  Airway Mallampati: II  TM Distance: >3 FB Neck ROM: Full    Dental no notable dental hx.    Pulmonary neg pulmonary ROS, Current Smoker and Patient abstained from smoking.   Pulmonary exam normal breath sounds clear to auscultation       Cardiovascular hypertension, Normal cardiovascular exam Rhythm:Regular Rate:Normal     Neuro/Psych  PSYCHIATRIC DISORDERS Anxiety Depression    negative neurological ROS     GI/Hepatic Neg liver ROS,,,Gallstones   Endo/Other  Hypothyroidism    Renal/GU negative Renal ROS  negative genitourinary   Musculoskeletal negative musculoskeletal ROS (+)    Abdominal   Peds negative pediatric ROS (+)  Hematology negative hematology ROS (+)   Anesthesia Other Findings   Reproductive/Obstetrics negative OB ROS                             Anesthesia Physical Anesthesia Plan  ASA: 2  Anesthesia Plan: General   Post-op Pain Management: Tylenol  PO (pre-op)* and Celebrex  PO (pre-op)*   Induction: Intravenous  PONV Risk Score and Plan: 2 and Ondansetron , Dexamethasone  and Treatment may vary due to age or medical condition  Airway Management Planned: Oral ETT  Additional Equipment:   Intra-op Plan:   Post-operative Plan: Extubation in OR  Informed Consent: I have reviewed the patients History and Physical, chart, labs and discussed the procedure including the risks, benefits and alternatives for the proposed anesthesia with the patient or authorized representative who has indicated his/her understanding and acceptance.     Dental advisory given  Plan Discussed with: CRNA  Anesthesia Plan Comments:        Anesthesia Quick Evaluation

## 2023-03-02 NOTE — Interval H&P Note (Signed)
 History and Physical Interval Note:  03/02/2023 11:23 AM  Tony Walters Tony Walters  has presented today for surgery, with the diagnosis of Gallstones.  The various methods of treatment have been discussed with the patient and family. After consideration of risks, benefits and other options for treatment, the patient has consented to  Procedure(s): LAPAROSCOPIC CHOLECYSTECTOMY WITH POSSIBLE CHOLANGIOGRAM (N/A) as a surgical intervention.  The patient's history has been reviewed, patient examined, no change in status, stable for surgery.  I have reviewed the patient's chart and labs.  Questions were answered to the patient's satisfaction.     Deward Null III

## 2023-03-02 NOTE — H&P (Signed)
 Expand All Collapse All   Reason for Consult: Abdominal pain Referring Physician: Dr Tobie Osgood Tony Walters is an 48 y.o. male.  HPI: The patient is a 48 year old black male who presents with abdominal pain that started a week ago.  He reported the pain to be in his upper abdomen.  He thought he had food poisoning.  The pain never got better so he finally came to the emergency department.  The pain was associated with some nausea.  He was found to have a large stone in the gallbladder and a 10 mm stone in his common bile duct.  He underwent ERCP  and they were unsuccessful at getting the stone out of the common bile duct.  A stent was placed.       Past Medical History:  Diagnosis Date   Chicken pox as a child   Fatty liver     Grief reaction 04/04/2013   HTN (hypertension) 04/04/2013   PTSD (post-traumatic stress disorder)     Unspecified hypothyroidism 04/07/2013               Past Surgical History:  Procedure Laterality Date   COLONOSCOPY   02/2020   FOOT SURGERY        x4- plantar fibroma, screw in first metatarsal- both feet   HYDROCELE EXCISION / REPAIR   48 years old   WISDOM TOOTH EXTRACTION   2010               Family History  Problem Relation Age of Onset   Congestive Heart Failure Father     Hyperlipidemia Father     Hypertension Father     Kidney disease Maternal Grandmother     Diabetes Maternal Grandmother          type 2   Stroke Maternal Grandmother     Alcohol abuse Paternal Grandmother     Pneumonia Paternal Grandfather     Asthma Brother     Breast cancer Neg Hx     Colon cancer Neg Hx     Esophageal cancer Neg Hx     Stomach cancer Neg Hx     Rectal cancer Neg Hx            Social History:  reports that he has been smoking cigarettes. He has a 12.5 pack-year smoking history. He has never used smokeless tobacco. He reports current alcohol use. He reports that he does not use drugs.   Allergies:  Allergies  No Known Allergies      Medications: I have reviewed the patient's current medications.   Lab Results Last 48 Hours        Results for orders placed or performed during the hospital encounter of 12/21/22 (from the past 48 hour(s))  HIV Antibody (routine testing w rflx)     Status: None    Collection Time: 12/22/22  5:09 AM  Result Value Ref Range    HIV Screen 4th Generation wRfx Non Reactive Non Reactive      Comment: Performed at [facility], [address] [place], [place] [address]  Hepatitis panel, acute     Status: None    Collection Time: 12/22/22  5:09 AM  Result Value Ref Range    Hepatitis B Surface Ag NON REACTIVE NON REACTIVE    HCV Ab NON REACTIVE NON REACTIVE      Comment: (NOTE) Nonreactive HCV antibody screen is consistent with no HCV infections,  unless recent infection is  suspected or other evidence exists to indicate HCV infection.        Hep A IgM NON REACTIVE NON REACTIVE    Hep B C IgM NON REACTIVE NON REACTIVE      Comment: Performed at [facility], [address], [place], [place] [address]  Acetaminophen  level     Status: Abnormal    Collection Time: 12/22/22  5:09 AM  Result Value Ref Range    Acetaminophen  (Tylenol ), Serum <10 (L) 10 - 30 ug/mL      Comment: (NOTE) Therapeutic concentrations vary significantly. A range of 10-30 ug/mL  may be an effective concentration for many patients. However, some  are best treated at concentrations outside of this range. Acetaminophen  concentrations >150 ug/mL at 4 hours after ingestion  and >50 ug/mL at 12 hours after ingestion are often associated with  toxic reactions.   Performed at [facility], [address], [place], [place] [address]    Comprehensive metabolic panel     Status: Abnormal    Collection Time: 12/22/22  5:09 AM  Result Value Ref Range    Sodium 136 135 - 145 mmol/L    Potassium 3.7 3.5 - 5.1 mmol/L    Chloride 105 98 - 111 mmol/L    CO2 22 22 - 32 mmol/L    Glucose, Bld 89  70 - 99 mg/dL      Comment: Glucose reference range applies only to samples taken after fasting for at least 8 hours.    BUN 13 6 - 20 mg/dL    Creatinine, Ser 8.98 0.61 - 1.24 mg/dL    Calcium  8.6 (L) 8.9 - 10.3 mg/dL    Total Protein 6.7 6.5 - 8.1 g/dL    Albumin 3.4 (L) 3.5 - 5.0 g/dL    AST 48 (H) 15 - 41 U/L    ALT 117 (H) 0 - 44 U/L    Alkaline Phosphatase 115 38 - 126 U/L    Total Bilirubin 1.6 (H) <1.2 mg/dL    GFR, Estimated >39 >39 mL/min      Comment: (NOTE) Calculated using the CKD-EPI Creatinine Equation (2021)      Anion gap 9 5 - 15      Comment: Performed at [facility], [address], [place], [place] [address]  CBC     Status: None    Collection Time: 12/22/22  5:09 AM  Result Value Ref Range    WBC 8.6 4.0 - 10.5 K/uL    RBC 4.79 4.22 - 5.81 MIL/uL    Hemoglobin 13.2 13.0 - 17.0 g/dL    HCT 58.7 60.9 - 47.9 %    MCV 86.0 80.0 - 100.0 fL    MCH 27.6 26.0 - 34.0 pg    MCHC 32.0 30.0 - 36.0 g/dL    RDW 85.0 88.4 - 84.4 %    Platelets 195 150 - 400 K/uL    nRBC 0.0 0.0 - 0.2 %      Comment: Performed at [facility], [address], [place], [place] [address]  Comprehensive metabolic panel     Status: Abnormal    Collection Time: 12/23/22  4:40 AM  Result Value Ref Range    Sodium 135 135 - 145 mmol/L    Potassium 4.2 3.5 - 5.1 mmol/L    Chloride 102 98 - 111 mmol/L    CO2 25 22 - 32 mmol/L    Glucose, Bld 97 70 - 99 mg/dL  Comment: Glucose reference range applies only to samples taken after fasting for at least 8 hours.    BUN 14 6 - 20 mg/dL    Creatinine, Ser 8.98 0.61 - 1.24 mg/dL    Calcium  8.9 8.9 - 10.3 mg/dL    Total Protein 6.9 6.5 - 8.1 g/dL    Albumin 3.4 (L) 3.5 - 5.0 g/dL    AST 38 15 - 41 U/L    ALT 101 (H) 0 - 44 U/L    Alkaline Phosphatase 111 38 - 126 U/L    Total Bilirubin 1.3 (H) <1.2 mg/dL    GFR, Estimated >39 >39 mL/min      Comment: (NOTE) Calculated using the CKD-EPI Creatinine  Equation (2021)      Anion gap 8 5 - 15      Comment: Performed at [facility], [address] [place], [place] [address]  CBC     Status: None    Collection Time: 12/23/22  4:40 AM  Result Value Ref Range    WBC 8.0 4.0 - 10.5 K/uL    RBC 4.79 4.22 - 5.81 MIL/uL    Hemoglobin 13.3 13.0 - 17.0 g/dL    HCT 58.6 60.9 - 47.9 %    MCV 86.2 80.0 - 100.0 fL    MCH 27.8 26.0 - 34.0 pg    MCHC 32.2 30.0 - 36.0 g/dL    RDW 85.3 88.4 - 84.4 %    Platelets 222 150 - 400 K/uL    nRBC 0.0 0.0 - 0.2 %      Comment: Performed at [facility], [address], [place], [place] [address]         Imaging Results (Last 48 hours)  DG ERCP   Result Date: 12/23/2022 CLINICAL DATA:  Choledocholithiasis. EXAM: ERCP TECHNIQUE: Multiple spot images obtained with the fluoroscopic device and submitted for interpretation post-procedure. FLUOROSCOPY TIME: FLUOROSCOPY TIME 3 minutes, 56 seconds (74.8 mGy) COMPARISON:  MRCP-12/22/2022 FINDINGS: Ten spot fluoroscopic images of the right upper abdominal quadrant during ERCP are provided for review. Initial image demonstrates an ERCP probe overlying the right upper abdominal quadrant. Subsequent images demonstrate placement of a pancreatic stent and selective cannulation and opacification of the common bile duct. Subsequent images demonstrate insufflation of a balloon within the central aspect of the CBD with subsequent presumed biliary sweeping, stone extraction and sphincterotomy. Completion image demonstrates placement of a CBD stent with unchanged positioning of pancreatic stent. IMPRESSION: ERCP with presumed biliary sweeping, stone extraction and sphincterotomy and CBD and pancreatic stent placements as above. These images were submitted for radiologic interpretation only. Please see the procedural report for the amount of contrast and the fluoroscopy time utilized. Electronically Signed   By: Norleen Roulette M.D.   On: 12/23/2022  13:13    DG C-Arm 1-60 Min-No Report   Result Date: 12/23/2022 Fluoroscopy was utilized by the requesting physician.  No radiographic interpretation.    MR ABDOMEN MRCP W WO CONTAST   Result Date: 12/22/2022 CLINICAL DATA:  Right upper quadrant pain for 5 days. Cholelithiasis and biliary ductal dilatation on recent ultrasound. EXAM: MRI ABDOMEN WITHOUT AND WITH CONTRAST (INCLUDING MRCP) TECHNIQUE: Multiplanar multisequence MR imaging of the abdomen was performed both before and after the administration of intravenous contrast. Heavily T2-weighted images of the biliary and pancreatic ducts were obtained, and three-dimensional MRCP images were rendered by post processing. CONTRAST:  10mL GADAVIST  GADOBUTROL  1 MMOL/ML IV SOLN COMPARISON:  None Available. FINDINGS: Lower chest:  No acute findings. Hepatobiliary: No hepatic masses identified. A few large gallstones are seen measuring up to 2.5 cm. No evidence of acute cholecystitis. Diffuse biliary ductal dilatation is seen with common bile duct measuring 11 mm in diameter. A 10 mm calculus is seen in the distal common bile duct, as well as sludge or other tiny calculi. Pancreas: No mass or inflammatory changes seen. Mild diffuse pancreatic ductal dilatation is noted, without signs of pancreas divisum. Spleen:  Within normal limits in size and appearance. Adrenals/Urinary Tract: No suspicious masses identified. No evidence of hydronephrosis. Stomach/Bowel: Unremarkable. Vascular/Lymphatic: No pathologically enlarged lymph nodes identified. No acute vascular findings. Other:  None. Musculoskeletal:  No suspicious bone lesions identified. IMPRESSION: Cholelithiasis. No radiographic evidence of acute cholecystitis. Diffuse biliary ductal dilatation, with 10 mm calculus and sludge in distal common bile duct. Mild diffuse pancreatic ductal dilatation. No radiographic evidence of pancreatic mass or acute pancreatitis. Electronically Signed   By: Norleen DELENA Kil M.D.    On: 12/22/2022 09:24    MR 3D Recon At Scanner   Result Date: 12/22/2022 CLINICAL DATA:  Right upper quadrant pain for 5 days. Cholelithiasis and biliary ductal dilatation on recent ultrasound. EXAM: MRI ABDOMEN WITHOUT AND WITH CONTRAST (INCLUDING MRCP) TECHNIQUE: Multiplanar multisequence MR imaging of the abdomen was performed both before and after the administration of intravenous contrast. Heavily T2-weighted images of the biliary and pancreatic ducts were obtained, and three-dimensional MRCP images were rendered by post processing. CONTRAST:  10mL GADAVIST  GADOBUTROL  1 MMOL/ML IV SOLN COMPARISON:  None Available. FINDINGS: Lower chest: No acute findings. Hepatobiliary: No hepatic masses identified. A few large gallstones are seen measuring up to 2.5 cm. No evidence of acute cholecystitis. Diffuse biliary ductal dilatation is seen with common bile duct measuring 11 mm in diameter. A 10 mm calculus is seen in the distal common bile duct, as well as sludge or other tiny calculi. Pancreas: No mass or inflammatory changes seen. Mild diffuse pancreatic ductal dilatation is noted, without signs of pancreas divisum. Spleen:  Within normal limits in size and appearance. Adrenals/Urinary Tract: No suspicious masses identified. No evidence of hydronephrosis. Stomach/Bowel: Unremarkable. Vascular/Lymphatic: No pathologically enlarged lymph nodes identified. No acute vascular findings. Other:  None. Musculoskeletal:  No suspicious bone lesions identified. IMPRESSION: Cholelithiasis. No radiographic evidence of acute cholecystitis. Diffuse biliary ductal dilatation, with 10 mm calculus and sludge in distal common bile duct. Mild diffuse pancreatic ductal dilatation. No radiographic evidence of pancreatic mass or acute pancreatitis. Electronically Signed   By: Norleen DELENA Kil M.D.   On: 12/22/2022 09:24    US  Abdomen Limited RUQ (LIVER/GB)   Result Date: 12/21/2022 CLINICAL DATA:  Right upper quadrant pain EXAM:  ULTRASOUND ABDOMEN LIMITED RIGHT UPPER QUADRANT COMPARISON:  Ultrasound 05/08/2020 FINDINGS: Gallbladder: Contracted gallbladder with large 2.8 cm stone. No gallbladder wall thickening. No pericholecystic fluid. Indeterminate sonographic Murphy's sign due to pain medications. Common bile duct: Diameter: 13 mm. Liver: No focal lesion identified. Heterogenous parenchymal echotexture. Mild contour nodularity. Portal vein is patent on color Doppler imaging with normal direction of blood flow towards the liver. Other: None. IMPRESSION: 1. Cholelithiasis without evidence of acute cholecystitis. 2. Dilated common bile duct. A stone in the distal common bile duct is not excluded. Recommend correlation with LFTs. If abnormal consider ERCP, EUS, or MRCP for further evaluation. 3. Cirrhotic liver morphology. Electronically Signed   By: Norman Gatlin M.D.   On: 12/21/2022 19:18       Review of Systems  Constitutional: Negative.  HENT: Negative.    Eyes: Negative.   Respiratory: Negative.    Cardiovascular: Negative.   Gastrointestinal:  Positive for abdominal pain.  Endocrine: Negative.   Genitourinary: Negative.   Musculoskeletal: Negative.   Skin: Negative.   Allergic/Immunologic: Negative.   Neurological: Negative.   Hematological: Negative.   Psychiatric/Behavioral: Negative.      Blood pressure 122/80, pulse (!) 59, temperature 98.1 F (36.7 C), temperature source Oral, resp. rate 17, height 6' 2 (1.88 m), weight 120.2 kg, SpO2 96%. Physical Exam Vitals reviewed.  Constitutional:      General: He is not in acute distress.    Appearance: Normal appearance.  HENT:     Head: Normocephalic and atraumatic.     Right Ear: External ear normal.     Left Ear: External ear normal.     Nose: Nose normal.     Mouth/Throat:     Mouth: Mucous membranes are moist.     Pharynx: Oropharynx is clear.  Eyes:     General: No scleral icterus.    Extraocular Movements: Extraocular movements intact.      Conjunctiva/sclera: Conjunctivae normal.     Pupils: Pupils are equal, round, and reactive to light.  Cardiovascular:     Rate and Rhythm: Normal rate and regular rhythm.     Pulses: Normal pulses.     Heart sounds: Normal heart sounds.  Pulmonary:     Effort: Pulmonary effort is normal. No respiratory distress.     Breath sounds: Normal breath sounds.  Abdominal:     General: Abdomen is flat.     Tenderness: There is abdominal tenderness.     Comments: There is mild to moderate upper abdominal tenderness more so on the right  Musculoskeletal:        General: No swelling or deformity. Normal range of motion.     Cervical back: Normal range of motion and neck supple.  Skin:    General: Skin is warm and dry.     Coloration: Skin is not jaundiced.  Neurological:     General: No focal deficit present.     Mental Status: He is alert and oriented to person, place, and time.  Psychiatric:        Mood and Affect: Mood normal.        Behavior: Behavior normal.        Assessment/Plan: The patient appears to have a large stone in the gallbladder with a common bile duct stone that was not able to be removed by ERCP today.  I do think that in the long run he will benefit from having his gallbladder removed but the question of timing of the surgery is uncertain right now.   He has since had a successful ERCP and the common duct has been cleared. He is now ready for lap chole

## 2023-03-02 NOTE — Anesthesia Procedure Notes (Signed)
 Procedure Name: Intubation Date/Time: 03/02/2023 12:06 PM  Performed by: Joshua Millman, CRNAPre-anesthesia Checklist: Patient identified, Emergency Drugs available, Suction available and Patient being monitored Patient Re-evaluated:Patient Re-evaluated prior to induction Oxygen Delivery Method: Circle system utilized Preoxygenation: Pre-oxygenation with 100% oxygen Induction Type: IV induction Ventilation: Mask ventilation without difficulty Laryngoscope Size: Glidescope and 4 Grade View: Grade I Tube type: Oral Tube size: 7.0 mm Number of attempts: 3 Airway Equipment and Method: Stylet, Oral airway, Video-laryngoscopy and Rigid stylet Placement Confirmation: ETT inserted through vocal cords under direct vision, positive ETCO2 and breath sounds checked- equal and bilateral Secured at: 26 cm Tube secured with: Tape Dental Injury: Teeth and Oropharynx as per pre-operative assessment  Difficulty Due To: Difficulty was unanticipated, Difficult Airway- due to reduced neck mobility and Difficult Airway- due to anterior larynx Comments: Easy mask ventilation. DL x1 with Mac 4 blade, no view; DL x1 with Miller 3 grade 3 view; glidescope 4 with full view of glottic opening on video screen -- 7.0 ETT easily passed through cords; tube taped at 26

## 2023-03-02 NOTE — Op Note (Signed)
 03/02/2023  2:00 PM  PATIENT:  Tony Walters  48 y.o. male  PRE-OPERATIVE DIAGNOSIS:  Gallstones  POST-OPERATIVE DIAGNOSIS:  Gallstones  PROCEDURE:  Procedure(s): LAPAROSCOPIC SUBTOTAL CHOLECYSTECTOMY (N/A)  SURGEON:  Surgeons and Role:    * Curvin Deward MOULD, MD - Primary    * Dasie Leonor CROME, MD - Assisting  PHYSICIAN ASSISTANT:   ASSISTANTS: Dr. Dasie   ANESTHESIA:   local and general  EBL:  25 mL   BLOOD ADMINISTERED:none  DRAINS: (1) Blake drain(s) in the gallbladder bed of liver    LOCAL MEDICATIONS USED:  MARCAINE      SPECIMEN:  Source of Specimen:  pieces of wall of gallbladder  DISPOSITION OF SPECIMEN:  PATHOLOGY  COUNTS:  YES  TOURNIQUET:  * No tourniquets in log *  DICTATION: .Dragon Dictation    Procedure: After informed consent was obtained the patient was brought to the operating room and placed in the supine position on the operating room table. After adequate induction of general anesthesia the patient's abdomen was prepped with ChloraPrep allowed to dry and draped in usual sterile manner. An appropriate timeout was performed. The area below the umbilicus was infiltrated with quarter percent  Marcaine . A small incision was made with a 15 blade knife. The incision was carried down through the subcutaneous tissue bluntly with a hemostat and [employer] retractors. The linea alba was identified. The linea alba was incised with a 15 blade knife and each side was grasped with Coker clamps. The preperitoneal space was then probed with a hemostat until the peritoneum was opened and access was gained to the abdominal cavity. A 0 Vicryl pursestring stitch was placed in the fascia surrounding the opening. A Hassan cannula was then placed through the opening and anchored in place with the previously placed Vicryl purse string stitch. The abdomen was insufflated with carbon dioxide without difficulty. A laparoscope was inserted through the [facility] cannula in the right  upper quadrant was inspected. Next the epigastric region was infiltrated with % Marcaine . A small incision was made with a 15 blade knife. A 5 mm port was placed bluntly through this incision into the abdominal cavity under direct vision. Next 2 sites were chosen laterally on the right side of the abdomen for placement of 5 mm ports. Each of these areas was infiltrated with quarter percent Marcaine . Small stab incisions were made with a 15 blade knife. 5 mm ports were then placed bluntly through these incisions into the abdominal cavity under direct vision without difficulty.  The right upper quadrant was inspected.  I was able to identify the transverse colon densely adherent to the upper outer wall of the gallbladder.  This was so densely connected that I could not bluntly separate the 2 safely.  I was able to dissected bluntly medial to this and was able to identify the anterior wall of the gallbladder.  The entire lumen of the gallbladder seem to be taken up by the single large stone.  The gallbladder wall was very thick-walled with chronic dense inflammation.  We were not able to safely dissected out the entire length of the gallbladder.  At this point I decided to fenestrate the anterior wall of the gallbladder and remove the large stone.  The stone was placed in a laparoscopic bag and removed through the supraumbilical port.  This was done using the harmonic scalpel.  I was then able to safely remove a little bit more of the anterior wall of the gallbladder.  I did  not dissected down where the cystic duct was because of the amount of inflammation.  I also did not dissect out the entire upper part of the gallbladder wall since the colon was so densely adherent to it.  I was able to remove a good portion of the midportion of the gallbladder wall in pieces and these were removed and sent to pathology for further evaluation.  Next a 33 French round Blake drain was brought into the abdominal cavity and out  through the lateralmost 5 mm port.  The drain was placed along the fenestration of the gallbladder and the tip of the drain was placed in the lumen of what was left of the gallbladder.  The drain was anchored to the skin with a 3-0 nylon stitch.  No other abnormalities were noted on general inspection of the abdomen.  At this point the ports were removed and the fascial defect at the supraumbilical port was closed with a figure-of-eight 0 Vicryl stitch as well as the previously placed pursestring stitch.  The rest of the skin incisions were closed with interrupted 4-0 Monocryl subcuticular stitches.  Dermabond dressings and sterile drain dressings were applied.  The patient tolerated the procedure well.  At the end of the case all needle sponge and instrument counts were correct.  The patient was then awakened and taken to recovery in stable condition.  The assistant was instrumental in visualization and completion of the case.  PLAN OF CARE: Admit to inpatient   PATIENT DISPOSITION:  PACU - hemodynamically stable.   Delay start of Pharmacological VTE agent (>24hrs) due to surgical blood loss or risk of bleeding: no

## 2023-03-03 ENCOUNTER — Inpatient Hospital Stay (HOSPITAL_COMMUNITY): Payer: BC Managed Care – PPO

## 2023-03-03 DIAGNOSIS — Z9049 Acquired absence of other specified parts of digestive tract: Secondary | ICD-10-CM | POA: Diagnosis not present

## 2023-03-03 DIAGNOSIS — K838 Other specified diseases of biliary tract: Secondary | ICD-10-CM | POA: Diagnosis not present

## 2023-03-03 DIAGNOSIS — D72829 Elevated white blood cell count, unspecified: Secondary | ICD-10-CM | POA: Diagnosis not present

## 2023-03-03 DIAGNOSIS — K9189 Other postprocedural complications and disorders of digestive system: Secondary | ICD-10-CM | POA: Diagnosis not present

## 2023-03-03 LAB — COMPREHENSIVE METABOLIC PANEL
ALT: 37 U/L (ref 0–44)
AST: 38 U/L (ref 15–41)
Albumin: 3.4 g/dL — ABNORMAL LOW (ref 3.5–5.0)
Alkaline Phosphatase: 51 U/L (ref 38–126)
Anion gap: 9 (ref 5–15)
BUN: 14 mg/dL (ref 6–20)
CO2: 24 mmol/L (ref 22–32)
Calcium: 8.7 mg/dL — ABNORMAL LOW (ref 8.9–10.3)
Chloride: 106 mmol/L (ref 98–111)
Creatinine, Ser: 1.01 mg/dL (ref 0.61–1.24)
GFR, Estimated: >60 mL/min (ref >60)
Glucose, Bld: 126 mg/dL — ABNORMAL HIGH (ref 70–99)
Potassium: 4 mmol/L (ref 3.5–5.1)
Sodium: 139 mmol/L (ref 135–145)
Total Bilirubin: 1 mg/dL (ref 0.0–1.2)
Total Protein: 6.5 g/dL (ref 6.5–8.1)

## 2023-03-03 LAB — CBC
HCT: 37.6 % — ABNORMAL LOW (ref 39.0–52.0)
Hemoglobin: 12.5 g/dL — ABNORMAL LOW (ref 13.0–17.0)
MCH: 27.5 pg (ref 26.0–34.0)
MCHC: 33.2 g/dL (ref 30.0–36.0)
MCV: 82.8 fL (ref 80.0–100.0)
Platelets: 255 10*3/uL (ref 150–400)
RBC: 4.54 MIL/uL (ref 4.22–5.81)
RDW: 15.9 % — ABNORMAL HIGH (ref 11.5–15.5)
WBC: 18.7 10*3/uL — ABNORMAL HIGH (ref 4.0–10.5)
nRBC: 0 % (ref 0.0–0.2)

## 2023-03-03 MED ORDER — ACETAMINOPHEN 650 MG RE SUPP: 975.0000 mg | Freq: Three times a day (TID) | RECTAL | Status: DC

## 2023-03-03 MED ORDER — ACETAMINOPHEN 500 MG PO TABS
1000.0000 mg | ORAL_TABLET | Freq: Three times a day (TID) | ORAL | Status: DC
  Administered 2023-03-03 – 2023-03-08 (×15): 1000 mg via ORAL
  Filled 2023-03-03 (×14): qty 2

## 2023-03-03 MED ORDER — ACETAMINOPHEN 80 MG RE SUPP
1000.0000 mg | Freq: Three times a day (TID) | RECTAL | Status: DC
  Filled 2023-03-03 (×2): qty 2

## 2023-03-03 MED ORDER — SIMETHICONE 80 MG PO CHEW
80.0000 mg | CHEWABLE_TABLET | Freq: Four times a day (QID) | ORAL | Status: DC | PRN
  Administered 2023-03-03 – 2023-03-04 (×3): 80 mg via ORAL
  Filled 2023-03-03 (×3): qty 1

## 2023-03-03 MED ORDER — ACETAMINOPHEN 500 MG PO TABS
ORAL_TABLET | ORAL | Status: AC
  Filled 2023-03-03: qty 1

## 2023-03-03 NOTE — Progress Notes (Signed)
 1 Day Post-Op   Subjective/Chief Complaint: Complains of pain but seems to be manageable. No nausea   Objective: Vital signs in last 24 hours: Temp:  [97.4 F (36.3 C)-98.6 F (37 C)] 98.4 F (36.9 C) (02/07 0714) Pulse Rate:  [66-76] 72 (02/07 0714) Resp:  [12-20] 16 (02/07 0714) BP: (137-165)/(74-101) 137/74 (02/07 0714) SpO2:  [90 %-97 %] 97 % (02/07 0714) FiO2 (%):  [0 %] 0 % (02/06 1422) Weight:  [117.9 kg] 117.9 kg (02/06 0950)    Intake/Output from previous day: 02/06 0701 - 02/07 0700 In: 600 [I.V.:600] Out: 340 [Drains:315; Blood:25] Intake/Output this shift: No intake/output data recorded.  General appearance: alert and cooperative Resp: clear to auscultation bilaterally Cardio: regular rate and rhythm GI: soft, mild tenderness. Drain output bilious 315cc overnight  Lab Results:  Recent Labs    03/03/23 0521  WBC 18.7*  HGB 12.5*  HCT 37.6*  PLT 255   BMET Recent Labs    03/03/23 0521  NA 139  K 4.0  CL 106  CO2 24  GLUCOSE 126*  BUN 14  CREATININE 1.01  CALCIUM  8.7*   PT/INR No results for input(s): LABPROT, INR in the last 72 hours. ABG No results for input(s): PHART, HCO3 in the last 72 hours.  Invalid input(s): PCO2, PO2  Studies/Results: No results found.  Anti-infectives: Anti-infectives (From admission, onward)    Start     Dose/Rate Route Frequency Ordered Stop   03/02/23 1015  ceFAZolin  (ANCEF ) IVPB 2g/100 mL premix        2 g 200 mL/hr over 30 Minutes Intravenous On call to O.R. 03/02/23 0945 03/02/23 1210       Assessment/Plan: s/p Procedure(s): LAPAROSCOPIC SUBTOTAL CHOLECYSTECTOMY (N/A) Advance diet Continue drain Ambulate Consult GI in case new stent is needed to decrease bile leak POD 1 I discussed with him the possibility that the instruments used yesterday could have been inadequately sterilized. I was notified of this last night  LOS: 1 day    Deward Null III 03/03/2023

## 2023-03-03 NOTE — Consult Note (Addendum)
 Consultation Note   Referring Provider:  General Surgery PCP: Frann Mabel Mt, DO Primary Gastroenterologist: Sandor Flatter, MD        Reason for Consultation: Post-op bile leak  DOA: 03/02/2023         Hospital Day: 2   ASSESSMENT    Brief Narrative:  48 y.o. year old male with HLD, HTN, Etoh use, hepatic steatosis, cholelithiasis   Cholelithiasis / choledocholithiasis s/p ERCP with stone extraction on 1/16. Had subtotal cholecystectomy 2/6 , now with concern for bile leak.   Right shoulder pain, worse with deep inspiration.  Possibly surgical gas related. Will let Surgery team know  Leukocytosis, WBC 18.7K (post op)  Principal Problem:   Cholecystitis with cholelithiasis  PLAN:   --Case to be reviewed with our biliary endoscopist,  Dr. Avram. Will figure out timing for ERCP. The benefits and risks of ERCP with possible sphincterotomy not limited to cardiopulmonary complications of sedation, bleeding, infection, perforation,and pancreatitis were discussed with the patient who agrees to proceed.      [facility] Attending   I have taken an interval history, reviewed the chart and examined the patient. I agree with the Advanced Practitioner's note, impression and recommendations with the following additions:  It appears that the bile drainage is decreasing. This is a good sign and if persists I do not think he will need a stent to help healing. He has an existing biliary sphincterotomy which by itself changes the pressure gradient and is also a treatment for bile leak.  His shoulder pain appears related to diaphragmatic movement of the drain.  Lupita CHARLENA Avram, MD, [facility] See TRACEY on call - gastroenterology for best contact person 03/03/2023 5:04 PM  HPI   Patient seen in hospital by us  in late Nov 2024 for  mild choledocholithiasis. Underwent ERCP with sphincterotomy and sphincteroplasty (10  mm), unable to remove stone so plastic biliary stent placed.. Had repeat ERCP with stone extraction on 1/16.  Patient admitted 2/6 for laparoscopic subtotal colectomy . Drain left in gallbladder bed. GI called to see for bile leak  Today, LFT normal. WBC 18K. Afebrile  An US  in Nov 2024 described cirrhosis, not reported on MRI / MRCP,  He has a couple of Etoh beverage a week. Normal platelet count   Previous GI Evaluations   ERCP 02/09/23 - Choledocholithiasis was found. Complete removal was accomplished by balloon extraction after mechanical lithotripsy with basket. - Diffusely dilated biliary tree thought related to choledocholithisais causing obstruction - Biliary stent removed. Periampullary diverticulum   Colonoscopy 06/12/2020-3 mm rectal hyperplastic polyp and diverticulosis recall 2032    Labs and Imaging: Recent Labs    03/03/23 0521  WBC 18.7*  HGB 12.5*  HCT 37.6*  PLT 255   Recent Labs    03/03/23 0521  NA 139  K 4.0  CL 106  CO2 24  GLUCOSE 126*  BUN 14  CREATININE 1.01  CALCIUM  8.7*   Recent Labs    03/03/23 0521  PROT 6.5  ALBUMIN 3.4*  AST 38  ALT 37  ALKPHOS 51  BILITOT 1.0   No results for input(s): HEPBSAG, HCVAB, HEPAIGM, HEPBIGM in the last 72 hours. No results for input(s): LABPROT, INR in the  last 72 hours.    Past Medical History:  Diagnosis Date   Chicken pox as a child   Choledocholithiasis    Cholelithiasis    Fatty liver    Grief reaction 04/04/2013   HTN (hypertension) 04/04/2013   PTSD (post-traumatic stress disorder)    Unspecified hypothyroidism 04/07/2013    Past Surgical History:  Procedure Laterality Date   BILIARY DILATION  12/23/2022   Procedure: BILIARY DILATION;  Surgeon: Avram Lupita BRAVO, MD;  Location: [facility];  Service: Gastroenterology;;   BILIARY STENT PLACEMENT N/A 12/23/2022   Procedure: BILIARY STENT PLACEMENT;  Surgeon: Avram Lupita BRAVO, MD;  Location: [facility];  Service:  Gastroenterology;  Laterality: N/A;   COLONOSCOPY  02/2020   ENDOSCOPIC RETROGRADE CHOLANGIOPANCREATOGRAPHY (ERCP) WITH PROPOFOL  N/A 02/09/2023   Procedure: ENDOSCOPIC RETROGRADE CHOLANGIOPANCREATOGRAPHY (ERCP) WITH PROPOFOL ;  Surgeon: Avram Lupita BRAVO, MD;  Location: [facility];  Service: Gastroenterology;  Laterality: N/A;   ERCP N/A 12/23/2022   Procedure: ENDOSCOPIC RETROGRADE CHOLANGIOPANCREATOGRAPHY (ERCP);  Surgeon: Avram Lupita BRAVO, MD;  Location: [facility];  Service: Gastroenterology;  Laterality: N/A;   FOOT SURGERY     x4- plantar fibroma, screw in first metatarsal- both feet   HYDROCELE EXCISION / REPAIR  48 years old   PANCREATIC STENT PLACEMENT  12/23/2022   Procedure: PANCREATIC STENT PLACEMENT;  Surgeon: Avram Lupita BRAVO, MD;  Location: [facility];  Service: Gastroenterology;;   REMOVAL OF STONES  02/09/2023   Procedure: REMOVAL OF STONES;  Surgeon: Avram Lupita BRAVO, MD;  Location: [facility];  Service: Gastroenterology;;   ANNETT  12/23/2022   Procedure: ANNETT;  Surgeon: Avram Lupita BRAVO, MD;  Location: [facility];  Service: Gastroenterology;;   CLEDA REMOVAL  02/09/2023   Procedure: STENT REMOVAL;  Surgeon: Avram Lupita BRAVO, MD;  Location: [facility];  Service: Gastroenterology;;   STONE EXTRACTION WITH BASKET  02/09/2023   Procedure: STONE EXTRACTION WITH BASKET;  Surgeon: Avram Lupita BRAVO, MD;  Location: [facility];  Service: Gastroenterology;;   WISDOM TOOTH EXTRACTION  2010    Family History  Problem Relation Age of Onset   Congestive Heart Failure Father    Hyperlipidemia Father    Hypertension Father    Kidney disease Maternal Grandmother    Diabetes Maternal Grandmother        type 2   Stroke Maternal Grandmother    Alcohol abuse Paternal Grandmother    Pneumonia Paternal Grandfather    Asthma Brother    Breast cancer Neg Hx    Colon cancer Neg Hx    Esophageal cancer Neg Hx    Stomach cancer Neg Hx    Rectal cancer Neg Hx      Prior to Admission medications   Medication Sig Start Date End Date Taking? Authorizing Provider  DULoxetine  (CYMBALTA ) 60 MG capsule Take 1 capsule (60 mg total) by mouth daily. 11/29/22  Yes Frann Mabel Mt, DO  levothyroxine  (SYNTHROID ) 75 MCG tablet Take 1 tablet (75 mcg total) by mouth daily. 11/29/22  Yes Frann Mabel Mt, DO  olmesartan -hydrochlorothiazide  (BENICAR  HCT) 40-25 MG tablet Take 1 tablet by mouth daily. 11/29/22  Yes Frann Mabel Mt, DO  pravastatin  (PRAVACHOL ) 40 MG tablet Take 1 tablet (40 mg total) by mouth daily. 11/29/22  Yes Frann Mabel Mt, DO  celecoxib  (CELEBREX ) 200 MG capsule Take 1 capsule (200 mg total) by mouth 2 (two) times daily. Wait 3 days after ERCP before taking Patient not taking: Reported on 02/22/2023 02/09/23   Avram Lupita BRAVO, MD  dicyclomine  (BENTYL )  10 MG capsule Take 1 capsule (10 mg total) by mouth every 6 (six) hours as needed for abdominal cramping 02/17/23   Wendling, Mabel Mt, DO  docusate sodium  (COLACE) 100 MG capsule Take 1 capsule (100 mg total) by mouth 2 (two) times daily. Patient not taking: Reported on 02/22/2023 12/25/22   Tobie Yetta HERO, MD  fenofibrate  (TRICOR ) 48 MG tablet Take 1 tablet (48 mg total) by mouth daily. Patient not taking: Reported on 02/22/2023 11/29/22   Frann Mabel Mt, DO  ondansetron  (ZOFRAN -ODT) 4 MG disintegrating tablet Take 1 tablet (4 mg total) by mouth every 8 (eight) hours as needed for nausea or vomiting. 02/17/23   Frann Mabel Mt, DO  sildenafil  (VIAGRA ) 100 MG tablet Take 1/2-1 tablets (50-100 mg total) by mouth daily as needed for erectile dysfunction. 11/30/21   Frann Mabel Mt, DO  tiZANidine  (ZANAFLEX ) 4 MG tablet Take 1 tablet (4 mg total) by mouth every 6 (six) hours as needed for muscle spasms. Patient not taking: Reported on 02/22/2023 11/29/22   Frann Mabel Mt, DO  hydrochlorothiazide  (HYDRODIURIL ) 25 MG tablet Take 1 tablet (25 mg total)  by mouth daily. 08/21/19 04/20/20  Frann Mabel Mt, DO    Current Facility-Administered Medications  Medication Dose Route Frequency Provider Last Rate Last Admin   docusate sodium  (COLACE) capsule 100 mg  100 mg Oral BID Curvin Mt III, MD   100 mg at 03/03/23 1009   DULoxetine  (CYMBALTA ) DR capsule 60 mg  60 mg Oral Daily Curvin Mt III, MD   60 mg at 03/03/23 1009   enoxaparin  (LOVENOX ) injection 30 mg  30 mg Subcutaneous Q24H Curvin Mt III, MD   30 mg at 03/03/23 1008   irbesartan  (AVAPRO ) tablet 300 mg  300 mg Oral Daily Curvin Mt III, MD   300 mg at 03/03/23 1009   And   hydrochlorothiazide  (HYDRODIURIL ) tablet 25 mg  25 mg Oral Daily Curvin Mt III, MD   25 mg at 03/03/23 1009   levothyroxine  (SYNTHROID ) tablet 75 mcg  75 mcg Oral Daily Curvin Mt III, MD   75 mcg at 03/03/23 1012   morphine  (PF) 2 MG/ML injection 1-2 mg  1-2 mg Intravenous Q1H PRN Curvin Mt III, MD   2 mg at 03/02/23 2137   ondansetron  (ZOFRAN -ODT) disintegrating tablet 4 mg  4 mg Oral Q6H PRN Curvin Mt III, MD       Or   ondansetron  (ZOFRAN ) injection 4 mg  4 mg Intravenous Q6H PRN Curvin Mt III, MD   4 mg at 03/02/23 1803   oxyCODONE  (Oxy IR/ROXICODONE ) immediate release tablet 5-10 mg  5-10 mg Oral Q4H PRN Toth, Paul III, MD   10 mg at 03/03/23 1008   pantoprazole  (PROTONIX ) injection 40 mg  40 mg Intravenous QHS Curvin Mt III, MD   40 mg at 03/02/23 2136   pravastatin  (PRAVACHOL ) tablet 40 mg  40 mg Oral Daily Curvin Mt III, MD   40 mg at 03/03/23 1009   sodium chloride  flush (NS) 0.9 % injection 3-10 mL  3-10 mL Intravenous Q12H Curvin Mt III, MD   10 mL at 03/03/23 1010   sodium chloride  flush (NS) 0.9 % injection 3-10 mL  3-10 mL Intravenous PRN Curvin Mt MOULD, MD        Allergies as of 02/20/2023   (No Known Allergies)    Social History   Socioeconomic History   Marital status: Married    Spouse name: Not on file   Number  of children: Not on file   Years of education: Not on file    Highest education level: Not on file  Occupational History   Not on file  Tobacco Use   Smoking status: Every Day    Current packs/day: 0.50    Average packs/day: 0.5 packs/day for 25.0 years (12.5 ttl pk-yrs)    Types: Cigarettes   Smokeless tobacco: Never   Tobacco comments:    quit in Nov 2014 but started back 03-18-13  Vaping Use   Vaping status: Never Used  Substance and Sexual Activity   Alcohol use: Yes    Comment: occasionally   Drug use: No   Sexual activity: Not Currently    Partners: Female  Other Topics Concern   Not on file  Social History Narrative   Patient is married he has 3 children   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[employer]   He is a cigarette smoker, he has a history of heavy alcohol use and still drinks occasionally no drug use   Social Drivers of Corporate Investment Banker Strain: Not on file  Food Insecurity: No Food Insecurity (03/02/2023)   Hunger Vital Sign    Worried About Running Out of Food in the Last Year: Never true    Ran Out of Food in the Last Year: Never true  Transportation Needs: No Transportation Needs (03/02/2023)   PRAPARE - Administrator, Civil Service (Medical): No    Lack of Transportation (Non-Medical): No  Physical Activity: Not on file  Stress: Not on file  Social Connections: Not on file  Intimate Partner Violence: Not At Risk (03/02/2023)   Humiliation, Afraid, Rape, and Kick questionnaire    Fear of Current or Ex-Partner: No    Emotionally Abused: No    Physically Abused: No    Sexually Abused: No     Code Status   Code Status: Full Code  Review of Systems: All systems reviewed and negative except where noted in HPI.  Physical Exam: Vital signs in last 24 hours: Temp:  [97.4 F (36.3 C)-98.6 F (37 C)] 98.4 F (36.9 C) (02/07 0714) Pulse Rate:  [66-76] 72 (02/07 0714) Resp:  [12-20] 16 (02/07 0714) BP: (137-165)/(74-101) 137/74 (02/07 0714) SpO2:  [90 %-97 %] 97 % (02/07 0714) FiO2 (%):  [0 %] 0 % (02/06  1422)    General:  Pleasant male in NAD Psych:  Cooperative. Normal mood and affect Eyes: Pupils equal Ears:  Normal auditory acuity Nose: No deformity, discharge or lesions Neck:  Supple, no masses felt Lungs:  Clear to auscultation.  Heart:  Regular rate, regular rhythm.  Abdomen:  Soft, nondistended, nontender, active bowel sounds, no masses felt. RUQ drain containing about 50 ml of bilious looking fluid Rectal :  Deferred Msk: Symmetrical without gross deformities.  Neurologic:  Alert, oriented, grossly normal neurologically Extremities : No edema Skin:  Intact without significant lesions.    Intake/Output from previous day: 02/06 0701 - 02/07 0700 In: 600 [I.V.:600] Out: 340 [Drains:315; Blood:25] Intake/Output this shift:  Total I/O In: -  Out: 60 [Drains:60]   Vina Dasen, NP-C   03/03/2023, 12:25 PM

## 2023-03-03 NOTE — Progress Notes (Signed)
 Dr. Alethea Andes, Wife wants to know if the patient needs to be on prophylactic antibiotics due to possible inadequately sterilized instruments during surgery?

## 2023-03-03 NOTE — Progress Notes (Signed)
   03/03/23 1058  TOC Brief Assessment  Insurance and Status Reviewed  Patient has primary care physician Yes  Home environment has been reviewed spouse  Prior level of function: independent  Prior/Current Home Services No current home services  Social Drivers of Health Review SDOH reviewed no interventions necessary  Transition of care needs no transition of care needs at this time     Bedside nurse will provide JP education prior to discharge.     Transition of Care Department [facility]) has reviewed patient and no TOC needs have been identified at this time. We will continue to monitor patient advancement through interdisciplinary progression rounds. If new patient transition needs arise, please place a TOC consult.

## 2023-03-04 LAB — COMPREHENSIVE METABOLIC PANEL
ALT: 30 U/L (ref 0–44)
AST: 25 U/L (ref 15–41)
Albumin: 3.3 g/dL — ABNORMAL LOW (ref 3.5–5.0)
Alkaline Phosphatase: 57 U/L (ref 38–126)
Anion gap: 14 (ref 5–15)
BUN: 12 mg/dL (ref 6–20)
CO2: 25 mmol/L (ref 22–32)
Calcium: 9.3 mg/dL (ref 8.9–10.3)
Chloride: 98 mmol/L (ref 98–111)
Creatinine, Ser: 1.03 mg/dL (ref 0.61–1.24)
GFR, Estimated: >60 mL/min (ref >60)
Glucose, Bld: 116 mg/dL — ABNORMAL HIGH (ref 70–99)
Potassium: 3.5 mmol/L (ref 3.5–5.1)
Sodium: 137 mmol/L (ref 135–145)
Total Bilirubin: 1.8 mg/dL — ABNORMAL HIGH (ref 0.0–1.2)
Total Protein: 6.9 g/dL (ref 6.5–8.1)

## 2023-03-04 LAB — CBC
HCT: 40.4 % (ref 39.0–52.0)
Hemoglobin: 13.4 g/dL (ref 13.0–17.0)
MCH: 27.6 pg (ref 26.0–34.0)
MCHC: 33.2 g/dL (ref 30.0–36.0)
MCV: 83.3 fL (ref 80.0–100.0)
Platelets: 268 10*3/uL (ref 150–400)
RBC: 4.85 MIL/uL (ref 4.22–5.81)
RDW: 15.9 % — ABNORMAL HIGH (ref 11.5–15.5)
WBC: 19.8 10*3/uL — ABNORMAL HIGH (ref 4.0–10.5)
nRBC: 0 % (ref 0.0–0.2)

## 2023-03-04 MED ORDER — PIPERACILLIN-TAZOBACTAM 3.375 G IVPB
3.3750 g | Freq: Three times a day (TID) | INTRAVENOUS | Status: DC
  Administered 2023-03-04 – 2023-03-08 (×13): 3.375 g via INTRAVENOUS
  Filled 2023-03-04 (×13): qty 50

## 2023-03-04 MED ORDER — ENOXAPARIN SODIUM 40 MG/0.4ML IJ SOSY
40.0000 mg | PREFILLED_SYRINGE | INTRAMUSCULAR | Status: DC
  Administered 2023-03-05 – 2023-03-08 (×4): 40 mg via SUBCUTANEOUS
  Filled 2023-03-04 (×4): qty 0.4

## 2023-03-04 NOTE — Progress Notes (Signed)
 2 Days Post-Op   Subjective/Chief Complaint: Feels better today   Objective: Vital signs in last 24 hours: Temp:  [98.1 F (36.7 C)-99.6 F (37.6 C)] 99.6 F (37.6 C) (02/08 0859) Pulse Rate:  [83-106] 83 (02/08 0859) Resp:  [16-20] 17 (02/08 0859) BP: (144-172)/(93-99) 147/96 (02/08 0859) SpO2:  [90 %-94 %] 94 % (02/08 0859)    Intake/Output from previous day: 02/07 0701 - 02/08 0700 In: 200 [P.O.:200] Out: 1098 [Urine:900; Drains:198] Intake/Output this shift: No intake/output data recorded.  General appearance: alert and cooperative Resp: clear to auscultation bilaterally Cardio: regular rate and rhythm GI: soft, mild tenderness. Drain output bilious, 198cc trending down  Lab Results:  Recent Labs    03/03/23 0521 03/04/23 0436  WBC 18.7* 19.8*  HGB 12.5* 13.4  HCT 37.6* 40.4  PLT 255 268   BMET Recent Labs    03/03/23 0521 03/04/23 0436  NA 139 137  K 4.0 3.5  CL 106 98  CO2 24 25  GLUCOSE 126* 116*  BUN 14 12  CREATININE 1.01 1.03  CALCIUM  8.7* 9.3   PT/INR No results for input(s): LABPROT, INR in the last 72 hours. ABG No results for input(s): PHART, HCO3 in the last 72 hours.  Invalid input(s): PCO2, PO2  Studies/Results: DG Chest 2 View Result Date: 03/03/2023 CLINICAL DATA:  Shortness of breath shortness of breath EXAM: CHEST - 2 VIEW COMPARISON:  12/16/2022 FINDINGS: Heart and mediastinal contours within normal limits. Elevation of the right hemidiaphragm. Air under the right hemidiaphragm most likely within bowel although this is difficult to confirm on this chest x-ray. No confluent airspace opacities or effusions. No acute bony abnormality. IMPRESSION: No acute cardiopulmonary disease. Air with under the right hemidiaphragm, likely within bowel/colon, but difficult to confirm on this chest x-ray. Consider two-view abdomen series for confirmation and to rule out free intraperitoneal air. Electronically Signed   By: Franky Crease  M.D.   On: 03/03/2023 18:12    Anti-infectives: Anti-infectives (From admission, onward)    Start     Dose/Rate Route Frequency Ordered Stop   03/04/23 0730  piperacillin -tazobactam (ZOSYN ) IVPB 3.375 g        3.375 g 12.5 mL/hr over 240 Minutes Intravenous Every 8 hours 03/04/23 0639     03/02/23 1015  ceFAZolin  (ANCEF ) IVPB 2g/100 mL premix        2 g 200 mL/hr over 30 Minutes Intravenous On call to O.R. 03/02/23 0945 03/02/23 1210       Assessment/Plan: s/p Procedure(s): LAPAROSCOPIC SUBTOTAL CHOLECYSTECTOMY (N/A) Advance diet With wbc still elevated with start on zosyn  Continue drain GI following in case stent is needed but drain output is trending down. Appreciate their assistance POD 2  LOS: 2 days    Tony Walters 03/04/2023

## 2023-03-04 NOTE — Plan of Care (Signed)
  Problem: Clinical Measurements: Goal: Will remain free from infection Outcome: Progressing   Problem: Activity: Goal: Risk for activity intolerance will decrease Outcome: Progressing   Problem: Nutrition: Goal: Adequate nutrition will be maintained Outcome: Progressing   Problem: Pain Managment: Goal: General experience of comfort will improve and/or be controlled Outcome: Progressing   Problem: Safety: Goal: Ability to remain free from injury will improve Outcome: Progressing

## 2023-03-04 NOTE — Progress Notes (Signed)
 While covering for primary nurse, Ivie Maroon, RN, pt request for pain meds with pain of 9 on 1-10 pain score. Administered prn morphine  and started schedule zosyn  infusion.

## 2023-03-04 NOTE — Plan of Care (Signed)
  Problem: Education: Goal: Knowledge of General Education information will improve Description: Including pain rating scale, medication(s)/side effects and non-pharmacologic comfort measures Outcome: Progressing   Problem: Health Behavior/Discharge Planning: Goal: Ability to manage health-related needs will improve Outcome: Progressing   Problem: Clinical Measurements: Goal: Will remain free from infection Outcome: Progressing   Problem: Elimination: Goal: Will not experience complications related to bowel motility Outcome: Progressing   Problem: Pain Managment: Goal: General experience of comfort will improve and/or be controlled Outcome: Progressing   Problem: Safety: Goal: Ability to remain free from injury will improve Outcome: Progressing

## 2023-03-05 DIAGNOSIS — Z9049 Acquired absence of other specified parts of digestive tract: Secondary | ICD-10-CM | POA: Diagnosis not present

## 2023-03-05 DIAGNOSIS — D72829 Elevated white blood cell count, unspecified: Secondary | ICD-10-CM | POA: Diagnosis not present

## 2023-03-05 DIAGNOSIS — K838 Other specified diseases of biliary tract: Secondary | ICD-10-CM | POA: Diagnosis not present

## 2023-03-05 DIAGNOSIS — K9189 Other postprocedural complications and disorders of digestive system: Secondary | ICD-10-CM | POA: Diagnosis not present

## 2023-03-05 LAB — COMPREHENSIVE METABOLIC PANEL
ALT: 24 U/L (ref 0–44)
AST: 17 U/L (ref 15–41)
Albumin: 3.1 g/dL — ABNORMAL LOW (ref 3.5–5.0)
Alkaline Phosphatase: 60 U/L (ref 38–126)
Anion gap: 12 (ref 5–15)
BUN: 13 mg/dL (ref 6–20)
CO2: 28 mmol/L (ref 22–32)
Calcium: 9.1 mg/dL (ref 8.9–10.3)
Chloride: 96 mmol/L — ABNORMAL LOW (ref 98–111)
Creatinine, Ser: 1.25 mg/dL — ABNORMAL HIGH (ref 0.61–1.24)
GFR, Estimated: >60 mL/min (ref >60)
Glucose, Bld: 107 mg/dL — ABNORMAL HIGH (ref 70–99)
Potassium: 3.4 mmol/L — ABNORMAL LOW (ref 3.5–5.1)
Sodium: 136 mmol/L (ref 135–145)
Total Bilirubin: 2.4 mg/dL — ABNORMAL HIGH (ref 0.0–1.2)
Total Protein: 7.2 g/dL (ref 6.5–8.1)

## 2023-03-05 LAB — CBC
HCT: 39.3 % (ref 39.0–52.0)
Hemoglobin: 13.2 g/dL (ref 13.0–17.0)
MCH: 27.6 pg (ref 26.0–34.0)
MCHC: 33.6 g/dL (ref 30.0–36.0)
MCV: 82.2 fL (ref 80.0–100.0)
Platelets: 261 10*3/uL (ref 150–400)
RBC: 4.78 MIL/uL (ref 4.22–5.81)
RDW: 15.4 % (ref 11.5–15.5)
WBC: 17 10*3/uL — ABNORMAL HIGH (ref 4.0–10.5)
nRBC: 0 % (ref 0.0–0.2)

## 2023-03-05 NOTE — Plan of Care (Signed)
  Problem: Health Behavior/Discharge Planning: Goal: Ability to manage health-related needs will improve Outcome: Progressing   Problem: Clinical Measurements: Goal: Will remain free from infection Outcome: Progressing   Problem: Activity: Goal: Risk for activity intolerance will decrease Outcome: Progressing   Problem: Pain Managment: Goal: General experience of comfort will improve and/or be controlled Outcome: Progressing

## 2023-03-05 NOTE — Progress Notes (Signed)
 dark output from JP drain overnight this shift

## 2023-03-05 NOTE — Plan of Care (Signed)

## 2023-03-05 NOTE — Progress Notes (Signed)
 3 Days Post-Op   Subjective/Chief Complaint: Feels a little better today   Objective: Vital signs in last 24 hours: Temp:  [98.4 F (36.9 C)-99.7 F (37.6 C)] 99.7 F (37.6 C) (02/09 0521) Pulse Rate:  [88-96] 88 (02/09 0733) Resp:  [16-17] 16 (02/09 0733) BP: (137-162)/(90-100) 137/90 (02/09 0733) SpO2:  [94 %-97 %] 94 % (02/09 0733) Last BM Date : 03/01/23  Intake/Output from previous day: 02/08 0701 - 02/09 0700 In: 750 [P.O.:600; IV Piggyback:150] Out: 190 [Drains:190] Intake/Output this shift: No intake/output data recorded.  General appearance: alert and cooperative Resp: clear to auscultation bilaterally Cardio: regular rate and rhythm GI: soft, minimal tenderness. Drain output bilious, 190cc  Lab Results:  Recent Labs    03/04/23 0436 03/05/23 0526  WBC 19.8* 17.0*  HGB 13.4 13.2  HCT 40.4 39.3  PLT 268 261   BMET Recent Labs    03/04/23 0436 03/05/23 0526  NA 137 136  K 3.5 3.4*  CL 98 96*  CO2 25 28  GLUCOSE 116* 107*  BUN 12 13  CREATININE 1.03 1.25*  CALCIUM  9.3 9.1   PT/INR No results for input(s): LABPROT, INR in the last 72 hours. ABG No results for input(s): PHART, HCO3 in the last 72 hours.  Invalid input(s): PCO2, PO2  Studies/Results: DG Chest 2 View Result Date: 03/03/2023 CLINICAL DATA:  Shortness of breath shortness of breath EXAM: CHEST - 2 VIEW COMPARISON:  12/16/2022 FINDINGS: Heart and mediastinal contours within normal limits. Elevation of the right hemidiaphragm. Air under the right hemidiaphragm most likely within bowel although this is difficult to confirm on this chest x-ray. No confluent airspace opacities or effusions. No acute bony abnormality. IMPRESSION: No acute cardiopulmonary disease. Air with under the right hemidiaphragm, likely within bowel/colon, but difficult to confirm on this chest x-ray. Consider two-view abdomen series for confirmation and to rule out free intraperitoneal air. Electronically  Signed   By: Franky Crease M.D.   On: 03/03/2023 18:12    Anti-infectives: Anti-infectives (From admission, onward)    Start     Dose/Rate Route Frequency Ordered Stop   03/04/23 0730  piperacillin -tazobactam (ZOSYN ) IVPB 3.375 g        3.375 g 12.5 mL/hr over 240 Minutes Intravenous Every 8 hours 03/04/23 0639     03/02/23 1015  ceFAZolin  (ANCEF ) IVPB 2g/100 mL premix        2 g 200 mL/hr over 30 Minutes Intravenous On call to O.R. 03/02/23 0945 03/02/23 1210       Assessment/Plan: s/p Procedure(s): LAPAROSCOPIC SUBTOTAL CHOLECYSTECTOMY (N/A) Advance diet Wbc 17K starting to come down. Continue IV zosyn  Continue drain Appreciate GI input. Will follow trend of output POD 3  LOS: 3 days    Deward Null III 03/05/2023

## 2023-03-05 NOTE — Progress Notes (Addendum)
   Patient Name: Niyam Bisping Date of Encounter: 03/05/2023, 10:06 AM     Assessment and Plan  Bile leak after subtotal fenestrated cholecystectomy w/ instruments that may have not been sterile. Rising bilirubin.  Leukocytosis - now on Abx (Zosyn  - 2/8 start)   ------------------------------------------------------------------------------------------------------------------------  Persistent drainage - not unexpected - seems stable but WBC still up, bili increase, creat increase - await GSU f/u. ? Needs CT  D/w Dr. Curvin - overall he looks better so will not do anything different. We will sign off for now but can be called back.  Lupita CHARLENA Commander, MD, [facility] See TRACEY on call - gastroenterology for best contact person 03/05/2023 10:15 AM    Subjective  Feeling better, less RUQ/shoulder pain   Objective  BP (!) 137/90 (BP Location: Left Arm)   Pulse 88   Temp 99.7 F (37.6 C) (Oral)   Resp 16   Ht 6' 2 (1.88 m)   Wt 117.9 kg   SpO2 94%   BMI 33.38 kg/m   190 cc output in drain yesterday 2/8-9 , 315 out 2/7 and 198 out 2-7-8   About 75 cc in drain now (dark bilious fluid)  Recent Labs  Lab 03/03/23 0521 03/04/23 0436 03/05/23 0526  AST 38 25 17  ALT 37 30 24  ALKPHOS 51 57 60  BILITOT 1.0 1.8* 2.4*  PROT 6.5 6.9 7.2  ALBUMIN 3.4* 3.3* 3.1*     Recent Labs  Lab 03/03/23 0521 03/04/23 0436 03/05/23 0526  HGB 12.5* 13.4 13.2  HCT 37.6* 40.4 39.3  WBC 18.7* 19.8* 17.0*  PLT 255 268 261   Recent Labs  Lab 03/03/23 0521 03/04/23 0436 03/05/23 0526  NA 139 137 136  K 4.0 3.5 3.4*  CL 106 98 96*  CO2 24 25 28   GLUCOSE 126* 116* 107*  BUN 14 12 13   CREATININE 1.01 1.03 1.25*  CALCIUM  8.7* 9.3 9.1       Lupita CHARLENA Commander, MD, [facility] See AMION on call - gastroenterology for best contact person 03/05/2023 10:06 AM

## 2023-03-06 ENCOUNTER — Encounter (HOSPITAL_COMMUNITY): Payer: Self-pay | Admitting: General Surgery

## 2023-03-06 LAB — COMPREHENSIVE METABOLIC PANEL
ALT: 21 U/L (ref 0–44)
AST: 16 U/L (ref 15–41)
Albumin: 2.9 g/dL — ABNORMAL LOW (ref 3.5–5.0)
Alkaline Phosphatase: 73 U/L (ref 38–126)
Anion gap: 14 (ref 5–15)
BUN: 17 mg/dL (ref 6–20)
CO2: 28 mmol/L (ref 22–32)
Calcium: 9.2 mg/dL (ref 8.9–10.3)
Chloride: 92 mmol/L — ABNORMAL LOW (ref 98–111)
Creatinine, Ser: 1.03 mg/dL (ref 0.61–1.24)
GFR, Estimated: >60 mL/min (ref >60)
Glucose, Bld: 101 mg/dL — ABNORMAL HIGH (ref 70–99)
Potassium: 3.2 mmol/L — ABNORMAL LOW (ref 3.5–5.1)
Sodium: 134 mmol/L — ABNORMAL LOW (ref 135–145)
Total Bilirubin: 2.5 mg/dL — ABNORMAL HIGH (ref 0.0–1.2)
Total Protein: 7.3 g/dL (ref 6.5–8.1)

## 2023-03-06 LAB — CBC
HCT: 39.4 % (ref 39.0–52.0)
Hemoglobin: 13.4 g/dL (ref 13.0–17.0)
MCH: 28 pg (ref 26.0–34.0)
MCHC: 34 g/dL (ref 30.0–36.0)
MCV: 82.3 fL (ref 80.0–100.0)
Platelets: 291 10*3/uL (ref 150–400)
RBC: 4.79 MIL/uL (ref 4.22–5.81)
RDW: 15 % (ref 11.5–15.5)
WBC: 14.5 10*3/uL — ABNORMAL HIGH (ref 4.0–10.5)
nRBC: 0 % (ref 0.0–0.2)

## 2023-03-06 LAB — SURGICAL PATHOLOGY

## 2023-03-06 MED ORDER — PANTOPRAZOLE SODIUM 40 MG PO TBEC
40.0000 mg | DELAYED_RELEASE_TABLET | Freq: Every evening | ORAL | Status: DC
  Administered 2023-03-06 – 2023-03-07 (×2): 40 mg via ORAL
  Filled 2023-03-06 (×2): qty 1

## 2023-03-06 NOTE — Progress Notes (Signed)
 Education on how to take care of the JP drain was done including how to empty it. All questions has been answered. Dressing remains clean and intact. Pt has no appetite and decline multiple meal, Ensure was given and encouraged meal. Pt has no BM yet, MD was notified.

## 2023-03-07 LAB — CBC
HCT: 42.3 % (ref 39.0–52.0)
Hemoglobin: 14.2 g/dL (ref 13.0–17.0)
MCH: 27.4 pg (ref 26.0–34.0)
MCHC: 33.6 g/dL (ref 30.0–36.0)
MCV: 81.7 fL (ref 80.0–100.0)
Platelets: 348 10*3/uL (ref 150–400)
RBC: 5.18 MIL/uL (ref 4.22–5.81)
RDW: 14.7 % (ref 11.5–15.5)
WBC: 11.8 10*3/uL — ABNORMAL HIGH (ref 4.0–10.5)
nRBC: 0 % (ref 0.0–0.2)

## 2023-03-07 LAB — COMPREHENSIVE METABOLIC PANEL
ALT: 28 U/L (ref 0–44)
AST: 28 U/L (ref 15–41)
Albumin: 3.1 g/dL — ABNORMAL LOW (ref 3.5–5.0)
Alkaline Phosphatase: 109 U/L (ref 38–126)
Anion gap: 15 (ref 5–15)
BUN: 19 mg/dL (ref 6–20)
CO2: 32 mmol/L (ref 22–32)
Calcium: 9.6 mg/dL (ref 8.9–10.3)
Chloride: 90 mmol/L — ABNORMAL LOW (ref 98–111)
Creatinine, Ser: 1.32 mg/dL — ABNORMAL HIGH (ref 0.61–1.24)
GFR, Estimated: >60 mL/min (ref >60)
Glucose, Bld: 113 mg/dL — ABNORMAL HIGH (ref 70–99)
Potassium: 3.1 mmol/L — ABNORMAL LOW (ref 3.5–5.1)
Sodium: 137 mmol/L (ref 135–145)
Total Bilirubin: 1.2 mg/dL (ref 0.0–1.2)
Total Protein: 7.7 g/dL (ref 6.5–8.1)

## 2023-03-07 MED ORDER — POTASSIUM CHLORIDE 20 MEQ PO PACK
40.0000 meq | PACK | Freq: Two times a day (BID) | ORAL | Status: AC
Start: 2023-03-07 — End: 2023-03-08
  Administered 2023-03-07 – 2023-03-08 (×2): 40 meq via ORAL
  Filled 2023-03-07 (×2): qty 2

## 2023-03-07 NOTE — Progress Notes (Signed)
5 Days Post-Op   Subjective/Chief Complaint: Feeling overall better today and eager to go home. He is tolerating solid diet though appetite is low. Pain is overall well controlled. He has been ambulatory  Wife at bedside Objective: Vital signs in last 24 hours: Temp:  [98.1 F (36.7 C)-98.2 F (36.8 C)] 98.1 F (36.7 C) (02/11 0835) Pulse Rate:  [73-110] 73 (02/11 0835) Resp:  [14-18] 18 (02/11 0835) BP: (112-129)/(82-89) 114/84 (02/11 0835) SpO2:  [95 %-98 %] 97 % (02/11 0835) Last BM Date : 03/01/23  Intake/Output from previous day: 02/10 0701 - 02/11 0700 In: 740 [P.O.:690; IV Piggyback:50] Out: 6 [Drains:6] Intake/Output this shift: Total I/O In: 240 [P.O.:240] Out: -   General appearance: alert and cooperative Resp: normal work of breathing on room air Cardio: regular rate GI: soft, minimal tenderness. Drain output cloudy bilious. Incision cdi  Lab Results:  Recent Labs    03/05/23 0526 03/06/23 0612  WBC 17.0* 14.5*  HGB 13.2 13.4  HCT 39.3 39.4  PLT 261 291   BMET Recent Labs    03/05/23 0526 03/06/23 0612  NA 136 134*  K 3.4* 3.2*  CL 96* 92*  CO2 28 28  GLUCOSE 107* 101*  BUN 13 17  CREATININE 1.25* 1.03  CALCIUM 9.1 9.2   PT/INR No results for input(s): "LABPROT", "INR" in the last 72 hours. ABG No results for input(s): "PHART", "HCO3" in the last 72 hours.  Invalid input(s): "PCO2", "PO2"  Studies/Results: No results found.   Anti-infectives: Anti-infectives (From admission, onward)    Start     Dose/Rate Route Frequency Ordered Stop   03/04/23 0730  piperacillin-tazobactam (ZOSYN) IVPB 3.375 g        3.375 g 12.5 mL/hr over 240 Minutes Intravenous Every 8 hours 03/04/23 0639     03/02/23 1015  ceFAZolin (ANCEF) IVPB 2g/100 mL premix        2 g 200 mL/hr over 30 Minutes Intravenous On call to O.R. 03/02/23 0945 03/02/23 1210       Assessment/Plan: s/p Procedure(s): LAPAROSCOPIC SUBTOTAL CHOLECYSTECTOMY (N/A)  WBC improved  yesterday to 14.5 yesterday from 17 Continue drain on dc Appreciate GI input. Will follow trend of output  Recheck labs today and possible dc this afternoon pending results  POD 5  LOS: 5 days    Eric Form, [facility] 03/07/2023, 3:03 PM Please see Amion for pager number during day hours 7:00am-4:30pm

## 2023-03-07 NOTE — Discharge Instructions (Signed)
[facility] ______[facility] LAPAROSCOPIC SURGERY: POST OP INSTRUCTIONS Always review your discharge instruction sheet given to you by the facility where your surgery was performed. IF YOU HAVE DISABILITY OR FAMILY LEAVE FORMS, YOU MUST BRING THEM TO THE OFFICE FOR PROCESSING.   DO NOT GIVE THEM TO YOUR DOCTOR.  A prescription for pain medication may be given to you upon discharge.  Take your pain medication as prescribed, if needed.  If narcotic pain medicine is not needed, then you may take acetaminophen (Tylenol) or ibuprofen (Advil) as needed. Take your usually prescribed medications unless otherwise directed. If you need a refill on your pain medication, please contact your pharmacy.  They will contact our office to request authorization. Prescriptions will not be filled after 5pm or on week-ends. You should follow a light diet the first few days after arrival home, such as soup and crackers, etc.  Be sure to include lots of fluids daily. Most patients will experience some swelling and bruising in the area of the incisions.  Ice packs will help.  Swelling and bruising can take several days to resolve.  It is common to experience some constipation if taking pain medication after surgery.  Increasing fluid intake and taking a stool softener (such as Colace) will usually help or prevent this problem from occurring.  A mild laxative (Milk of Magnesia or Miralax) should be taken according to package instructions if there are no bowel movements after 48 hours. Unless discharge instructions indicate otherwise, you may remove your bandages 24-48 hours after surgery, and you may shower at that time.  You may have steri-strips (small skin tapes) in place directly over the incision.  These strips should be left on the skin for 7-10 days.  If your surgeon used skin glue on the incision, you may shower in 24 hours.  The glue will flake off over the next 2-3 weeks.  Any sutures or staples will be  removed at the office during your follow-up visit. ACTIVITIES:  You may resume regular (light) daily activities beginning the next day--such as daily self-care, walking, climbing stairs--gradually increasing activities as tolerated.  You may have sexual intercourse when it is comfortable.  Refrain from any heavy lifting or straining until approved by your doctor. You may drive when you are no longer taking prescription pain medication, you can comfortably wear a seatbelt, and you can safely maneuver your car and apply brakes. RETURN TO WORK:  __________________________________________________________ Bonita Quin should see your doctor in the office for a follow-up appointment approximately 2-3 weeks after your surgery.  Make sure that you call for this appointment within a day or two after you arrive home to insure a convenient appointment time. OTHER INSTRUCTIONS: __________________________________________________________________________________________________________________________ __________________________________________________________________________________________________________________________ WHEN TO CALL YOUR DOCTOR: Fever over 101.0 Inability to urinate Continued bleeding from incision. Increased pain, redness, or drainage from the incision. Increasing abdominal pain  The clinic staff is available to answer your questions during regular business hours.  Please don't hesitate to call and ask to speak to one of the nurses for clinical concerns.  If you have a medical emergency, go to the nearest emergency room or call 911.  A surgeon from [facility] is always on call at the hospital. [address], [address], [place], [place]  [address] ? [address] [address], [place], [place]   [address] [phone] ? [phone] ? FAX [phone] Web site: [email or web address]

## 2023-03-08 ENCOUNTER — Other Ambulatory Visit (HOSPITAL_COMMUNITY): Payer: Self-pay

## 2023-03-08 LAB — CBC
HCT: 44.8 % (ref 39.0–52.0)
Hemoglobin: 14.9 g/dL (ref 13.0–17.0)
MCH: 27.8 pg (ref 26.0–34.0)
MCHC: 33.3 g/dL (ref 30.0–36.0)
MCV: 83.6 fL (ref 80.0–100.0)
Platelets: 372 10*3/uL (ref 150–400)
RBC: 5.36 MIL/uL (ref 4.22–5.81)
RDW: 15 % (ref 11.5–15.5)
WBC: 13.2 10*3/uL — ABNORMAL HIGH (ref 4.0–10.5)
nRBC: 0 % (ref 0.0–0.2)

## 2023-03-08 LAB — BASIC METABOLIC PANEL
Anion gap: 12 (ref 5–15)
BUN: 19 mg/dL (ref 6–20)
CO2: 31 mmol/L (ref 22–32)
Calcium: 9.7 mg/dL (ref 8.9–10.3)
Chloride: 94 mmol/L — ABNORMAL LOW (ref 98–111)
Creatinine, Ser: 1.36 mg/dL — ABNORMAL HIGH (ref 0.61–1.24)
GFR, Estimated: >60 mL/min (ref >60)
Glucose, Bld: 107 mg/dL — ABNORMAL HIGH (ref 70–99)
Potassium: 3.4 mmol/L — ABNORMAL LOW (ref 3.5–5.1)
Sodium: 137 mmol/L (ref 135–145)

## 2023-03-08 MED ORDER — OXYCODONE HCL 5 MG PO TABS
5.0000 mg | ORAL_TABLET | Freq: Four times a day (QID) | ORAL | 0 refills | Status: DC | PRN
  Filled 2023-03-08: qty 15, 4d supply, fill #0

## 2023-03-08 MED ORDER — AMOXICILLIN-POT CLAVULANATE 875-125 MG PO TABS
1.0000 | ORAL_TABLET | Freq: Two times a day (BID) | ORAL | 0 refills | Status: DC
  Filled 2023-03-08: qty 10, 5d supply, fill #0

## 2023-03-08 NOTE — Progress Notes (Signed)
Contacted [facility] to confirm follow up appointments as listed on AVS. Pt does not have any appointment scheduled for February 13 th as listed on the AVS, his next appointment is on February 18th

## 2023-03-08 NOTE — Progress Notes (Signed)
Piv dcd. Site unremarkable. Patient verbalized understanding of dc instructions. Primary RN Romeo Apple demonstrated Jp and dressing care. Toc meds and supplies provided to patient.

## 2023-03-08 NOTE — Progress Notes (Signed)
6 Days Post-Op   Subjective/Chief Complaint: No complaints other than soreness at drain site   Objective: Vital signs in last 24 hours: Temp:  [98 F (36.7 C)-98.4 F (36.9 C)] 98.3 F (36.8 C) (02/12 0420) Pulse Rate:  [73-79] 73 (02/12 0420) Resp:  [17-18] 18 (02/12 0420) BP: (114-138)/(81-90) 129/90 (02/12 0420) SpO2:  [94 %-97 %] 94 % (02/12 0420) Last BM Date : 03/07/23  Intake/Output from previous day: 02/11 0701 - 02/12 0700 In: 887.2 [P.O.:720; IV Piggyback:167.2] Out: 3 [Drains:3] Intake/Output this shift: No intake/output data recorded.  General appearance: alert and cooperative Resp: clear to auscultation bilaterally Cardio: regular rate and rhythm GI: soft, nontender. Drain output minimal but bilious  Lab Results:  Recent Labs    03/07/23 1535 03/08/23 0635  WBC 11.8* 13.2*  HGB 14.2 14.9  HCT 42.3 44.8  PLT 348 372   BMET Recent Labs    03/07/23 1535 03/08/23 0635  NA 137 137  K 3.1* 3.4*  CL 90* 94*  CO2 32 31  GLUCOSE 113* 107*  BUN 19 19  CREATININE 1.32* 1.36*  CALCIUM 9.6 9.7   PT/INR No results for input(s): "LABPROT", "INR" in the last 72 hours. ABG No results for input(s): "PHART", "HCO3" in the last 72 hours.  Invalid input(s): "PCO2", "PO2"  Studies/Results: No results found.  Anti-infectives: Anti-infectives (From admission, onward)    Start     Dose/Rate Route Frequency Ordered Stop   03/04/23 0730  piperacillin-tazobactam (ZOSYN) IVPB 3.375 g        3.375 g 12.5 mL/hr over 240 Minutes Intravenous Every 8 hours 03/04/23 0639     03/02/23 1015  ceFAZolin (ANCEF) IVPB 2g/100 mL premix        2 g 200 mL/hr over 30 Minutes Intravenous On call to O.R. 03/02/23 0945 03/02/23 1210       Assessment/Plan: s/p Procedure(s): LAPAROSCOPIC SUBTOTAL CHOLECYSTECTOMY (N/A) Advance diet Discharge Continue drain  LOS: 6 days    Tony Walters III 03/08/2023

## 2023-03-10 NOTE — Discharge Summary (Signed)
Physician Discharge Summary  Patient ID: Tony Walters MRN: [id number] DOB/AGE: 50/02/1975 50 y.o.  Admit date: 03/02/2023 Discharge date: 03/10/2023  Admission Diagnoses:  Discharge Diagnoses:  Principal Problem:   Cholecystitis with cholelithiasis   Discharged Condition: good  Hospital Course: The patient underwent subtotal cholecystectomy with drain placement.  During his early postoperative course he had a persistently elevated white count.  It was also reported that his instruments during the case could have possibly been contaminated.  This was not known until the following day.  He was treated with drain and antibiotics and his white count slowly normalized.  His diet was advanced.  He was in the hospital for several days before being ready to discharge home.  Consults: None  Significant Diagnostic Studies: none  Treatments: surgery: As above  Discharge Exam: Blood pressure 135/83, pulse 84, temperature 98.3 F (36.8 C), temperature source Oral, resp. rate 18, height 6\' 2"  (1.88 m), weight 117.9 kg, SpO2 97%. GI: Soft and nontender.  Drain output minimal but bilious  Disposition: Discharge disposition: 01-Home or Self Care       Discharge Instructions     Call MD for:  difficulty breathing, headache or visual disturbances   Complete by: As directed    Call MD for:  extreme fatigue   Complete by: As directed    Call MD for:  hives   Complete by: As directed    Call MD for:  persistant dizziness or light-headedness   Complete by: As directed    Call MD for:  persistant nausea and vomiting   Complete by: As directed    Call MD for:  redness, tenderness, or signs of infection (pain, swelling, redness, odor or green/yellow discharge around incision site)   Complete by: As directed    Call MD for:  severe uncontrolled pain   Complete by: As directed    Call MD for:  temperature >100.4   Complete by: As directed    Diet - low sodium heart healthy   Complete  by: As directed    Discharge instructions   Complete by: As directed    May shower. No heavy lifting. Low fat diet, empty drain, record output, recharge bulb daily   Increase activity slowly   Complete by: As directed    No wound care   Complete by: As directed       Allergies as of 03/08/2023   No Known Allergies      Medication List     TAKE these medications    amoxicillin-clavulanate 875-125 MG tablet Commonly known as: AUGMENTIN Take 1 tablet by mouth 2 (two) times daily.   celecoxib 200 MG capsule Commonly known as: CeleBREX Take 1 capsule (200 mg total) by mouth 2 (two) times daily. Wait 3 days after ERCP before taking   dicyclomine 10 MG capsule Commonly known as: BENTYL Take 1 capsule (10 mg total) by mouth every 6 (six) hours as needed for abdominal cramping   docusate sodium 100 MG capsule Commonly known as: COLACE Take 1 capsule (100 mg total) by mouth 2 (two) times daily.   DULoxetine 60 MG capsule Commonly known as: Cymbalta Take 1 capsule (60 mg total) by mouth daily.   fenofibrate 48 MG tablet Commonly known as: Tricor Take 1 tablet (48 mg total) by mouth daily.   levothyroxine 75 MCG tablet Commonly known as: SYNTHROID Take 1 tablet (75 mcg total) by mouth daily.   olmesartan-hydrochlorothiazide 40-25 MG tablet Commonly known as: BENICAR HCT Take  1 tablet by mouth daily.   ondansetron 4 MG disintegrating tablet Commonly known as: ZOFRAN-ODT Take 1 tablet (4 mg total) by mouth every 8 (eight) hours as needed for nausea or vomiting.   oxyCODONE 5 MG immediate release tablet Commonly known as: Roxicodone Take 1 tablet (5 mg total) by mouth every 6 (six) hours as needed for severe pain (pain score 7-10).   pravastatin 40 MG tablet Commonly known as: PRAVACHOL Take 1 tablet (40 mg total) by mouth daily.   sildenafil 100 MG tablet Commonly known as: Viagra Take 1/2-1 tablets (50-100 mg total) by mouth daily as needed for erectile  dysfunction.   tiZANidine 4 MG tablet Commonly known as: Zanaflex Take 1 tablet (4 mg total) by mouth every 6 (six) hours as needed for muscle spasms.        Follow-up Information     Griselda Miner, MD. Go on 03/29/2023.   Specialty: General Surgery Why: surgical follow up 3/5 at 11:00am . Please arrive 15 minutes early to complete check in, and bring photo ID and insurance card. Contact information: [address] [place] [address] [phone]         [facility], [place]. Go on 03/09/2023.   Specialty: General Surgery Why: 2/18 at 9:30am for nurse visit for drain check.  Please arrive 30 minutes early to complete check in, and bring photo ID and insurance card. Contact information: [address] [place] [address] [phone]                Signed: Chevis Pretty III 03/10/2023, 11:42 AM

## 2023-03-14 ENCOUNTER — Other Ambulatory Visit (HOSPITAL_BASED_OUTPATIENT_CLINIC_OR_DEPARTMENT_OTHER): Payer: Self-pay

## 2023-03-14 MED ORDER — OXYCODONE HCL 5 MG PO TABS
5.0000 mg | ORAL_TABLET | ORAL | 0 refills | Status: DC | PRN
  Filled 2023-03-14: qty 10, 2d supply, fill #0

## 2023-03-25 ENCOUNTER — Encounter (HOSPITAL_COMMUNITY): Payer: Self-pay | Admitting: General Surgery

## 2023-04-06 ENCOUNTER — Encounter: Payer: Self-pay | Admitting: Family Medicine

## 2023-04-11 ENCOUNTER — Encounter: Payer: Self-pay | Admitting: Family Medicine

## 2023-04-11 ENCOUNTER — Other Ambulatory Visit: Payer: Self-pay | Admitting: Family Medicine

## 2023-04-11 ENCOUNTER — Ambulatory Visit (INDEPENDENT_AMBULATORY_CARE_PROVIDER_SITE_OTHER): Admitting: Family Medicine

## 2023-04-11 ENCOUNTER — Other Ambulatory Visit (HOSPITAL_BASED_OUTPATIENT_CLINIC_OR_DEPARTMENT_OTHER): Payer: Self-pay

## 2023-04-11 VITALS — BP 158/96 | HR 82 | Temp 98.0°F | Ht 74.0 in | Wt 265.4 lb

## 2023-04-11 DIAGNOSIS — R030 Elevated blood-pressure reading, without diagnosis of hypertension: Secondary | ICD-10-CM

## 2023-04-11 DIAGNOSIS — L0291 Cutaneous abscess, unspecified: Secondary | ICD-10-CM

## 2023-04-11 DIAGNOSIS — F411 Generalized anxiety disorder: Secondary | ICD-10-CM | POA: Diagnosis not present

## 2023-04-11 MED ORDER — BUSPIRONE HCL 7.5 MG PO TABS
7.5000 mg | ORAL_TABLET | Freq: Two times a day (BID) | ORAL | 1 refills | Status: DC
  Filled 2023-04-11: qty 60, 30d supply, fill #0
  Filled 2023-04-11 – 2023-04-13 (×2): qty 60, 30d supply, fill #1

## 2023-04-11 MED ORDER — DOXYCYCLINE HYCLATE 100 MG PO TABS
100.0000 mg | ORAL_TABLET | Freq: Two times a day (BID) | ORAL | 0 refills | Status: DC
Start: 2023-04-11 — End: 2023-04-19
  Filled 2023-04-11: qty 14, 7d supply, fill #0

## 2023-04-11 NOTE — Patient Instructions (Addendum)
 Do not shower for the rest of the day. When you do wash it, use only soap and water. Do not vigorously scrub. Keep the area clean and dry.   Things to look out for: increasing pain not relieved by ibuprofen/acetaminophen, fevers, spreading redness, increasing drainage of pus, or foul odor.  Ice/cold pack over area for 10-15 min twice daily.  OK to take Tylenol 1000 mg (2 extra strength tabs) or 975 mg (3 regular strength tabs) every 6 hours as needed.  Keep an eye on your blood pressure at home.   Let us know if you need anything.

## 2023-04-11 NOTE — Progress Notes (Signed)
 Chief Complaint  Patient presents with   Skin Check    Patient presents today for skin check.    Tony Walters is a 48 y.o. male here for a skin complaint.  Duration: 10 days Location: back Pruritic? No Painful? Yes Drainage? No Trauma? No Other associated symptoms: no fevers Therapies tried thus far: none  GAD Patient has a history of anxiety currently taking Cymbalta 60 mg daily.  Over the past several weeks it has been less efficacious.  No adverse effects and he does report compliance.  He is not currently following with a therapist.  No homicidal or suicidal ideation.  No self-medication.  Past Medical History:  Diagnosis Date   Chicken pox as a child   Choledocholithiasis    Cholelithiasis    Fatty liver    Grief reaction 04/04/2013   HTN (hypertension) 04/04/2013   PTSD (post-traumatic stress disorder)    Unspecified hypothyroidism 04/07/2013    BP (!) 158/96   Pulse 82   Temp 98 F (36.7 C)   Ht 6\' 2"  (1.88 m)   Wt 265 lb 6.4 oz (120.4 kg)   SpO2 97%   BMI 34.08 kg/m  Gen: awake, alert, appearing stated age Lungs: No accessory muscle use Skin: See below. Slight erythema. +TTP. Slight fluctuance. No drainage, excoriation Psych: Age appropriate judgment and insight  Procedure note; incision and drainage Informed consent obtained. The area was cleaned with alcohol The area was anesthetized with 6 mL of 1% lidocaine with epinephrine. Once adequate anesthesia was obtained, a cruciate incision was made with 11 blade scalpel. Approximately 12 mL of purulent material/sebum with blood was expressed. Loculations were interrupted with a curved hemostat. The area was packed with approximately 16 cm of 0.5 in iodoform gauze. The area was then dressed with gauze. There were no complications noted. The patient tolerated the procedure well.  Abscess - Plan: doxycycline (VIBRA-TABS) 100 MG tablet, PR INCISION & DRAINAGE ABSCESS SIMPLE/SINGLE  Elevated blood  pressure reading  GAD (generalized anxiety disorder) - Plan: busPIRone (BUSPAR) 7.5 MG tablet  I&D today.  Aftercare instructions verbalized and written down.  Could come back in a week to have packing pulled or he can pull himself.  7 days of doxycycline also sent in. Forgot to take his blood pressure medicine today.  Monitor at home. Chronic, uncontrolled.  Continue Cymbalta 60 mg daily.  Add BuSpar 7.5 mg twice daily.  He is planning on setting up with a counselor.  Follow-up in 1 month to recheck. The patient voiced understanding and agreement to the plan.  Jilda Roche Cumming, DO 04/11/23 12:38 PM

## 2023-04-12 ENCOUNTER — Other Ambulatory Visit (HOSPITAL_BASED_OUTPATIENT_CLINIC_OR_DEPARTMENT_OTHER): Payer: Self-pay

## 2023-04-13 ENCOUNTER — Other Ambulatory Visit (HOSPITAL_BASED_OUTPATIENT_CLINIC_OR_DEPARTMENT_OTHER): Payer: Self-pay

## 2023-04-13 ENCOUNTER — Other Ambulatory Visit: Payer: Self-pay | Admitting: Family Medicine

## 2023-04-13 DIAGNOSIS — F411 Generalized anxiety disorder: Secondary | ICD-10-CM

## 2023-04-13 DIAGNOSIS — L0291 Cutaneous abscess, unspecified: Secondary | ICD-10-CM

## 2023-04-13 NOTE — Telephone Encounter (Signed)
 Copied from CRM [id number]. Topic: Clinical - Prescription Issue >> Apr 13, 2023 12:20 PM Isabell A wrote: Reason for CRM: Patient states he accidentally discarded his medications, it was in  a plastic bag that he threw in the garbage. Pharmacy states its to soon for a refill, he would like to speak to someone in regard oto getting his medications.    doxycycline (VIBRA-TABS) 100 MG tablet  busPIRone (BUSPAR) 7.5 MG tablet

## 2023-04-13 NOTE — Telephone Encounter (Signed)
 Pt came in office stating about his meds being lost (thrown in trash by mistake) and just wanted to verify if provider sent a new rx to pharmacy - pt was informed provider was out of office today and our other provider has not had a chance to get to the message but that pt will be informed when it is taken care off.

## 2023-04-18 ENCOUNTER — Encounter: Payer: Self-pay | Admitting: Family Medicine

## 2023-04-19 ENCOUNTER — Other Ambulatory Visit: Payer: Self-pay | Admitting: Family Medicine

## 2023-04-19 ENCOUNTER — Other Ambulatory Visit (HOSPITAL_BASED_OUTPATIENT_CLINIC_OR_DEPARTMENT_OTHER): Payer: Self-pay

## 2023-04-19 DIAGNOSIS — L0291 Cutaneous abscess, unspecified: Secondary | ICD-10-CM

## 2023-04-19 MED ORDER — DOXYCYCLINE HYCLATE 100 MG PO TABS
100.0000 mg | ORAL_TABLET | Freq: Two times a day (BID) | ORAL | 0 refills | Status: DC
  Filled 2023-04-19: qty 14, 7d supply, fill #0

## 2023-04-21 DIAGNOSIS — L089 Local infection of the skin and subcutaneous tissue, unspecified: Secondary | ICD-10-CM | POA: Insufficient documentation

## 2023-04-25 ENCOUNTER — Ambulatory Visit (INDEPENDENT_AMBULATORY_CARE_PROVIDER_SITE_OTHER): Payer: BC Managed Care – PPO | Admitting: Podiatry

## 2023-04-25 ENCOUNTER — Encounter: Payer: Self-pay | Admitting: Podiatry

## 2023-04-25 DIAGNOSIS — M7751 Other enthesopathy of right foot: Secondary | ICD-10-CM

## 2023-04-25 NOTE — Progress Notes (Signed)
 He presents today for follow-up of his first metatarsophalangeal joint and his eczema.  He states seems to be doing a lot better recently maybe a little bit more tender today but I can get down on my knees I do the work that I need to do at this point.  Objective: Vital signs stable he is alert orient x 3 soft tissue overlying the first metatarsophalangeal joint is nearly completely resolved he has good range of motion first metatarsophalangeal joint and there is no pain on palpation or range of motion.  There is no eczema on the plantar aspect of the bottom of the sole.  Assessment: Well-healing eczema and scar tissue from previous surgeries.  Plan: Resolving pain follow-up with him in 1 year

## 2023-05-26 ENCOUNTER — Other Ambulatory Visit (HOSPITAL_BASED_OUTPATIENT_CLINIC_OR_DEPARTMENT_OTHER): Payer: Self-pay

## 2023-05-26 ENCOUNTER — Other Ambulatory Visit: Payer: Self-pay

## 2023-05-26 ENCOUNTER — Encounter: Payer: Self-pay | Admitting: Family Medicine

## 2023-05-26 ENCOUNTER — Ambulatory Visit (INDEPENDENT_AMBULATORY_CARE_PROVIDER_SITE_OTHER): Admitting: Family Medicine

## 2023-05-26 VITALS — BP 136/80 | HR 106 | Temp 98.0°F | Resp 16 | Ht 74.0 in | Wt 265.0 lb

## 2023-05-26 DIAGNOSIS — E039 Hypothyroidism, unspecified: Secondary | ICD-10-CM

## 2023-05-26 DIAGNOSIS — E782 Mixed hyperlipidemia: Secondary | ICD-10-CM

## 2023-05-26 DIAGNOSIS — F411 Generalized anxiety disorder: Secondary | ICD-10-CM | POA: Diagnosis not present

## 2023-05-26 DIAGNOSIS — F339 Major depressive disorder, recurrent, unspecified: Secondary | ICD-10-CM | POA: Diagnosis not present

## 2023-05-26 DIAGNOSIS — I10 Essential (primary) hypertension: Secondary | ICD-10-CM

## 2023-05-26 MED ORDER — OLMESARTAN MEDOXOMIL-HCTZ 40-25 MG PO TABS
1.0000 | ORAL_TABLET | Freq: Every day | ORAL | 1 refills | Status: DC
  Filled 2023-05-26: qty 30, 30d supply, fill #0

## 2023-05-26 MED ORDER — LEVOTHYROXINE SODIUM 75 MCG PO TABS
75.0000 ug | ORAL_TABLET | Freq: Every day | ORAL | 1 refills | Status: DC
  Filled 2023-05-26: qty 30, 30d supply, fill #0

## 2023-05-26 MED ORDER — ARIPIPRAZOLE 2 MG PO TABS
2.0000 mg | ORAL_TABLET | Freq: Every day | ORAL | 1 refills | Status: DC
  Filled 2023-05-26: qty 30, 30d supply, fill #0

## 2023-05-26 MED ORDER — PRAVASTATIN SODIUM 40 MG PO TABS
40.0000 mg | ORAL_TABLET | Freq: Every day | ORAL | 1 refills | Status: DC
  Filled 2023-05-26: qty 30, 30d supply, fill #0

## 2023-05-26 MED ORDER — FENOFIBRATE 48 MG PO TABS
48.0000 mg | ORAL_TABLET | Freq: Every day | ORAL | 1 refills | Status: DC
  Filled 2023-05-26: qty 30, 30d supply, fill #0

## 2023-05-26 NOTE — Progress Notes (Signed)
 Chief Complaint  Patient presents with   Follow-up    Follow up    Subjective Tony Walters presents for f/u anxiety/depression.  Pt is currently being treated with BuSpar  7.5 mg twice daily. Failed Paxil , Cymbalta  and Lexapro .  Reports no improvement since treatment. Irritability main issue.  No thoughts of harming self or others. No self-medication with alcohol, prescription drugs or illicit drugs. Pt is starting to follow with a counselor/psychologist.  Past Medical History:  Diagnosis Date   Chicken pox as a child   Choledocholithiasis    Cholelithiasis    Fatty liver    Grief reaction 04/04/2013   HTN (hypertension) 04/04/2013   PTSD (post-traumatic stress disorder)    Unspecified hypothyroidism 04/07/2013   Allergies as of 05/26/2023   No Known Allergies      Medication List        Accurate as of May 26, 2023  9:28 AM. If you have any questions, ask your nurse or doctor.          STOP taking these medications    busPIRone  7.5 MG tablet Commonly known as: BUSPAR  Stopped by: Tony Walters       TAKE these medications    ARIPiprazole  2 MG tablet Commonly known as: Abilify  Take 1 tablet (2 mg total) by mouth daily. Started by: Jobe Mulder   fenofibrate  48 MG tablet Commonly known as: Tricor  Take 1 tablet (48 mg total) by mouth daily.   levothyroxine  75 MCG tablet Commonly known as: SYNTHROID  Take 1 tablet (75 mcg total) by mouth daily.   olmesartan -hydrochlorothiazide  40-25 MG tablet Commonly known as: BENICAR  HCT Take 1 tablet by mouth daily.   pravastatin  40 MG tablet Commonly known as: PRAVACHOL  Take 1 tablet (40 mg total) by mouth daily.   sildenafil  100 MG tablet Commonly known as: Viagra  Take 1/2-1 tablets (50-100 mg total) by mouth daily as needed for erectile dysfunction.        Exam BP 136/80 (BP Location: Left Arm, Patient Position: Sitting)   Pulse (!) 106   Temp 98 F (36.7 C) (Oral)   Resp 16    Ht 6\' 2"  (1.88 m)   Wt 265 lb (120.2 kg)   SpO2 96%   BMI 34.02 kg/m  General:  well developed, well nourished, in no apparent distress Lungs:  No respiratory distress Psych: well oriented with normal range of affect and age-appropriate judgement/insight, alert and oriented x4.  Assessment and Plan  GAD (generalized anxiety disorder) - Plan: ARIPiprazole  (ABILIFY ) 2 MG tablet, Ambulatory referral to Psychiatry  Depression, recurrent (HCC) - Plan: ARIPiprazole  (ABILIFY ) 2 MG tablet, Ambulatory referral to Psychiatry  Acquired hypothyroidism - Plan: levothyroxine  (SYNTHROID ) 75 MCG tablet  Mixed hyperlipidemia - Plan: pravastatin  (PRAVACHOL ) 40 MG tablet  Essential hypertension - Plan: olmesartan -hydrochlorothiazide  (BENICAR  HCT) 40-25 MG tablet  Chronic, uncontrolled.  Stop buspirone .  Start Abilify  2 mg daily.  He has failed Paxil , Lexapro , and Cymbalta  other than the BuSpar .  Counseled on exercise.  He is in the process of setting up with a counselor.  Start the process for psychiatry referral.  Self-referral resources provided in his AVS.  Referral placed today.  I will see him in 1 month to recheck. The patient voiced understanding and agreement to the plan.  Tony Dials [place], Tony Walters 05/26/23 9:28 AM

## 2023-05-26 NOTE — Patient Instructions (Signed)
Aim to do some physical exertion for 150 minutes per week. This is typically divided into 5 days per week, 30 minutes per day. The activity should be enough to get your heart rate up. Anything is better than nothing if you have time constraints.  [facility] [address] [place], [place] [address] [phone]  [facility] [address] [place], [place] [address] [phone]  [facility] [address] [facility] [address] [place], [place] [address] [phone]  [facility] [address], [place], [place], #[phone] [address], [place], [place], #[phone]  [facility] [address] [place], [place] [address] [phone]  [facility] [address] [place], [place] [phone]  [facility] [address] [place], [place] [phone]  [facility] [address] [place], [place]   [phone].   Please go online to complete a form to submit first.  [facility]  [address] [place], [place] [phone].     [facility] -  local practices located at: [address] [place], [place]. [phone]. [address] [place], [place], [place].  [phone]. [address], [place], [place].  [phone].  Contact one of these offices sooner than later as it can take 2-3 months to get a new patient appointment.   Let us know if you need anything.

## 2023-06-05 ENCOUNTER — Other Ambulatory Visit (HOSPITAL_BASED_OUTPATIENT_CLINIC_OR_DEPARTMENT_OTHER): Payer: Self-pay

## 2023-06-06 ENCOUNTER — Other Ambulatory Visit (HOSPITAL_BASED_OUTPATIENT_CLINIC_OR_DEPARTMENT_OTHER): Payer: Self-pay

## 2023-06-30 ENCOUNTER — Other Ambulatory Visit (HOSPITAL_BASED_OUTPATIENT_CLINIC_OR_DEPARTMENT_OTHER): Payer: Self-pay

## 2023-06-30 ENCOUNTER — Encounter: Payer: Self-pay | Admitting: Family Medicine

## 2023-06-30 ENCOUNTER — Ambulatory Visit (INDEPENDENT_AMBULATORY_CARE_PROVIDER_SITE_OTHER): Admitting: Family Medicine

## 2023-06-30 VITALS — BP 146/86 | HR 84 | Temp 98.0°F | Resp 16 | Ht 74.0 in | Wt 267.6 lb

## 2023-06-30 DIAGNOSIS — I10 Essential (primary) hypertension: Secondary | ICD-10-CM | POA: Diagnosis not present

## 2023-06-30 DIAGNOSIS — F339 Major depressive disorder, recurrent, unspecified: Secondary | ICD-10-CM | POA: Diagnosis not present

## 2023-06-30 DIAGNOSIS — F411 Generalized anxiety disorder: Secondary | ICD-10-CM | POA: Diagnosis not present

## 2023-06-30 DIAGNOSIS — E039 Hypothyroidism, unspecified: Secondary | ICD-10-CM | POA: Diagnosis not present

## 2023-06-30 DIAGNOSIS — E782 Mixed hyperlipidemia: Secondary | ICD-10-CM

## 2023-06-30 MED ORDER — FENOFIBRATE 48 MG PO TABS
48.0000 mg | ORAL_TABLET | Freq: Every day | ORAL | 1 refills | Status: AC
  Filled 2023-06-30: qty 30, 30d supply, fill #0
  Filled 2023-12-05: qty 30, 30d supply, fill #1

## 2023-06-30 MED ORDER — OLMESARTAN MEDOXOMIL-HCTZ 40-25 MG PO TABS
1.0000 | ORAL_TABLET | Freq: Every day | ORAL | 1 refills | Status: AC
  Filled 2023-06-30: qty 30, 30d supply, fill #0
  Filled 2023-09-18: qty 30, 30d supply, fill #1
  Filled 2023-12-05: qty 30, 30d supply, fill #2

## 2023-06-30 MED ORDER — LEVOTHYROXINE SODIUM 75 MCG PO TABS
75.0000 ug | ORAL_TABLET | Freq: Every day | ORAL | 1 refills | Status: AC
  Filled 2023-06-30: qty 30, 30d supply, fill #0
  Filled 2023-09-18: qty 30, 30d supply, fill #1
  Filled 2023-12-05: qty 30, 30d supply, fill #2

## 2023-06-30 MED ORDER — ARIPIPRAZOLE 2 MG PO TABS
2.0000 mg | ORAL_TABLET | Freq: Every day | ORAL | 1 refills | Status: DC
  Filled 2023-06-30: qty 30, 30d supply, fill #0

## 2023-06-30 MED ORDER — PRAVASTATIN SODIUM 40 MG PO TABS
40.0000 mg | ORAL_TABLET | Freq: Every day | ORAL | 1 refills | Status: AC
  Filled 2023-06-30: qty 30, 30d supply, fill #0
  Filled 2023-12-05: qty 30, 30d supply, fill #1

## 2023-06-30 NOTE — Patient Instructions (Signed)
 Keep the diet clean and stay active.  Please take your medicine.   Let us  know if you need anything.

## 2023-06-30 NOTE — Progress Notes (Signed)
 Chief Complaint  Patient presents with   Follow-up    Follow up    Subjective Tony Walters presents for f/u anxiety/depression.  Pt is currently being treated with nothing.  Reports no improvement since treatment. No thoughts of harming self or others. No self-medication with alcohol, prescription drugs or illicit drugs. Pt is not following with a counselor/psychologist.  Patient has not been checking his blood pressure at home nor has he been taking his Benicar  HCT 40-25 mg/d routinely.  Diet is okay.  Active at work but no routine exercise.  No chest pain or shortness of breath.  When he does take his medicine, he denies any side effects.  Past Medical History:  Diagnosis Date   Chicken pox as a child   Choledocholithiasis    Cholelithiasis    Fatty liver    Grief reaction 04/04/2013   HTN (hypertension) 04/04/2013   PTSD (post-traumatic stress disorder)    Unspecified hypothyroidism 04/07/2013   Allergies as of 06/30/2023   No Known Allergies      Medication List        Accurate as of June 30, 2023  9:25 AM. If you have any questions, ask your nurse or doctor.          ARIPiprazole  2 MG tablet Commonly known as: Abilify  Take 1 tablet (2 mg total) by mouth daily.   fenofibrate  48 MG tablet Commonly known as: Tricor  Take 1 tablet (48 mg total) by mouth daily.   levothyroxine  75 MCG tablet Commonly known as: SYNTHROID  Take 1 tablet (75 mcg total) by mouth daily.   olmesartan -hydrochlorothiazide  40-25 MG tablet Commonly known as: BENICAR  HCT Take 1 tablet by mouth daily.   pravastatin  40 MG tablet Commonly known as: PRAVACHOL  Take 1 tablet (40 mg total) by mouth daily.   sildenafil  100 MG tablet Commonly known as: Viagra  Take 1/2-1 tablets (50-100 mg total) by mouth daily as needed for erectile dysfunction.        Exam BP (!) 146/86 (BP Location: Left Arm, Cuff Size: Large)   Pulse 84   Temp 98 F (36.7 C) (Oral)   Resp 16   Ht 6\' 2"   (1.88 m)   Wt 267 lb 9.6 oz (121.4 kg)   SpO2 97%   BMI 34.36 kg/m  General:  well developed, well nourished, in no apparent distress Heart: RRR Lungs:  No respiratory distress Psych: well oriented with normal range of affect and age-appropriate judgement/insight, alert and oriented x4.  Assessment and Plan  GAD (generalized anxiety disorder)  Depression, recurrent (HCC)  Essential hypertension  1/2.  Chronic, uncontrolled. Start Abilify  2 mg/d. Setting up w psychiatry team.  3.  Chronic, not controlled.  Needs to start taking medication routinely.  Monitor blood pressure at home.  F/u in 1 mo. The patient voiced understanding and agreement to the plan.  Shellie Dials [place], DO 06/30/23 9:25 AM

## 2023-07-06 ENCOUNTER — Encounter: Payer: Self-pay | Admitting: Family Medicine

## 2023-07-20 ENCOUNTER — Ambulatory Visit (HOSPITAL_COMMUNITY): Payer: Self-pay | Admitting: Psychiatry

## 2023-07-24 ENCOUNTER — Other Ambulatory Visit (HOSPITAL_BASED_OUTPATIENT_CLINIC_OR_DEPARTMENT_OTHER): Payer: Self-pay

## 2023-07-24 ENCOUNTER — Other Ambulatory Visit: Payer: Self-pay

## 2023-07-24 ENCOUNTER — Ambulatory Visit (INDEPENDENT_AMBULATORY_CARE_PROVIDER_SITE_OTHER): Admitting: Family Medicine

## 2023-07-24 ENCOUNTER — Encounter: Payer: Self-pay | Admitting: Family Medicine

## 2023-07-24 ENCOUNTER — Other Ambulatory Visit (HOSPITAL_COMMUNITY)
Admission: RE | Admit: 2023-07-24 | Discharge: 2023-07-24 | Disposition: A | Discharge status: Home / Self Care | Source: Ambulatory Visit | Attending: Family Medicine | Admitting: Family Medicine

## 2023-07-24 VITALS — BP 130/78 | HR 98 | Temp 98.0°F | Resp 16 | Ht 74.0 in | Wt 265.0 lb

## 2023-07-24 DIAGNOSIS — Z202 Contact with and (suspected) exposure to infections with a predominantly sexual mode of transmission: Secondary | ICD-10-CM | POA: Diagnosis not present

## 2023-07-24 DIAGNOSIS — F411 Generalized anxiety disorder: Secondary | ICD-10-CM

## 2023-07-24 DIAGNOSIS — Z113 Encounter for screening for infections with a predominantly sexual mode of transmission: Secondary | ICD-10-CM | POA: Diagnosis not present

## 2023-07-24 DIAGNOSIS — R040 Epistaxis: Secondary | ICD-10-CM

## 2023-07-24 DIAGNOSIS — F339 Major depressive disorder, recurrent, unspecified: Secondary | ICD-10-CM | POA: Diagnosis not present

## 2023-07-24 MED ORDER — METRONIDAZOLE 500 MG PO TABS
500.0000 mg | ORAL_TABLET | Freq: Two times a day (BID) | ORAL | 0 refills | Status: AC
  Filled 2023-07-24: qty 14, 7d supply, fill #0

## 2023-07-24 MED ORDER — ARIPIPRAZOLE 2 MG PO TABS
4.0000 mg | ORAL_TABLET | Freq: Every day | ORAL | 1 refills | Status: DC
  Filled 2023-07-24: qty 60, 30d supply, fill #0

## 2023-07-24 NOTE — Patient Instructions (Addendum)
 Give us  3-4 business days to get the results of your testing back.   Practice safe sex.   No alcohol while on this antibiotic.   Avoid digital trauma (picking your nose).  Use an air humidifier at night at least.   Use triple antibiotic ointment like Neosporin twice daily. Use Vaseline/Ayr Gel/NasoGel in between.  If you have a nosebleed, apply direct pressure for 10 minutes. If a bleed lasts longer than 15-20 minutes, go to the ER.  Consider use of Afrin spray or ice during a nosebleed to help.  Let us  know if you need anything.

## 2023-07-24 NOTE — Progress Notes (Signed)
 Chief Complaint  Patient presents with   Epistaxis    Nose Bleeds     Subjective Tony Walters presents for f/u anxiety/depression.  Pt is currently being treated with Abilify  2 mg/d.  Reports doing a bit better since treatment. No thoughts of harming self or others. No self-medication with alcohol, prescription drugs or illicit drugs. Pt is not following with a counselor/psychologist.  Patient has a history of nosebleeds from the left side.  Occasional digital trauma.  No other injury.  It seems whenever he is scratches his nose, will start to bleed.  Last bleed was last week.  Has not tried anything at home so far.  He is in a monogamous relationship with his wife.  She told him he gave her trichomonas.  He is not having any symptoms.  Around 2 months ago, he drank a copious amount of alcohol and does not remember what happened that night.  He states that his only time it could have happened but he does not remember what transpired.  He would like to be checked today.  Past Medical History:  Diagnosis Date   Chicken pox as a child   Choledocholithiasis    Cholelithiasis    Fatty liver    Grief reaction 04/04/2013   HTN (hypertension) 04/04/2013   PTSD (post-traumatic stress disorder)    Unspecified hypothyroidism 04/07/2013   Allergies as of 07/24/2023   No Known Allergies      Medication List        Accurate as of July 24, 2023  3:59 PM. If you have any questions, ask your nurse or doctor.          ARIPiprazole  2 MG tablet Commonly known as: Abilify  Take 2 tablets (4 mg total) by mouth daily. What changed: how much to take Changed by: Mabel Deward Pry   fenofibrate  48 MG tablet Commonly known as: Tricor  Take 1 tablet (48 mg total) by mouth daily.   levothyroxine  75 MCG tablet Commonly known as: SYNTHROID  Take 1 tablet (75 mcg total) by mouth daily.   metroNIDAZOLE 500 MG tablet Commonly known as: FLAGYL Take 1 tablet (500 mg total) by mouth 2  (two) times daily for 7 days. Do not drink alcohol. Started by: Mabel Deward Pry   olmesartan -hydrochlorothiazide  40-25 MG tablet Commonly known as: BENICAR  HCT Take 1 tablet by mouth daily.   pravastatin  40 MG tablet Commonly known as: PRAVACHOL  Take 1 tablet (40 mg total) by mouth daily.   sildenafil  100 MG tablet Commonly known as: Viagra  Take 1/2-1 tablets (50-100 mg total) by mouth daily as needed for erectile dysfunction.        Exam BP 130/78 (BP Location: Left Arm, Cuff Size: Large)   Pulse 98   Temp 98 F (36.7 C) (Oral)   Resp 16   Ht 6' 2 (1.88 m)   Wt 265 lb (120.2 kg)   SpO2 98%   BMI 34.02 kg/m  General:  well developed, well nourished, in no apparent distress Heart: RRR, no lower extremity edema, no bruits GI: BS+, S, NT, ND Lungs: CTAB.  No respiratory distress Psych: well oriented with normal range of affect and age-appropriate judgement/insight, alert and oriented x4.  Assessment and Plan  GAD (generalized anxiety disorder) - Plan: Urine cytology ancillary only, ARIPiprazole  (ABILIFY ) 2 MG tablet  Depression, recurrent (HCC) - Plan: Urine cytology ancillary only, ARIPiprazole  (ABILIFY ) 2 MG tablet  Screening examination for STI  Exposure to trichomonas - Plan: metroNIDAZOLE (FLAGYL) 500 MG tablet  Epistaxis  1/2.  Chronic, not currently controlled.  Increase Abilify  from 2 mg daily to 4 mg daily.  He has an appointment with the psychiatry team next month.  He will follow-up with them. 3/4.  Check ancillary testing.  Practice safe sex.  Will treat empirically for trichomonas with 7 days of metronidazole.  He will not drink alcohol while on this medication. 5.  Avoid digital trauma, air humidifier at night, Neosporin/triple antibiotic ointment when he does have issues, Vaseline in between TAO applications. The patient voiced understanding and agreement to the plan.  Mabel [place], DO 07/24/23 3:59 PM

## 2023-07-27 ENCOUNTER — Other Ambulatory Visit (HOSPITAL_BASED_OUTPATIENT_CLINIC_OR_DEPARTMENT_OTHER): Payer: Self-pay

## 2023-08-04 ENCOUNTER — Ambulatory Visit: Admitting: Family Medicine

## 2023-08-07 ENCOUNTER — Ambulatory Visit: Payer: Self-pay | Admitting: Family Medicine

## 2023-08-07 ENCOUNTER — Encounter: Payer: Self-pay | Admitting: Podiatry

## 2023-08-07 ENCOUNTER — Encounter: Payer: Self-pay | Admitting: Family Medicine

## 2023-08-07 LAB — URINE CYTOLOGY ANCILLARY ONLY
Chlamydia: NEGATIVE
Comment: NEGATIVE
Comment: NEGATIVE
Comment: NORMAL
Neisseria Gonorrhea: NEGATIVE
Trichomonas: NEGATIVE

## 2023-08-17 ENCOUNTER — Ambulatory Visit (HOSPITAL_COMMUNITY): Payer: Self-pay | Admitting: Psychiatry

## 2023-08-24 ENCOUNTER — Encounter (HOSPITAL_COMMUNITY): Payer: Self-pay | Admitting: Psychiatry

## 2023-08-24 ENCOUNTER — Other Ambulatory Visit (HOSPITAL_BASED_OUTPATIENT_CLINIC_OR_DEPARTMENT_OTHER): Payer: Self-pay

## 2023-08-24 ENCOUNTER — Ambulatory Visit (HOSPITAL_COMMUNITY): Payer: Self-pay | Admitting: Psychiatry

## 2023-08-24 DIAGNOSIS — F411 Generalized anxiety disorder: Secondary | ICD-10-CM

## 2023-08-24 DIAGNOSIS — F4310 Post-traumatic stress disorder, unspecified: Secondary | ICD-10-CM

## 2023-08-24 DIAGNOSIS — F339 Major depressive disorder, recurrent, unspecified: Secondary | ICD-10-CM | POA: Diagnosis not present

## 2023-08-24 MED ORDER — SERTRALINE HCL 25 MG PO TABS
25.0000 mg | ORAL_TABLET | Freq: Every day | ORAL | 0 refills | Status: DC
  Filled 2023-08-24: qty 30, 30d supply, fill #0

## 2023-08-24 MED ORDER — ARIPIPRAZOLE 5 MG PO TABS
5.0000 mg | ORAL_TABLET | Freq: Every day | ORAL | 0 refills | Status: DC
Start: 2023-08-24 — End: 2023-09-15
  Filled 2023-08-24: qty 30, 30d supply, fill #0
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
## 2023-08-25 ENCOUNTER — Other Ambulatory Visit: Payer: Self-pay

## 2023-08-31 ENCOUNTER — Ambulatory Visit (INDEPENDENT_AMBULATORY_CARE_PROVIDER_SITE_OTHER): Admitting: Podiatry

## 2023-08-31 ENCOUNTER — Encounter: Payer: Self-pay | Admitting: Podiatry

## 2023-08-31 DIAGNOSIS — M19079 Primary osteoarthritis, unspecified ankle and foot: Secondary | ICD-10-CM

## 2023-08-31 NOTE — Progress Notes (Signed)
 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  With the joint replacement he says it is still stiff and tight and I still cannot do the things that I would like to do on a regular basis.  The contralateral foot which we performed arthritis bunion type procedure on has also failed to render him asymptomatic he states that he feels that some of this may have developed in his younger years because his feet have been hurting since then particularly when he was playing ball or in the [employer] sliding down the ladders crawling on the metal and concrete floors with his toes bent back and limited on his knees.  He has significant knee issues as well that is bothering him.  He states that both of these things 10 to limit his ability to perform his normal activities and enjoy life that he feels that he should still be able to do it 48 years old.  Objective: Vital signs are stable he is alert oriented x 3 he has osteoarthritic change around the first metatarsal medial cuneiform joint as well as a tenderness on range of motion of the first metatarsal.  This is a both feet.  Assessment osteoarthritis limiting his activity even with attempted joint repair.  Plan: I feel the only way that this is going to ever be alleviated is just by suffering through it and finding ways to adjust to this.  The damages done the surgical corrections have been performed and without fusing the foot there is no way to eliminate the motion in these joints and the arthritic change.

## 2023-09-15 ENCOUNTER — Encounter (HOSPITAL_COMMUNITY): Payer: Self-pay | Admitting: Psychiatry

## 2023-09-15 ENCOUNTER — Telehealth (HOSPITAL_COMMUNITY): Admitting: Psychiatry

## 2023-09-15 ENCOUNTER — Other Ambulatory Visit (HOSPITAL_BASED_OUTPATIENT_CLINIC_OR_DEPARTMENT_OTHER): Payer: Self-pay

## 2023-09-15 DIAGNOSIS — F331 Major depressive disorder, recurrent, moderate: Secondary | ICD-10-CM | POA: Diagnosis not present

## 2023-09-15 DIAGNOSIS — F339 Major depressive disorder, recurrent, unspecified: Secondary | ICD-10-CM

## 2023-09-15 DIAGNOSIS — F411 Generalized anxiety disorder: Secondary | ICD-10-CM | POA: Diagnosis not present

## 2023-09-15 DIAGNOSIS — F4310 Post-traumatic stress disorder, unspecified: Secondary | ICD-10-CM | POA: Diagnosis not present

## 2023-09-15 MED ORDER — ARIPIPRAZOLE 5 MG PO TABS
5.0000 mg | ORAL_TABLET | Freq: Every day | ORAL | 0 refills | Status: DC
  Filled 2023-09-15: qty 30, 30d supply, fill #0

## 2023-09-15 MED ORDER — SERTRALINE HCL 50 MG PO TABS
75.0000 mg | ORAL_TABLET | Freq: Every day | ORAL | 0 refills | Status: DC
  Filled 2023-09-15: qty 45, 30d supply, fill #0
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
## 2023-09-18 ENCOUNTER — Other Ambulatory Visit (HOSPITAL_BASED_OUTPATIENT_CLINIC_OR_DEPARTMENT_OTHER): Payer: Self-pay

## 2023-11-06 ENCOUNTER — Other Ambulatory Visit (HOSPITAL_BASED_OUTPATIENT_CLINIC_OR_DEPARTMENT_OTHER): Payer: Self-pay

## 2023-11-06 ENCOUNTER — Encounter (HOSPITAL_COMMUNITY): Payer: Self-pay | Admitting: Psychiatry

## 2023-11-06 ENCOUNTER — Telehealth (INDEPENDENT_AMBULATORY_CARE_PROVIDER_SITE_OTHER): Admitting: Psychiatry

## 2023-11-06 DIAGNOSIS — F339 Major depressive disorder, recurrent, unspecified: Secondary | ICD-10-CM

## 2023-11-06 DIAGNOSIS — F411 Generalized anxiety disorder: Secondary | ICD-10-CM | POA: Diagnosis not present

## 2023-11-06 MED ORDER — ARIPIPRAZOLE 5 MG PO TABS
5.0000 mg | ORAL_TABLET | Freq: Every day | ORAL | 1 refills | Status: DC
  Filled 2023-11-06: qty 30, 30d supply, fill #0
  Filled 2023-12-05: qty 30, 30d supply, fill #1

## 2023-11-06 MED ORDER — SERTRALINE HCL 100 MG PO TABS
100.0000 mg | ORAL_TABLET | Freq: Every day | ORAL | 1 refills | Status: DC
  Filled 2023-11-06: qty 30, 30d supply, fill #0
  Filled 2023-12-05: qty 30, 30d supply, fill #1
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
## 2023-12-08 ENCOUNTER — Other Ambulatory Visit (HOSPITAL_BASED_OUTPATIENT_CLINIC_OR_DEPARTMENT_OTHER): Payer: Self-pay

## 2024-01-03 ENCOUNTER — Telehealth: Admitting: Family Medicine

## 2024-01-03 ENCOUNTER — Encounter: Payer: Self-pay | Admitting: Family Medicine

## 2024-01-03 DIAGNOSIS — R509 Fever, unspecified: Secondary | ICD-10-CM | POA: Diagnosis not present

## 2024-01-03 MED ORDER — OSELTAMIVIR PHOSPHATE 75 MG PO CAPS: 75.0000 mg | ORAL_CAPSULE | Freq: Two times a day (BID) | ORAL | 0 refills | Status: AC

## 2024-01-03 MED ORDER — PROMETHAZINE-DM 6.25-15 MG/5ML PO SYRP: 5.0000 mL | ORAL_SOLUTION | Freq: Four times a day (QID) | ORAL | 0 refills | Status: AC | PRN

## 2024-01-03 NOTE — Progress Notes (Signed)
 Chief Complaint  Patient presents with   Fever    Fever    Tony Walters here for URI complaints. We are interacting via web portal for an electronic face-to-face visit. I verified patient's ID using 2 identifiers. Patient agreed to proceed with visit via this method. Patient is at home, I am at office. Patient and I are present for visit.   Duration: 2 days  Associated symptoms: Fever (89 F), sinus congestion, sinus pain, sore throat, shortness of breath, myalgia, and coughing, diarrhea Denies: rhinorrhea, itchy watery eyes, ear pain, ear drainage, wheezing, and bleeding Treatment to date: Zofran  Sick contacts: Daughter sick last week  Past Medical History:  Diagnosis Date   Chicken pox as a child   Choledocholithiasis    Cholelithiasis    Fatty liver    Grief reaction 04/04/2013   HTN (hypertension) 04/04/2013   PTSD (post-traumatic stress disorder)    Unspecified hypothyroidism 04/07/2013    Objective No conversational dyspnea Age appropriate judgment and insight Nml affect and mood  Febrile illness, acute - Plan: promethazine -dextromethorphan (PROMETHAZINE -DM) 6.25-15 MG/5ML syrup, oseltamivir (TAMIFLU) 75 MG capsule  Empirically tx for flu. Cough syrup as above. Warnings about drowsiness provided. Continue to push fluids, practice good hand hygiene, cover mouth when coughing. F/u prn. If starting to experience fevers, shaking, or shortness of breath, seek immediate care. Pt voiced understanding and agreement to the plan.  Tony [place], DO 01/03/24 1:48 PM

## 2024-01-04 ENCOUNTER — Encounter: Payer: Self-pay | Admitting: Family Medicine

## 2024-02-12 ENCOUNTER — Telehealth (HOSPITAL_COMMUNITY): Admitting: Psychiatry

## 2024-02-12 ENCOUNTER — Encounter (HOSPITAL_COMMUNITY): Payer: Self-pay | Admitting: Psychiatry

## 2024-02-12 ENCOUNTER — Other Ambulatory Visit (HOSPITAL_BASED_OUTPATIENT_CLINIC_OR_DEPARTMENT_OTHER): Payer: Self-pay

## 2024-02-12 DIAGNOSIS — F411 Generalized anxiety disorder: Secondary | ICD-10-CM

## 2024-02-12 DIAGNOSIS — F339 Major depressive disorder, recurrent, unspecified: Secondary | ICD-10-CM

## 2024-02-12 DIAGNOSIS — F331 Major depressive disorder, recurrent, moderate: Secondary | ICD-10-CM | POA: Diagnosis not present

## 2024-02-12 DIAGNOSIS — F4310 Post-traumatic stress disorder, unspecified: Secondary | ICD-10-CM

## 2024-02-12 MED ORDER — SERTRALINE HCL 100 MG PO TABS
100.0000 mg | ORAL_TABLET | Freq: Every day | ORAL | 1 refills | Status: AC
  Filled 2024-02-12: qty 30, 30d supply, fill #0

## 2024-02-12 MED ORDER — ARIPIPRAZOLE 10 MG PO TABS
10.0000 mg | ORAL_TABLET | Freq: Every day | ORAL | 0 refills | Status: AC
  Filled 2024-02-12: qty 30, 30d supply, fill #0
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
## 2024-02-26 ENCOUNTER — Other Ambulatory Visit (HOSPITAL_BASED_OUTPATIENT_CLINIC_OR_DEPARTMENT_OTHER): Payer: Self-pay

## 2024-04-08 ENCOUNTER — Telehealth (HOSPITAL_COMMUNITY): Admitting: Psychiatry

## 2024-04-25 ENCOUNTER — Ambulatory Visit: Admitting: Podiatry
